# Patient Record
Sex: Female | Born: 1970 | Race: Black or African American | Hispanic: No | Marital: Single | Smoking: Current every day smoker
Health system: Southern US, Community
[De-identification: ages and names within clinical notes are randomized; demographics above are authoritative.]

## PROBLEM LIST (undated history)

## (undated) DIAGNOSIS — I350 Nonrheumatic aortic (valve) stenosis: Secondary | ICD-10-CM

## (undated) DIAGNOSIS — R609 Edema, unspecified: Secondary | ICD-10-CM

## (undated) DIAGNOSIS — I359 Nonrheumatic aortic valve disorder, unspecified: Secondary | ICD-10-CM

## (undated) DIAGNOSIS — B351 Tinea unguium: Secondary | ICD-10-CM

## (undated) DIAGNOSIS — I10 Essential (primary) hypertension: Secondary | ICD-10-CM

## (undated) DIAGNOSIS — E119 Type 2 diabetes mellitus without complications: Secondary | ICD-10-CM

## (undated) HISTORY — DX: Edema, unspecified: R60.9

## (undated) HISTORY — DX: Tinea unguium: B35.1

## (undated) HISTORY — DX: Nonrheumatic aortic valve disorder, unspecified: I35.9

## (undated) HISTORY — DX: Nonrheumatic aortic (valve) stenosis: I35.0

## (undated) HISTORY — DX: Morbid (severe) obesity due to excess calories: E66.01

## (undated) HISTORY — DX: Type 2 diabetes mellitus without complications: E11.9

## (undated) HISTORY — DX: Essential (primary) hypertension: I10

---

## 2000-08-25 ENCOUNTER — Emergency Department (HOSPITAL_COMMUNITY): Admission: EM | Admit: 2000-08-25 | Discharge: 2000-08-25 | Payer: Self-pay | Admitting: *Deleted

## 2000-08-26 ENCOUNTER — Emergency Department (HOSPITAL_COMMUNITY): Admission: EM | Admit: 2000-08-26 | Discharge: 2000-08-26 | Payer: Self-pay | Admitting: *Deleted

## 2000-10-12 ENCOUNTER — Encounter: Payer: Self-pay | Admitting: *Deleted

## 2000-10-12 ENCOUNTER — Emergency Department (HOSPITAL_COMMUNITY): Admission: EM | Admit: 2000-10-12 | Discharge: 2000-10-12 | Payer: Self-pay | Admitting: *Deleted

## 2001-07-04 ENCOUNTER — Encounter (HOSPITAL_COMMUNITY): Admission: RE | Admit: 2001-07-04 | Discharge: 2001-08-03 | Payer: Self-pay | Admitting: Internal Medicine

## 2001-07-18 ENCOUNTER — Ambulatory Visit (HOSPITAL_COMMUNITY): Admission: RE | Admit: 2001-07-18 | Discharge: 2001-07-18 | Payer: Self-pay | Admitting: Otolaryngology

## 2001-07-18 ENCOUNTER — Encounter: Payer: Self-pay | Admitting: Otolaryngology

## 2001-08-02 ENCOUNTER — Encounter: Payer: Self-pay | Admitting: Otolaryngology

## 2001-08-05 ENCOUNTER — Encounter (HOSPITAL_COMMUNITY): Admission: RE | Admit: 2001-08-05 | Discharge: 2001-09-04 | Payer: Self-pay | Admitting: Internal Medicine

## 2001-08-07 ENCOUNTER — Encounter (INDEPENDENT_AMBULATORY_CARE_PROVIDER_SITE_OTHER): Payer: Self-pay | Admitting: *Deleted

## 2001-08-07 ENCOUNTER — Ambulatory Visit (HOSPITAL_COMMUNITY): Admission: RE | Admit: 2001-08-07 | Discharge: 2001-08-08 | Payer: Self-pay | Admitting: Otolaryngology

## 2004-03-30 ENCOUNTER — Emergency Department (HOSPITAL_COMMUNITY): Admission: EM | Admit: 2004-03-30 | Discharge: 2004-03-30 | Payer: Self-pay | Admitting: *Deleted

## 2005-12-29 ENCOUNTER — Ambulatory Visit: Payer: Self-pay | Admitting: Family Medicine

## 2006-01-02 ENCOUNTER — Encounter: Payer: Self-pay | Admitting: Family Medicine

## 2006-01-12 ENCOUNTER — Ambulatory Visit: Payer: Self-pay | Admitting: Family Medicine

## 2006-01-26 ENCOUNTER — Ambulatory Visit: Payer: Self-pay | Admitting: Family Medicine

## 2006-01-26 LAB — CONVERTED CEMR LAB
BUN: 19 mg/dL (ref 6–23)
CO2: 19 meq/L (ref 19–32)
Calcium: 9.4 mg/dL (ref 8.4–10.5)
Chloride: 100 meq/L (ref 96–112)
Creatinine, Ser: 0.59 mg/dL (ref 0.40–1.20)
Glucose, Bld: 115 mg/dL — ABNORMAL HIGH (ref 70–99)
Potassium: 4.1 meq/L (ref 3.5–5.3)
Sodium: 135 meq/L (ref 135–145)

## 2006-02-23 ENCOUNTER — Ambulatory Visit: Payer: Self-pay | Admitting: Family Medicine

## 2006-02-24 ENCOUNTER — Encounter (INDEPENDENT_AMBULATORY_CARE_PROVIDER_SITE_OTHER): Payer: Self-pay | Admitting: Family Medicine

## 2006-02-24 LAB — CONVERTED CEMR LAB
Creatinine, Urine: 89.3 mg/dL
Microalb Creat Ratio: 8.7 mg/g (ref 0.0–30.0)
Microalb, Ur: 0.78 mg/dL (ref 0.00–1.89)

## 2006-03-05 ENCOUNTER — Ambulatory Visit: Payer: Self-pay | Admitting: Family Medicine

## 2006-03-05 ENCOUNTER — Telehealth (INDEPENDENT_AMBULATORY_CARE_PROVIDER_SITE_OTHER): Payer: Self-pay | Admitting: Family Medicine

## 2006-03-05 DIAGNOSIS — I152 Hypertension secondary to endocrine disorders: Secondary | ICD-10-CM

## 2006-03-05 DIAGNOSIS — B351 Tinea unguium: Secondary | ICD-10-CM | POA: Insufficient documentation

## 2006-03-05 DIAGNOSIS — E1159 Type 2 diabetes mellitus with other circulatory complications: Secondary | ICD-10-CM

## 2006-03-05 DIAGNOSIS — E1121 Type 2 diabetes mellitus with diabetic nephropathy: Secondary | ICD-10-CM

## 2006-03-05 HISTORY — DX: Type 2 diabetes mellitus with other circulatory complications: E11.59

## 2006-03-05 HISTORY — DX: Hypertension secondary to endocrine disorders: I15.2

## 2006-03-15 ENCOUNTER — Ambulatory Visit: Payer: Self-pay | Admitting: Family Medicine

## 2006-03-26 ENCOUNTER — Ambulatory Visit: Payer: Self-pay | Admitting: Internal Medicine

## 2006-04-03 ENCOUNTER — Encounter (INDEPENDENT_AMBULATORY_CARE_PROVIDER_SITE_OTHER): Payer: Self-pay | Admitting: Family Medicine

## 2006-04-03 ENCOUNTER — Ambulatory Visit: Payer: Self-pay | Admitting: Internal Medicine

## 2006-04-03 ENCOUNTER — Ambulatory Visit (HOSPITAL_COMMUNITY): Admission: RE | Admit: 2006-04-03 | Discharge: 2006-04-03 | Payer: Self-pay | Admitting: Internal Medicine

## 2006-04-11 ENCOUNTER — Encounter (INDEPENDENT_AMBULATORY_CARE_PROVIDER_SITE_OTHER): Payer: Self-pay | Admitting: Family Medicine

## 2006-04-17 ENCOUNTER — Encounter (INDEPENDENT_AMBULATORY_CARE_PROVIDER_SITE_OTHER): Payer: Self-pay | Admitting: Family Medicine

## 2006-04-24 ENCOUNTER — Ambulatory Visit: Payer: Self-pay | Admitting: Family Medicine

## 2006-04-24 DIAGNOSIS — I10 Essential (primary) hypertension: Secondary | ICD-10-CM | POA: Insufficient documentation

## 2006-04-24 LAB — CONVERTED CEMR LAB
Glucose, Bld: 131 mg/dL
Hgb A1c MFr Bld: 6.1 %

## 2006-05-02 ENCOUNTER — Ambulatory Visit: Payer: Self-pay | Admitting: Internal Medicine

## 2006-05-02 ENCOUNTER — Encounter (INDEPENDENT_AMBULATORY_CARE_PROVIDER_SITE_OTHER): Payer: Self-pay | Admitting: Family Medicine

## 2006-07-24 ENCOUNTER — Ambulatory Visit: Payer: Self-pay | Admitting: Family Medicine

## 2006-07-24 DIAGNOSIS — R609 Edema, unspecified: Secondary | ICD-10-CM

## 2006-07-24 LAB — CONVERTED CEMR LAB
Cholesterol, target level: 200 mg/dL
Glucose, Bld: 147 mg/dL
HDL goal, serum: 40 mg/dL
Hgb A1c MFr Bld: 6.6 %
LDL Goal: 100 mg/dL

## 2006-07-25 ENCOUNTER — Telehealth (INDEPENDENT_AMBULATORY_CARE_PROVIDER_SITE_OTHER): Payer: Self-pay | Admitting: *Deleted

## 2006-07-25 ENCOUNTER — Encounter (INDEPENDENT_AMBULATORY_CARE_PROVIDER_SITE_OTHER): Payer: Self-pay | Admitting: Family Medicine

## 2006-07-25 LAB — CONVERTED CEMR LAB
BUN: 17 mg/dL (ref 6–23)
Calcium: 9.6 mg/dL (ref 8.4–10.5)
Potassium: 4.1 meq/L (ref 3.5–5.3)
Sodium: 140 meq/L (ref 135–145)

## 2006-07-31 ENCOUNTER — Ambulatory Visit (HOSPITAL_COMMUNITY): Admission: RE | Admit: 2006-07-31 | Discharge: 2006-07-31 | Payer: Self-pay | Admitting: Family Medicine

## 2006-08-01 ENCOUNTER — Telehealth (INDEPENDENT_AMBULATORY_CARE_PROVIDER_SITE_OTHER): Payer: Self-pay | Admitting: *Deleted

## 2006-08-01 ENCOUNTER — Encounter (INDEPENDENT_AMBULATORY_CARE_PROVIDER_SITE_OTHER): Payer: Self-pay | Admitting: Family Medicine

## 2007-06-12 ENCOUNTER — Telehealth (INDEPENDENT_AMBULATORY_CARE_PROVIDER_SITE_OTHER): Payer: Self-pay | Admitting: *Deleted

## 2007-07-11 ENCOUNTER — Ambulatory Visit: Payer: Self-pay | Admitting: Family Medicine

## 2007-07-11 DIAGNOSIS — K056 Periodontal disease, unspecified: Secondary | ICD-10-CM | POA: Insufficient documentation

## 2007-07-11 DIAGNOSIS — K069 Disorder of gingiva and edentulous alveolar ridge, unspecified: Secondary | ICD-10-CM

## 2007-07-11 LAB — CONVERTED CEMR LAB
Bilirubin Urine: NEGATIVE
Blood Glucose, Fasting: 436 mg/dL
Blood in Urine, dipstick: NEGATIVE
Hgb A1c MFr Bld: 11.2 %
Protein, U semiquant: NEGATIVE
Specific Gravity, Urine: 1.01
Urobilinogen, UA: 0.2
WBC Urine, dipstick: NEGATIVE

## 2007-07-12 LAB — CONVERTED CEMR LAB
ALT: 25 units/L (ref 0–35)
AST: 24 units/L (ref 0–37)
Alkaline Phosphatase: 94 units/L (ref 39–117)
BUN: 10 mg/dL (ref 6–23)
Basophils Absolute: 0 10*3/uL (ref 0.0–0.1)
Basophils Relative: 1 % (ref 0–1)
Calcium: 9.9 mg/dL (ref 8.4–10.5)
Creatinine, Ser: 0.7 mg/dL (ref 0.40–1.20)
Creatinine, Urine: 54.3 mg/dL
Eosinophils Absolute: 0.1 10*3/uL (ref 0.0–0.7)
Eosinophils Relative: 1 % (ref 0–5)
HDL: 52 mg/dL (ref >39)
Hemoglobin: 13 g/dL (ref 12.0–15.0)
LDL Cholesterol: 96 mg/dL (ref 0–99)
MCHC: 33 g/dL (ref 30.0–36.0)
MCV: 86 fL (ref 78.0–100.0)
Monocytes Absolute: 0.4 10*3/uL (ref 0.1–1.0)
Monocytes Relative: 7 % (ref 3–12)
Neutro Abs: 4 10*3/uL (ref 1.7–7.7)
RBC: 4.58 M/uL (ref 3.87–5.11)
RDW: 13.6 % (ref 11.5–15.5)
TSH: 1.394 microintl units/mL (ref 0.350–5.50)
Total Bilirubin: 0.5 mg/dL (ref 0.3–1.2)
Total CHOL/HDL Ratio: 3.3
VLDL: 22 mg/dL (ref 0–40)

## 2007-07-18 ENCOUNTER — Ambulatory Visit: Payer: Self-pay | Admitting: Family Medicine

## 2007-07-18 DIAGNOSIS — N912 Amenorrhea, unspecified: Secondary | ICD-10-CM

## 2007-07-18 LAB — CONVERTED CEMR LAB
BUN: 27 mg/dL — ABNORMAL HIGH (ref 6–23)
Beta hcg, urine, semiquantitative: NEGATIVE
Creatinine, Ser: 1.12 mg/dL (ref 0.40–1.20)
Glucose, Bld: 495 mg/dL — ABNORMAL HIGH (ref 70–99)
Ketones, urine, test strip: NEGATIVE
Nitrite: NEGATIVE
Potassium: 4.1 meq/L (ref 3.5–5.3)
Specific Gravity, Urine: 1.01
Urobilinogen, UA: 0.2

## 2007-07-22 ENCOUNTER — Telehealth (INDEPENDENT_AMBULATORY_CARE_PROVIDER_SITE_OTHER): Payer: Self-pay | Admitting: Family Medicine

## 2007-07-23 ENCOUNTER — Encounter (INDEPENDENT_AMBULATORY_CARE_PROVIDER_SITE_OTHER): Payer: Self-pay | Admitting: Family Medicine

## 2007-07-25 ENCOUNTER — Ambulatory Visit: Payer: Self-pay | Admitting: Family Medicine

## 2007-07-25 LAB — CONVERTED CEMR LAB: Glucose, Bld: 410 mg/dL

## 2007-08-22 ENCOUNTER — Ambulatory Visit: Payer: Self-pay | Admitting: Family Medicine

## 2007-08-22 DIAGNOSIS — K649 Unspecified hemorrhoids: Secondary | ICD-10-CM | POA: Insufficient documentation

## 2007-08-22 DIAGNOSIS — R809 Proteinuria, unspecified: Secondary | ICD-10-CM | POA: Insufficient documentation

## 2007-08-29 LAB — CONVERTED CEMR LAB
BUN: 31 mg/dL — ABNORMAL HIGH (ref 6–23)
Chloride: 106 meq/L (ref 96–112)

## 2007-10-03 ENCOUNTER — Ambulatory Visit: Payer: Self-pay | Admitting: Family Medicine

## 2007-10-03 LAB — CONVERTED CEMR LAB: Hgb A1c MFr Bld: 7.7 %

## 2007-10-03 LAB — HM DIABETES EYE EXAM

## 2008-02-12 ENCOUNTER — Encounter (INDEPENDENT_AMBULATORY_CARE_PROVIDER_SITE_OTHER): Payer: Self-pay | Admitting: Family Medicine

## 2008-10-12 ENCOUNTER — Ambulatory Visit: Payer: Self-pay | Admitting: Family Medicine

## 2008-10-12 LAB — CONVERTED CEMR LAB
Blood Glucose, Fasting: 392 mg/dL
Hgb A1c MFr Bld: 12.4 %

## 2008-10-13 ENCOUNTER — Encounter (INDEPENDENT_AMBULATORY_CARE_PROVIDER_SITE_OTHER): Payer: Self-pay | Admitting: Family Medicine

## 2009-04-05 ENCOUNTER — Ambulatory Visit: Payer: Self-pay | Admitting: Family Medicine

## 2009-04-05 DIAGNOSIS — R5381 Other malaise: Secondary | ICD-10-CM | POA: Insufficient documentation

## 2009-04-05 DIAGNOSIS — B369 Superficial mycosis, unspecified: Secondary | ICD-10-CM | POA: Insufficient documentation

## 2009-04-05 DIAGNOSIS — G4733 Obstructive sleep apnea (adult) (pediatric): Secondary | ICD-10-CM | POA: Insufficient documentation

## 2009-04-05 DIAGNOSIS — R5383 Other fatigue: Secondary | ICD-10-CM

## 2009-04-05 DIAGNOSIS — G4730 Sleep apnea, unspecified: Secondary | ICD-10-CM

## 2009-04-05 LAB — CONVERTED CEMR LAB: Glucose, Bld: 210 mg/dL

## 2009-04-06 ENCOUNTER — Encounter: Payer: Self-pay | Admitting: Family Medicine

## 2009-04-06 LAB — CONVERTED CEMR LAB
Creatinine, Urine: 93.4 mg/dL
Microalb, Ur: 0.5 mg/dL (ref 0.00–1.89)

## 2009-04-07 ENCOUNTER — Encounter: Payer: Self-pay | Admitting: Family Medicine

## 2009-04-07 LAB — CONVERTED CEMR LAB
ALT: 12 units/L (ref 0–35)
Albumin: 4 g/dL (ref 3.5–5.2)
Basophils Absolute: 0 10*3/uL (ref 0.0–0.1)
Chloride: 102 meq/L (ref 96–112)
Cholesterol: 173 mg/dL (ref 0–200)
HCT: 35.1 % — ABNORMAL LOW (ref 36.0–46.0)
HDL: 44 mg/dL (ref >39)
LDL Cholesterol: 113 mg/dL — ABNORMAL HIGH (ref 0–99)
Lymphocytes Relative: 28 % (ref 12–46)
Lymphs Abs: 2.3 10*3/uL (ref 0.7–4.0)
Neutro Abs: 4.9 10*3/uL (ref 1.7–7.7)
Neutrophils Relative %: 61 % (ref 43–77)
Platelets: 311 10*3/uL (ref 150–400)
Potassium: 4.3 meq/L (ref 3.5–5.3)
RDW: 14.1 % (ref 11.5–15.5)
Sodium: 138 meq/L (ref 135–145)
Total Protein: 7.8 g/dL (ref 6.0–8.3)
Triglycerides: 78 mg/dL (ref <150)
VLDL: 16 mg/dL (ref 0–40)
WBC: 8 10*3/uL (ref 4.0–10.5)

## 2009-04-12 ENCOUNTER — Ambulatory Visit (HOSPITAL_COMMUNITY): Admission: RE | Admit: 2009-04-12 | Discharge: 2009-04-12 | Payer: Self-pay | Admitting: Family Medicine

## 2009-04-14 ENCOUNTER — Encounter: Payer: Self-pay | Admitting: Family Medicine

## 2009-04-21 ENCOUNTER — Encounter: Payer: Self-pay | Admitting: Family Medicine

## 2009-05-03 ENCOUNTER — Encounter (INDEPENDENT_AMBULATORY_CARE_PROVIDER_SITE_OTHER): Payer: Self-pay | Admitting: Cardiology

## 2009-05-03 ENCOUNTER — Encounter (HOSPITAL_COMMUNITY): Admission: RE | Admit: 2009-05-03 | Discharge: 2009-06-02 | Payer: Self-pay | Admitting: Cardiology

## 2009-05-06 ENCOUNTER — Ambulatory Visit: Payer: Self-pay | Admitting: Family Medicine

## 2009-05-06 DIAGNOSIS — E119 Type 2 diabetes mellitus without complications: Secondary | ICD-10-CM

## 2009-05-06 DIAGNOSIS — E785 Hyperlipidemia, unspecified: Secondary | ICD-10-CM | POA: Insufficient documentation

## 2009-05-06 LAB — CONVERTED CEMR LAB: Blood Glucose, Fasting: 144 mg/dL

## 2009-05-11 ENCOUNTER — Encounter: Payer: Self-pay | Admitting: Family Medicine

## 2009-07-09 ENCOUNTER — Encounter: Payer: Self-pay | Admitting: Family Medicine

## 2009-07-12 LAB — CONVERTED CEMR LAB: Hgb A1c MFr Bld: 6.7 % — ABNORMAL HIGH (ref <5.7)

## 2009-07-15 ENCOUNTER — Other Ambulatory Visit: Admission: RE | Admit: 2009-07-15 | Discharge: 2009-07-15 | Payer: Self-pay | Admitting: Family Medicine

## 2009-07-15 ENCOUNTER — Ambulatory Visit: Payer: Self-pay | Admitting: Family Medicine

## 2009-07-15 LAB — HM DIABETES FOOT EXAM

## 2009-07-21 ENCOUNTER — Encounter: Payer: Self-pay | Admitting: Family Medicine

## 2009-07-21 LAB — CONVERTED CEMR LAB: Pap Smear: NEGATIVE

## 2009-09-29 ENCOUNTER — Ambulatory Visit: Payer: Self-pay | Admitting: Family Medicine

## 2009-09-29 LAB — CONVERTED CEMR LAB
ALT: 12 units/L (ref 0–35)
BUN: 28 mg/dL — ABNORMAL HIGH (ref 6–23)
Bilirubin, Direct: 0.1 mg/dL (ref 0.0–0.3)
CO2: 23 meq/L (ref 19–32)
Eosinophils Absolute: 0.2 10*3/uL (ref 0.0–0.7)
Glucose, Bld: 81 mg/dL (ref 70–99)
HCT: 33.9 % — ABNORMAL LOW (ref 36.0–46.0)
Hgb A1c MFr Bld: 6.7 % — ABNORMAL HIGH (ref <5.7)
Indirect Bilirubin: 0.4 mg/dL (ref 0.0–0.9)
LDL Cholesterol: 123 mg/dL — ABNORMAL HIGH (ref 0–99)
Lymphocytes Relative: 37 % (ref 12–46)
Lymphs Abs: 2.8 10*3/uL (ref 0.7–4.0)
MCV: 89.4 fL (ref 78.0–100.0)
Monocytes Relative: 7 % (ref 3–12)
Neutrophils Relative %: 53 % (ref 43–77)
Platelets: 358 10*3/uL (ref 150–400)
Potassium: 4.7 meq/L (ref 3.5–5.3)
RBC: 3.79 M/uL — ABNORMAL LOW (ref 3.87–5.11)
Sodium: 139 meq/L (ref 135–145)
Total Bilirubin: 0.5 mg/dL (ref 0.3–1.2)
Total CHOL/HDL Ratio: 3.7
VLDL: 15 mg/dL (ref 0–40)
WBC: 7.5 10*3/uL (ref 4.0–10.5)

## 2009-12-26 LAB — CONVERTED CEMR LAB
AST: 11 units/L (ref 0–37)
Albumin: 3.9 g/dL (ref 3.5–5.2)
CO2: 22 meq/L (ref 19–32)
Calcium: 9.3 mg/dL (ref 8.4–10.5)
Chloride: 105 meq/L (ref 96–112)
Glucose, Bld: 188 mg/dL — ABNORMAL HIGH (ref 70–99)
HDL: 48 mg/dL (ref >39)
Hgb A1c MFr Bld: 9.1 % — ABNORMAL HIGH (ref <5.7)
Potassium: 4.5 meq/L (ref 3.5–5.3)
Sodium: 139 meq/L (ref 135–145)
Total Bilirubin: 0.5 mg/dL (ref 0.3–1.2)
Total CHOL/HDL Ratio: 3.4
VLDL: 15 mg/dL (ref 0–40)

## 2009-12-29 ENCOUNTER — Ambulatory Visit: Payer: Self-pay | Admitting: Family Medicine

## 2010-02-22 NOTE — Assessment & Plan Note (Signed)
Summary: physical   Vital Signs:  Patient profile:   40 year old female Menstrual status:  irregular Height:      64 inches Weight:      362 pounds BMI:     62.36 O2 Sat:      95 % Pulse rate:   88 / minute Pulse rhythm:   regular Resp:     16 per minute BP sitting:   108 / 70  (left arm) Cuff size:   xl  Vitals Entered By: Everitt Amber LPN (July 15, 2009 8:53 AM)  Nutrition Counseling: Patient's BMI is greater than 25 and therefore counseled on weight management options. CC: CPE    Primary Care Provider:  Syliva Overman MD  CC:  CPE .  History of Present Illness: Reports  that she has been  doing well. Denies recent fever or chills. Denies sinus pressure, nasal congestion , ear pain or sore throat. Denies chest congestion, or cough productive of sputum. Denies chest pain, palpitations, PND, orthopnea or leg swelling. Denies abdominal pain, nausea, vomitting, diarrhea or constipation. Denies change in bowel movements or bloody stool. Denies dysuria , frequency, incontinence or hesitancy. Denies  joint pain, swelling, or reduced mobility. Denies headaches, vertigo, seizures. Denies depression, anxiety or insomnia. Denies  rash, lesions, or itch. She has been testing her sugars daily and reports marked improvement, wit tolerance to her meds and no hypoglycemic episodes. She ahs also modified her diet with a n impressive weight loss. Regular exercise needs to be adopted     Current Medications (verified): 1)  Lisinopril 40 Mg  Tabs (Lisinopril) .... One Daily 2)  Hydrochlorothiazide 25 Mg  Tabs (Hydrochlorothiazide) .... One Daily 3)  Ascensia Autodisk Glusose Strips With Supplies .... Test Fsbs Once Daily 4)  Janumet 50-1000 Mg Tabs (Sitagliptin-Metformin Hcl) .... Take 1 Tablet By Mouth Two Times A Day 5)  Clotrimazole-Betamethasone 1-0.05 % Crea (Clotrimazole-Betamethasone) .... Apply Twice Daily To Affected Areas 6)  Lancets and Strips .... Two Times A Day  Testing 7)  Lovastatin 40 Mg Tabs (Lovastatin) .... Take 1 Tab By Mouth At Bedtime 8)  Glimepiride 4 Mg Tabs (Glimepiride) .... Take 1 Tablet By Mouth Two Times A Day 9)  Slow Release Iron 160 (50 Fe) Mg Cr-Tabs (Ferrous Sulfate Dried) .... Take 1 Tablet By Mouth Once A Day  Allergies (verified): No Known Drug Allergies  Review of Systems      See HPI Eyes:  Denies blurring and discharge. Endo:  Denies cold intolerance, excessive hunger, excessive thirst, excessive urination, heat intolerance, polyuria, and weight change. Heme:  Denies abnormal bruising and bleeding. Allergy:  Denies hives or rash and itching eyes.  Physical Exam  General:  Well-developed,morbidly obese,in no acute distress; alert,appropriate and cooperative throughout examination Head:  Normocephalic and atraumatic without obvious abnormalities. No apparent alopecia or balding. Eyes:  No corneal or conjunctival inflammation noted. EOMI. Perrla. Funduscopic exam benign, without hemorrhages, exudates or papilledema. Vision grossly normal. Ears:  External ear exam shows no significant lesions or deformities.  Otoscopic examination reveals clear canals, tympanic membranes are intact bilaterally without bulging, retraction, inflammation or discharge. Hearing is grossly normal bilaterally. Nose:  External nasal examination shows no deformity or inflammation. Nasal mucosa are pink and moist without lesions or exudates. Mouth:  Oral mucosa and oropharynx without lesions or exudates.  Teeth in good repair. Neck:  No deformities, masses, or tenderness noted. Chest Wall:  No deformities, masses, or tenderness noted. Breasts:  No mass, nodules, thickening, tenderness,  bulging, retraction, inflamation, nipple discharge or skin changes noted.   Lungs:  Normal respiratory effort, chest expands symmetrically. Lungs are clear to auscultation, no crackles or wheezes. Heart:  Normal rate and regular rhythm. S1 and S2 normal without gallop,  murmur, click, rub or other extra sounds. Abdomen:  Bowel sounds positive,abdomen soft and non-tender without masses, organomegaly or hernias noted. Rectal:  No external abnormalities noted. Normal sphincter tone. No rectal masses or tenderness.Guaic neg stool Genitalia:  Normal introitus for age, no external lesions, no vaginal discharge, mucosa pink and moist, no vaginal or cervical lesions, no vaginal atrophy, no friaility or hemorrhage, normal uterus size and position, no adnexal masses or tenderness Msk:  No deformity or scoliosis noted of thoracic or lumbar spine.   Pulses:  R and L carotid,radial,femoral,dorsalis pedis and posterior tibial pulses are full and equal bilaterally Extremities:  No clubbing, cyanosis, edema, or deformity noted with normal full range of motion of all joints.   Neurologic:  No cranial nerve deficits noted. Station and gait are normal. Plantar reflexes are down-going bilaterally. DTRs are symmetrical throughout. Sensory, motor and coordinative functions appear intact.  Diabetes Management Exam:    Foot Exam (with socks and/or shoes not present):       Sensory-Monofilament:          Left foot: normal          Right foot: normal       Inspection:          Left foot: normal          Right foot: normal       Nails:          Left foot: thickened          Right foot: thickened   Impression & Recommendations:  Problem # 1:  DIABETES MELLITUS, TYPE II (ICD-250.00) Assessment Improved  Her updated medication list for this problem includes:    Lisinopril 40 Mg Tabs (Lisinopril) ..... One daily    Janumet 50-1000 Mg Tabs (Sitagliptin-metformin hcl) .Marland Kitchen... Take 1 tablet by mouth two times a day    Glimepiride 4 Mg Tabs (Glimepiride) .Marland Kitchen... Take 1 tablet by mouth two times a day  Orders: T- Hemoglobin A1C ([id number])  Labs Reviewed: Creat: 0.73 (04/06/2009)     Last Eye Exam: Advised (10/03/2007) Reviewed HgBA1c results: 6.7 (07/09/2009)  8.6  (04/07/2009)  Problem # 2:  HYPERLIPIDEMIA (ICD-272.4) Assessment: Comment Only  Her updated medication list for this problem includes:    Lovastatin 40 Mg Tabs (Lovastatin) .Marland Kitchen... Take 1 tab by mouth at bedtime  Orders: T-Hepatic Function [id number]) T-Lipid Profile [id number])  Labs Reviewed: SGOT: 12 (04/06/2009)   SGPT: 12 (04/06/2009)  Lipid Goals: Chol Goal: 200 (07/24/2006)   HDL Goal: 40 (07/24/2006)   LDL Goal: 100 (07/24/2006)   TG Goal: 150 (07/24/2006)  Prior 10 Yr Risk Heart Disease: 2 % (07/18/2007)   HDL:44 (04/06/2009), 52 (07/11/2007)  LDL:113 (04/06/2009), 96 (57/84/6962)  Chol:173 (04/06/2009), 170 (07/11/2007)  Trig:78 (04/06/2009), 109 (07/11/2007)  Problem # 3:  ESSENTIAL HYPERTENSION (ICD-401.9) Assessment: Improved  Her updated medication list for this problem includes:    Lisinopril 40 Mg Tabs (Lisinopril) ..... One daily    Hydrochlorothiazide 25 Mg Tabs (Hydrochlorothiazide) ..... One daily  Orders: T-Basic Metabolic Panel [id number])  BP today: 108/70 Prior BP: 118/72 (05/06/2009)  Prior 10 Yr Risk Heart Disease: 2 % (07/18/2007)  Labs Reviewed: K+: 4.3 (04/06/2009) Creat: : 0.73 (04/06/2009)   Chol: 173 (04/06/2009)   HDL:  44 (04/06/2009)   LDL: 113 (04/06/2009)   TG: 78 (04/06/2009)  Problem # 4:  MORBID OBESITY (ICD-278.01) Assessment: Improved  Ht: 64 (07/15/2009)   Wt: 362 (07/15/2009)   BMI: 62.36 (07/15/2009)  Complete Medication List: 1)  Lisinopril 40 Mg Tabs (Lisinopril) .... One daily 2)  Hydrochlorothiazide 25 Mg Tabs (Hydrochlorothiazide) .... One daily 3)  Ascensia Autodisk Glusose Strips With [employer]  .... Test fsbs once daily 4)  Janumet 50-1000 Mg Tabs (Sitagliptin-metformin hcl) .... Take 1 tablet by mouth two times a day 5)  Clotrimazole-betamethasone 1-0.05 % Crea (Clotrimazole-betamethasone) .... Apply twice daily to affected areas 6)  Lancets and Strips  .... Two times a day testing 7)  Lovastatin 40 Mg Tabs  (Lovastatin) .... Take 1 tab by mouth at bedtime 8)  Glimepiride 4 Mg Tabs (Glimepiride) .... Take 1 tablet by mouth two times a day 9)  Slow Release Iron 160 (50 Fe) Mg Cr-tabs (Ferrous sulfate dried) .... Take 1 tablet by mouth once a day  Other Orders: T-CBC w/Diff ([id number]) Pelvic & Breast Exam ( Medicare)  (G0101) Hemoccult Guaiac-1 spec.(in office) (09811)  Patient Instructions: 1)  Please schedule a follow-up appointment in 3 months. 2)  ' 3)  CONGRATS 4)  BMP prior to visit, ICD-9: 5)  Hepatic Panel prior to visit, ICD-9: 6)  Lipid Panel prior to visit, ICD-9:  fastinfg in 3 months 7)  CBC w/ Diff prior to visit, ICD-9: 8)  HbgA1C prior to visit, ICD-9: 9)  No med changes 10)  It is important that you exercise regularly at least 30 minutes 5 times a week. If you develop chest pain, have severe difficulty breathing, or feel very tired , stop exercising immediately and seek medical attention. 11)  You need to lose weight. Consider a lower calorie diet and regular exercise.  12)  Check your blood sugars regularly. If your readings are usually above : or below 70 you should contact our office. Prescriptions: JANUMET 50-1000 MG TABS (SITAGLIPTIN-METFORMIN HCL) Take 1 tablet by mouth two times a day  #60 x 4   Entered by:   Everitt Amber LPN   Authorized by:   Syliva Overman MD   Signed by:   Everitt Amber LPN on 91/47/8295   Method used:   Electronically to        [facility]  [address] (retail)       [address]       [place], [place]  [address]       Ph: [phone]       Fax: [phone]   RxID:   [id number] LISINOPRIL 40 MG  TABS (LISINOPRIL) One daily  #30 x 4   Entered by:   Everitt Amber LPN   Authorized by:   Syliva Overman MD   Signed by:   Everitt Amber LPN on 42/59/5638   Method used:   Electronically to        [facility]  [address] (retail)       [address]       [place], [place]  [address]       Ph: [phone]        Fax: [phone]   RxID:   [id number]   Laboratory Results    Stool - Occult Blood Hemmoccult #1: negative Date: 07/15/2009 Comments: [id number] 9r 8/11 18 1012

## 2010-02-22 NOTE — Letter (Signed)
Summary: Letter  Letter   Imported By: Lind Guest 04/21/2009 14:29:50  _____________________________________________________________________  External Attachment:    Type:   Image     Comment:   External Document

## 2010-02-22 NOTE — Assessment & Plan Note (Signed)
Summary: office visit   Vital Signs:  Patient profile:   40 year old female Menstrual status:  irregular Height:      64 inches Weight:      366 pounds BMI:     63.05 O2 Sat:      98 % Pulse rate:   80 / minute Pulse rhythm:   regular Resp:     16 per minute BP sitting:   118 / 74  (left arm) Cuff size:   xl  Vitals Entered By: Everitt Amber LPN (September 29, 2009 8:55 AM)  Nutrition Counseling: Patient's BMI is greater than 25 and therefore counseled on weight management options. CC: Folllow up chronic problems   Primary Care Provider:  Syliva Overman MD  CC:  Folllow up chronic problems.  History of Present Illness: Reports  that she has been  doing well.She has been working extra shifts and is tired, her mother has been ill, but is doing better. Denies recent fever or chills. Denies sinus pressure, nasal congestion , ear pain or sore throat. Denies chest congestion, or cough productive of sputum. Denies chest pain, palpitations, PND, orthopnea or leg swelling. Denies abdominal pain, nausea, vomitting, diarrhea or constipation. Denies change in bowel movements or bloody stool. Denies dysuria , frequency, incontinence or hesitancy. Denies  joint pain, swelling, or reduced mobility. Denies headaches, vertigo, seizures. Denies depression, anxiety or insomnia. Denies  rash, lesions, or itch.     Current Medications (verified): 1)  Lisinopril 40 Mg  Tabs (Lisinopril) .... One Daily 2)  Hydrochlorothiazide 25 Mg  Tabs (Hydrochlorothiazide) .... One Daily 3)  Ascensia Autodisk Glusose Strips With Supplies .... Test Fsbs Once Daily 4)  Janumet 50-1000 Mg Tabs (Sitagliptin-Metformin Hcl) .... Take 1 Tablet By Mouth Two Times A Day 5)  Clotrimazole-Betamethasone 1-0.05 % Crea (Clotrimazole-Betamethasone) .... Apply Twice Daily To Affected Areas 6)  Lancets and Strips .... Two Times A Day Testing 7)  Lovastatin 40 Mg Tabs (Lovastatin) .... Take 1 Tab By Mouth At Bedtime 8)   Glimepiride 4 Mg Tabs (Glimepiride) .... Take 1 Tablet By Mouth Two Times A Day 9)  Slow Release Iron 160 (50 Fe) Mg Cr-Tabs (Ferrous Sulfate Dried) .... Take 1 Tablet By Mouth Once A Day  Allergies (verified): No Known Drug Allergies  Review of Systems      See HPI General:  Complains of fatigue. Eyes:  Denies blurring, discharge, eye pain, and red eye. MS:  Complains of joint pain; bilateral kn ee pain after a long period ofstanding. Endo:  Denies excessive thirst and excessive urination; fasting sugars are seldom over 130. Heme:  Denies abnormal bruising and bleeding. Allergy:  Denies hives or rash and itching eyes.  Physical Exam  General:  Well-developed,morbidly obese,in no acute distress; alert,appropriate and cooperative throughout examination HEENT: No facial asymmetry,  EOMI, No sinus tenderness, TM's Clear, oropharynx  pink and moist.   Chest: Clear to auscultation bilaterally.  CVS: S1, S2, No murmurs, No S3.   Abd: Soft, Nontender.  MS: Adequate ROM spine, hips, shoulders and knees.  Ext: No edema.   CNS: CN 2-12 intact, power tone and sensation normal throughout.   Skin: Intact, no visible lesions or rashes.  Psych: Good eye contact, normal affect.  Memory intact, not anxious or depressed appearing.    Impression & Recommendations:  Problem # 1:  DIABETES MELLITUS, TYPE II (ICD-250.00) Assessment Unchanged  Her updated medication list for this problem includes:    Lisinopril 40 Mg Tabs (Lisinopril) .Marland KitchenMarland KitchenMarland KitchenMarland Kitchen  One daily    Janumet 50-1000 Mg Tabs (Sitagliptin-metformin hcl) .Marland Kitchen... Take 1 tablet by mouth two times a day    Glimepiride 4 Mg Tabs (Glimepiride) .Marland Kitchen... Take 1 tablet by mouth two times a day  Labs Reviewed: Creat: 1.16 (09/24/2009)     Last Eye Exam: Advised (10/03/2007) Reviewed HgBA1c results: 6.7 (09/24/2009)  6.7 (07/09/2009)  Problem # 2:  HYPERLIPIDEMIA (ICD-272.4) Assessment: Comment Only  Her updated medication list for this problem  includes:    Lovastatin 40 Mg Tabs (Lovastatin) .Marland Kitchen... Take 1 tab by mouth at bedtime Low fat diet discussed and encouraged, and literature also given  Orders: T-Hepatic Function [id number]) T-Lipid Profile [id number])  Labs Reviewed: SGOT: 13 (09/24/2009)   SGPT: 12 (09/24/2009)  Lipid Goals: Chol Goal: 200 (07/24/2006)   HDL Goal: 40 (07/24/2006)   LDL Goal: 100 (07/24/2006)   TG Goal: 150 (07/24/2006)  Prior 10 Yr Risk Heart Disease: 2 % (07/18/2007)   HDL:52 (09/24/2009), 44 (04/06/2009)  LDL:123 (09/24/2009), 113 (04/06/2009)  Chol:190 (09/24/2009), 173 (04/06/2009)  Trig:75 (09/24/2009), 78 (04/06/2009)  Problem # 3:  ESSENTIAL HYPERTENSION (ICD-401.9) Assessment: Unchanged  Her updated medication list for this problem includes:    Lisinopril 40 Mg Tabs (Lisinopril) ..... One daily    Hydrochlorothiazide 25 Mg Tabs (Hydrochlorothiazide) ..... One daily  BP today: 118/74 Prior BP: 108/70 (07/15/2009)  Prior 10 Yr Risk Heart Disease: 2 % (07/18/2007)  Labs Reviewed: K+: 4.7 (09/24/2009) Creat: : 1.16 (09/24/2009)   Chol: 190 (09/24/2009)   HDL: 52 (09/24/2009)   LDL: 123 (09/24/2009)   TG: 75 (09/24/2009)  Problem # 4:  DERMATOMYCOSIS (ICD-111.9) Assessment: Improved  Her updated medication list for this problem includes:    Clotrimazole-betamethasone 1-0.05 % Crea (Clotrimazole-betamethasone) .Marland Kitchen... Apply twice daily to affected areas  Complete Medication List: 1)  Lisinopril 40 Mg Tabs (Lisinopril) .... One daily 2)  Hydrochlorothiazide 25 Mg Tabs (Hydrochlorothiazide) .... One daily 3)  Ascensia Autodisk Glusose Strips With [employer]  .... Test fsbs once daily 4)  Janumet 50-1000 Mg Tabs (Sitagliptin-metformin hcl) .... Take 1 tablet by mouth two times a day 5)  Clotrimazole-betamethasone 1-0.05 % Crea (Clotrimazole-betamethasone) .... Apply twice daily to affected areas 6)  Lancets and Strips  .... Two times a day testing 7)  Lovastatin 40 Mg Tabs (Lovastatin)  .... Take 1 tab by mouth at bedtime 8)  Glimepiride 4 Mg Tabs (Glimepiride) .... Take 1 tablet by mouth two times a day 9)  Slow Release Iron 160 (50 Fe) Mg Cr-tabs (Ferrous sulfate dried) .... Take 1 tablet by mouth once a day  Other Orders: Influenza Vaccine NON MCR ([id number]) T-Basic Metabolic Panel ([id number]) T- Hemoglobin A1C ([id number])  Patient Instructions: 1)  Please schedule a follow-up appointment in 3 months. 2)  It is important that you exercise regularly at least 20 minutes 5 times a week. If you develop chest pain, have severe difficulty breathing, or feel very tired , stop exercising immediately and seek medical attention. 3)  You need to lose weight. Consider a lower calorie diet and regular exercise.  4)  BMP prior to visit, ICD-9: 5)  Hepatic Panel prior to visit, ICD-9: 6)  Lipid Panel prior to visit, ICD-9: fasting in 3 months 7)  HbgA1C prior to visit, ICD-9: 8)  See your eye doctor yearly to check for diabetic eye damage.   Influenza Vaccine    Vaccine Type: Fluvax Non-MCR    Site: right deltoid    Mfr: novartis    Dose: 0.5 ml  Route: IM    Given by: Everitt Amber LPN    Exp. Date: 05/2010    Lot #: [id number]

## 2010-02-22 NOTE — Progress Notes (Signed)
[facility]   Imported By: Lind Guest 04/20/2009 13:23:12  _____________________________________________________________________  External Attachment:    Type:   Image     Comment:   External Document

## 2010-02-22 NOTE — Assessment & Plan Note (Signed)
Summary: office visit   Vital Signs:  Patient profile:   40 year old female Menstrual status:  irregular Height:      64 inches Weight:      373.50 pounds BMI:     64.34 O2 Sat:      96 % Pulse rate:   74 / minute Pulse rhythm:   regular Resp:     16 per minute BP sitting:   118 / 72  (left arm) Cuff size:   xl  Vitals Entered By: Everitt Amber LPN (May 06, 2009 8:27 AM)  Nutrition Counseling: Patient's BMI is greater than 25 and therefore counseled on weight management options. CC: Follow up chronic problems   Primary Care Provider:  Syliva Overman MD  CC:  Follow up chronic problems.  History of Present Illness: Reports  that she has been doing fairly well. She is attempting to be more diligent with diet, blood sugar tsting , and has yet to stat exercise. She is also here to review recent lab data, which show hyperlipidemia, and mild improvemnt in her blood sugars since Sept 2010. She is also anemic a,d reports menorragia Denies recent fever or chills. Denies sinus pressure, nasal congestion , ear pain or sore throat. Denies chest congestion, or cough productive of sputum. Denies chest pain, palpitations, PND, orthopnea or leg swelling. Denies abdominal pain, nausea, vomitting, diarrhea or constipation. Denies change in bowel movements or bloody stool. Denies dysuria , frequency, incontinence or hesitancy. Denies  joint pain, swelling, or reduced mobility. Denies headaches, vertigo, seizures. Denies depression, anxiety or insomnia. Denies  rash, lesions, or itch.     Current Medications (verified): 1)  Lisinopril 40 Mg  Tabs (Lisinopril) .... One Daily 2)  Hydrochlorothiazide 25 Mg  Tabs (Hydrochlorothiazide) .... One Daily 3)  Amaryl 2 Mg  Tabs (Glimepiride) .... One Two Times A Day 4)  Ascensia Autodisk Glusose Strips With [employer] .... Test Fsbs Once Daily 5)  Janumet 50-1000 Mg Tabs (Sitagliptin-Metformin Hcl) .... Take 1 Tablet By Mouth Two Times A Day 6)   Clotrimazole-Betamethasone 1-0.05 % Crea (Clotrimazole-Betamethasone) .... Apply Twice Daily To Affected Areas 7)  Lancets and Strips .... Two Times A Day Testing  Allergies (verified): No Known Drug Allergies  Review of Systems      See HPI General:  Denies sweats. Eyes:  Denies blurring, discharge, eye pain, and red eye. GU:  Complains of abnormal vaginal bleeding; reports heavy vag bleeding and is anemic and tired. Endo:  Denies cold intolerance, excessive hunger, excessive thirst, excessive urination, heat intolerance, polyuria, and weight change; pt has attempted once daily  fasting 150 to 190. Heme:  Denies abnormal bruising and bleeding. Allergy:  Denies hives or rash and itching eyes.  Physical Exam  General:  Morbidly obese female alert and oriented x 3 in no c/p distress.Mucosa pal HEENT: No facial asymmetry,  EOMI, No sinus tenderness, TM's Clear, oropharynx  pink and moist. neck short, no adenopathy, no JVD  Chest: Clear to auscultation bilaterally.  CVS: S1, S2,systolic  murmur, No S3.   Abd: Soft, Nontender. obese MS: Adequate ROM spine, hips, shoulders and knees.  Ext: No edema.   CNS: CN 2-12 intact, power tone and sensation normal throughout.   Skin: Intact, callouses on both heels Psych: Good eye contact, normal affect.  Memory intact, not anxious or depressed appearing.Pt appeared somewhat embarassed with poor eye contact at times while discussing her health and non compliance.    Impression & Recommendations:  Problem # 1:  DIABETES MELLITUS, TYPE II (ICD-250.00) Assessment Improved  The following medications were removed from the medication list:    Amaryl 2 Mg Tabs (Glimepiride) ..... One two times a day Her updated medication list for this problem includes:    Lisinopril 40 Mg Tabs (Lisinopril) ..... One daily    Janumet 50-1000 Mg Tabs (Sitagliptin-metformin hcl) .Marland Kitchen... Take 1 tablet by mouth two times a day    Glimepiride 4 Mg Tabs (Glimepiride) .Marland Kitchen...  Take 1 tablet by mouth two times a day  Orders: Glucose, (CBG) (82962) T- Hemoglobin A1C ([address]56)  Labs Reviewed: Creat: 0.73 (04/06/2009)     Last Eye Exam: Advised (10/03/2007) Reviewed HgBA1c results: 8.6 (04/07/2009)  12.4 (10/12/2008)  Problem # 2:  HYPERLIPIDEMIA (ICD-272.4) Assessment: Comment Only  Her updated medication list for this problem includes:    Lovastatin 40 Mg Tabs (Lovastatin) .Marland Kitchen... Take 1 tab by mouth at bedtime  Labs Reviewed: SGOT: 12 (04/06/2009)   SGPT: 12 (04/06/2009)  Lipid Goals: Chol Goal: 200 (07/24/2006)   HDL Goal: 40 (07/24/2006)   LDL Goal: 100 (07/24/2006)   TG Goal: 150 (07/24/2006)  Prior 10 Yr Risk Heart Disease: 2 % (07/18/2007)   HDL:44 (04/06/2009), 52 (07/11/2007)  LDL:113 (04/06/2009), 96 (21/30/8657)  Chol:173 (04/06/2009), 170 (07/11/2007)  Trig:78 (04/06/2009), 109 (07/11/2007)  Problem # 3:  ESSENTIAL HYPERTENSION (ICD-401.9) Assessment: Unchanged  Her updated medication list for this problem includes:    Lisinopril 40 Mg Tabs (Lisinopril) ..... One daily    Hydrochlorothiazide 25 Mg Tabs (Hydrochlorothiazide) ..... One daily  BP today: 118/72 Prior BP: 122/78 (04/05/2009)  Prior 10 Yr Risk Heart Disease: 2 % (07/18/2007)  Labs Reviewed: K+: 4.3 (04/06/2009) Creat: : 0.73 (04/06/2009)   Chol: 173 (04/06/2009)   HDL: 44 (04/06/2009)   LDL: 113 (04/06/2009)   TG: 78 (04/06/2009)  Problem # 4:  MORBID OBESITY (ICD-278.01) Assessment: Unchanged  Ht: 64 (05/06/2009)   Wt: 373.50 (05/06/2009)   BMI: 64.34 (05/06/2009)  Complete Medication List: 1)  Lisinopril 40 Mg Tabs (Lisinopril) .... One daily 2)  Hydrochlorothiazide 25 Mg Tabs (Hydrochlorothiazide) .... One daily 3)  Ascensia Autodisk Glusose Strips With [employer]  .... Test fsbs once daily 4)  Janumet 50-1000 Mg Tabs (Sitagliptin-metformin hcl) .... Take 1 tablet by mouth two times a day 5)  Clotrimazole-betamethasone 1-0.05 % Crea (Clotrimazole-betamethasone)  .... Apply twice daily to affected areas 6)  Lancets and Strips  .... Two times a day testing 7)  Lovastatin 40 Mg Tabs (Lovastatin) .... Take 1 tab by mouth at bedtime 8)  Glimepiride 4 Mg Tabs (Glimepiride) .... Take 1 tablet by mouth two times a day  Patient Instructions: 1)  F/U June 20  2)  HBAIC June 17 or 18, 2011 3)  Dose inc in glimepiride to 4mg  twice daily, take two 2mg  tabs twice daily till done 4)  pls test at least once daily and record  5)  pls committo walking for total 5 days per week at least 6)  pls changee snacks and follow a low fat diet. 7)  New med for cholesterol. 8)  tart one slow iron slow fe tablet daily (otc)  Prescriptions: JANUMET 50-1000 MG TABS (SITAGLIPTIN-METFORMIN HCL) Take 1 tablet by mouth two times a day  #60 x 4   Entered by:   Everitt Amber LPN   Authorized by:   Syliva Overman MD   Signed by:   Everitt Amber LPN on 84/69/6295   Method used:   Electronically to  [facility]  [address] [facility] (retail)       [address]       [place], [place]  [address]       Ph: [phone]       Fax: [phone]   RxID:   [id number] HYDROCHLOROTHIAZIDE 25 MG  TABS (HYDROCHLOROTHIAZIDE) One daily  #30 x 4   Entered by:   Everitt Amber LPN   Authorized by:   Syliva Overman MD   Signed by:   Everitt Amber LPN on 84/13/2440   Method used:   Electronically to        [facility]  [address] (retail)       [address]       [place], [place]  [address]       Ph: [phone]       Fax: [phone]   RxID:   [id number] GLIMEPIRIDE 4 MG TABS (GLIMEPIRIDE) Take 1 tablet by mouth two times a day  #60 x 3   Entered and Authorized by:   Syliva Overman MD   Signed by:   Syliva Overman MD on 05/06/2009   Method used:   Printed then faxed to ...       [facility]  [address] [address] [facility] (retail)       [address]       [place], [place]  [address]       Ph: [phone]       Fax: [phone]   RxID:    [id number] LOVASTATIN 40 MG TABS (LOVASTATIN) Take 1 tab by mouth at bedtime  #30 x 3   Entered and Authorized by:   Syliva Overman MD   Signed by:   Syliva Overman MD on 05/06/2009   Method used:   Electronically to        [facility]  [address] (retail)       [address]       [place], [place]  [address]       Ph: [phone]       Fax: [phone]   RxID:   [id number]   Laboratory Results   Blood Tests     Glucose (fasting): 144 mg/dL   (Normal Range: 17-510)

## 2010-02-22 NOTE — Progress Notes (Signed)
[facility]   Imported By: Lind Guest 05/18/2009 10:14:53  _____________________________________________________________________  External Attachment:    Type:   Image     Comment:   External Document

## 2010-02-22 NOTE — Assessment & Plan Note (Signed)
Summary: new patient   Vital Signs:  Patient profile:   40 year old female Menstrual status:  irregular LMP:     03/08/2009 Height:      64 inches Weight:      369.50 pounds BMI:     63.65 O2 Sat:      95 % Pulse rate:   85 / minute Pulse rhythm:   regular Resp:     16 per minute BP sitting:   122 / 78  (left arm) Cuff size:   xl  Vitals Entered By: Everitt Amber LPN (05/02/09 8:20 AM)  Nutrition Counseling: Patient's BMI is greater than 25 and therefore counseled on weight management options. CC: New Patient Is Patient Diabetic? Yes LMP (date): 03/08/2009     Menstrual Status irregular Enter LMP: 03/08/2009   Primary Care Provider:  Syliva Overman MD  CC:  New Patient.  History of Present Illness: new pt eval for this 40 y/o female who has uncontrolled doiabetes, and hypertension which is controlled. She reports not taking the metformin because of GI distress, has not been testing her sugars, reports dry mouth , polyuria and polydypsia. She alsohas lost weight with no effort since her visit over6 months ago She reports excessive daytime somnolence and snoring and based on her short neck, lg abdominal girth and morbid obesity , sleep apnea is extremely likely. She states she will keep referral and f/u appts which she was not doing in the past.. . Denies recent fever or chills. Denies sinus pressure, nasal congestion , ear pain or sore throat. Denies chest congestion, or cough productive of sputum. Denies chest pain, palpitations, PND, orthopnea or leg swelling. Denies abdominal pain, nausea, vomitting, diarrhea or constipation. Denies change in bowel movements or bloody stool. Denies dysuria , frequency, incontinence or hesitancy. Denies  joint pain, swelling, or reduced mobility. Denies headaches, vertigo, seizures. Denies depression, anxiety or insomnia. Denies  rash, lesions, or itch.       Current Medications (verified): 1)  Lisinopril 40 Mg  Tabs  (Lisinopril) .... One Daily 2)  Hydrochlorothiazide 25 Mg  Tabs (Hydrochlorothiazide) .... One Daily 3)  Amaryl 2 Mg  Tabs (Glimepiride) .... One Two Times A Day 4)  Ascensia Autodisk Glusose Strips With [employer] .... Test Fsbs Once Daily 5)  Metformin Hcl 500 Mg Tabs (Metformin Hcl) .... 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 Father: Dead 87 HTN  Mother: 53 HTN/DM Siblings: 2 x brothers - 54s with hx of abnormal rhythms KIds - 2 girls  healthy  Social History: Reviewed history from 10/12/2008 and no changes required. 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 CV:  Complains of difficulty breathing while lying down, shortness of breath with exertion, and swelling of feet; denies chest pain or discomfort, lightheadness, and palpitations; use 4 pillows for over 1 yr, denies PND. Derm:  Complains of lesion(s) and rash; denies itching; c/o thickening of the soles of  both feet.. Neuro:  Complains of headaches; denies seizures and sensation of room spinning; slight headaches, currently has one on avg twice weekly, above left eye. Psych:  Denies anxiety, depression, suicidal thoughts/plans, thoughts of violence, and unusual visions or sounds. Endo:  Denies excessive hunger, excessive thirst, and heat intolerance; not testing denies symptoms, last hBA1c was over 12. Heme:  Denies abnormal bruising and bleeding. Allergy:  Denies hives or rash and itching eyes.  Physical Exam  General:  Morbidly obese female alert and oriented x 3 in no c/p distress. HEENT: No facial asymmetry,  EOMI, No sinus tenderness, TM's Clear, oropharynx  pink and moist. neck short, no adenopathy, no JVD  Chest: Clear to auscultation bilaterally.  CVS: S1, S2,systolic  murmur, No S3.   Abd: Soft, Nontender. obese MS: Adequate ROM spine, hips, shoulders and knees.  Ext: No edema.   CNS: CN 2-12 intact, power tone and sensation normal throughout.   Skin: Intact, callouses on both heels Psych: Good eye contact, normal affect.  Memory intact, not anxious or depressed appearing.Pt appeared somewhat embarassed with poor eye contact at times while discussing her health and non compliance.    Impression & Recommendations:  Problem # 1:  SLEEP APNEA (ICD-780.57) Assessment Comment Only  Orders: Sleep Disorder Referral (Sleep Disorder), pt historyt and body habitus place her at very high risk  Problem # 2:  ESSENTIAL HYPERTENSION (ICD-401.9) Assessment: Improved  Her updated medication list for this problem includes:    Lisinopril 40 Mg Tabs (Lisinopril) ..... One daily    Hydrochlorothiazide 25 Mg Tabs (Hydrochlorothiazide) ..... One daily  Orders: T-Basic Metabolic Panel [phone]) Echo Referral (Echo)  BP today: 122/78 Prior BP: 152/84 (10/12/2008)  Prior 10 Yr Risk Heart Disease: 2 % (07/18/2007)  Labs Reviewed: K+: 4.8 (08/22/2007) Creat: : 1.01 (08/22/2007)    Chol: 170 (07/11/2007)   HDL: 52 (07/11/2007)   LDL: 96 (07/11/2007)   TG: 109 (07/11/2007)  Problem # 3:  DIABETES MELLITUS, TYPE II, UNCONTROLLED (ICD-250.02) Assessment: Unchanged  The following medications were removed from the medication list:    Metformin Hcl 500 Mg Tabs (Metformin hcl) ..... One daily Her updated medication list for this problem includes:    Lisinopril 40 Mg Tabs (Lisinopril) ..... One daily    Amaryl 2 Mg Tabs (Glimepiride) ..... One two times a day    Janumet 50-1000 Mg Tabs (Sitagliptin-metformin hcl) .Marland Kitchen... Take 1 tablet by mouth two times a day  Orders: Glucose, (CBG) ([address]782) T-Urine Microalbumin w/creat. ratio [phone])  Labs Reviewed: Creat: 1.01 (08/22/2007)     Last Eye Exam: Advised (10/03/2007), since bG uncontrolled , will hold on eye exam at this time Reviewed HgBA1c results: 12.4 (10/12/2008)  7.7 (10/03/2007)  Problem # 4:  AORTIC STENOSIS - MILD (ICD-424.1) Assessment: Comment Only  Orders: Echo Referral (Echo)  Problem # 5:  MORBID OBESITY (ICD-278.01) Assessment: Unchanged  Ht: 64 (04/05/2009)   Wt: 369.50 (04/05/2009)   BMI: 63.65 (04/05/2009)  Problem # 6:  DERMATOMYCOSIS (ICD-111.9) Assessment: Comment Only  Her updated medication list for this problem includes:    Clotrimazole-betamethasone  1-0.05 % Crea (Clotrimazole-betamethasone) .Marland Kitchen... Apply twice daily to affected areas  Complete Medication List: 1)  Lisinopril 40 Mg Tabs (Lisinopril) .... One daily 2)  Hydrochlorothiazide 25 Mg Tabs (Hydrochlorothiazide) .... One daily 3)  Amaryl 2 Mg Tabs (Glimepiride) .... One two times a day 4)  Ascensia Autodisk Glusose Strips With [employer]  .... Test fsbs once daily 5)  Janumet 50-1000 Mg Tabs (Sitagliptin-metformin hcl) .... Take 1 tablet by mouth two times a day 6)  Clotrimazole-betamethasone 1-0.05 % Crea (Clotrimazole-betamethasone) .... Apply twice daily to affected areas 7)  Lancets and Strips  .... Two times a day  testing  Other Orders: T-Hepatic Function [id number]) T-Lipid Profile [id number]) T-CBC w/Diff [phone]) T-TSH [phone]) Radiology Referral (Radiology)  Patient Instructions: 1)  Please schedule a follow-up appointment in 1 month. 2)  It is important that you exercise regularly at least 20 minutes 5 times a week. If you develop chest pain, have severe difficulty breathing, or feel very tired , stop exercising immediately and seek medical attention. 3)  You need to lose weight. Consider a lower calorie diet and regular exercise.  4)  Check your blood sugars regularly. If your readings are usually above : or below 70 you should contact our office. 5)  you will be referred for echocardiogram, sleep study and mamogram 6)  BMP prior to visit, ICD-9: 7)  Hepatic Panel prior to visit, ICD-9: 8)  Lipid Panel prior to visit, ICD-9: 9)  TSH prior to visit, ICD-9: fasting asap 10)  CBC w/ Diff prior to visit, ICD-9: 11)  Urine Microalbumin prior to visit, ICD-9: Prescriptions: LANCETS AND STRIPS two times a day testing  #60 x 11   Entered by:   Everitt Amber LPN   Authorized by:   Syliva Overman MD   Signed by:   Syliva Overman MD on 04/05/2009   Method used:   Handwritten   RxID:   [id number] CLOTRIMAZOLE-BETAMETHASONE 1-0.05 % CREA (CLOTRIMAZOLE-BETAMETHASONE) Apply twice daily to affected areas  #30g x 1   Entered by:   Everitt Amber LPN   Authorized by:   Syliva Overman MD   Signed by:   Syliva Overman MD on 04/05/2009   Method used:   Handwritten   RxID:   [id number] JANUMET 50-1000 MG TABS (SITAGLIPTIN-METFORMIN HCL) Take 1 tablet by mouth two times a day  #60 x 1   Entered and Authorized by:   Syliva Overman MD   Signed by:   Syliva Overman MD on 04/05/2009   Method used:   Electronically to        [facility]  [address] [address] [facility] (retail)       [address]       [place], [place]  [address]       Ph: [phone]       Fax: [phone]    RxID:   [id number]    Laboratory Results   Blood Tests     Glucose (random): 210 mg/dL   (Normal Range: 78-242)

## 2010-02-22 NOTE — Letter (Signed)
Summary: Letter  Letter   Imported By: Lind Guest 07/21/2009 14:31:02  _____________________________________________________________________  External Attachment:    Type:   Image     Comment:   External Document

## 2010-02-24 NOTE — Assessment & Plan Note (Signed)
Summary: office visit   Vital Signs:  Patient profile:   40 year old female Menstrual status:  irregular Height:      64 inches Weight:      366.75 pounds BMI:     63.18 O2 Sat:      96 % on Room air Pulse rate:   80 / minute Pulse rhythm:   regular Resp:     16 per minute BP sitting:   120 / 80  (left arm)  Vitals Entered By: Adella Hare LPN (December 29, 2009 9:16 AM)  Nutrition Counseling: Patient's BMI is greater than 25 and therefore counseled on weight management options.  O2 Flow:  Room air CC: follow-up visit Is Patient Diabetic? Yes Pain Assessment Patient in pain? no        Primary Care Provider:  Syliva Overman MD  CC:  follow-up visit.  History of Present Illness: Reports  that she has not been doing well with her diet, and as a result she is aware that her blood sugars are again uncontrolled , unfortunately. She reports lot of stress with her mother being ill, and long work hours. Denies recent fever or chills. Denies sinus pressure, nasal congestion , ear pain or sore throat. Denies chest congestion, or cough productive of sputum. Denies chest pain, palpitations, PND, orthopnea or leg swelling. Denies abdominal pain, nausea, vomitting, diarrhea or constipation. Denies change in bowel movements or bloody stool. Denies dysuria , frequency, incontinence or hesitancy. Denies  joint pain, swelling, or reduced mobility. Denies headaches, vertigo, seizures. Denies depression, anxiety or insomnia. Denies  rash, lesions, or itch.     Current Medications (verified): 1)  Lisinopril 40 Mg  Tabs (Lisinopril) .... One Daily 2)  Hydrochlorothiazide 25 Mg  Tabs (Hydrochlorothiazide) .... One Daily 3)  Ascensia Autodisk Glusose Strips With Supplies .... Test Fsbs Once Daily 4)  Janumet 50-1000 Mg Tabs (Sitagliptin-Metformin Hcl) .... Take 1 Tablet By Mouth Two Times A Day 5)  Clotrimazole-Betamethasone 1-0.05 % Crea (Clotrimazole-Betamethasone) .... Apply Twice  Daily To Affected Areas 6)  Lancets and Strips .... Two Times A Day Testing 7)  Lovastatin 40 Mg Tabs (Lovastatin) .... Take 1 Tab By Mouth At Bedtime 8)  Glimepiride 4 Mg Tabs (Glimepiride) .... Take 1 Tablet By Mouth Two Times A Day 9)  Slow Release Iron 160 (50 Fe) Mg Cr-Tabs (Ferrous Sulfate Dried) .... Take 1 Tablet By Mouth Once A Day  Allergies (verified): No Known Drug Allergies  Review of Systems      See HPI General:  Complains of fatigue. Eyes:  Denies discharge, eye pain, and red eye. GI:  Complains of abdominal pain, diarrhea, nausea, and vomiting; Last Thursday for4 days acute GI symptoms. MS:  Complains of joint pain and stiffness. Endo:  Complains of excessive thirst and excessive urination; blood sugars when tested are often over 200. Heme:  Denies abnormal bruising and bleeding. Allergy:  Denies hives or rash, itching eyes, seasonal allergies, and sneezing.  Physical Exam  General:  Well-developed,obese,in no acute distress; alert,appropriate and cooperative throughout examination HEENT: No facial asymmetry,  EOMI, No sinus tenderness, TM's Clear, oropharynx  pink and moist.   Chest: Clear to auscultation bilaterally.  CVS: S1, S2, No murmurs, No S3.   Abd: Soft, Nontender.  MS: decreased  ROM spine, hips, shoulders and knees.  Ext: No edema.   CNS: CN 2-12 intact, power tone and sensation normal throughout.   Skin: Intact, no visible lesions or rashes.  Psych: Good eye  contact, normal affect.  Memory intact, not anxious or depressed appearing.    Impression & Recommendations:  Problem # 1:  DIABETES MELLITUS, TYPE II (ICD-250.00) Assessment Deteriorated  Her updated medication list for this problem includes:    Lisinopril 40 Mg Tabs (Lisinopril) ..... One daily    Janumet 50-1000 Mg Tabs (Sitagliptin-metformin hcl) .Marland Kitchen... Take 1 tablet by mouth two times a day    Glimepiride 4 Mg Tabs (Glimepiride) .Marland Kitchen... Take 1 tablet by mouth two times a day Patient  advised to reduce carbs and sweets, commit to regular physical activity, take meds as prescribed, test blood sugars as directed, and attempt to lose weight , to improve blood sugar control.  Orders: T- Hemoglobin A1C ([id number])  Labs Reviewed: Creat: 0.73 (12/22/2009)     Last Eye Exam: Advised (10/03/2007) Reviewed HgBA1c results: 9.1 (12/22/2009)  6.7 (09/24/2009)  Problem # 2:  HYPERLIPIDEMIA (ICD-272.4) Assessment: Comment Only  Her updated medication list for this problem includes:    Lovastatin 40 Mg Tabs (Lovastatin) .Marland Kitchen... Take 1 tab by mouth at bedtime Low fat dietdiscussed and encouraged  Labs Reviewed: SGOT: 11 (12/22/2009)   SGPT: 12 (12/22/2009)  Lipid Goals: Chol Goal: 200 (07/24/2006)   HDL Goal: 40 (07/24/2006)   LDL Goal: 100 (07/24/2006)   TG Goal: 150 (07/24/2006)  Prior 10 Yr Risk Heart Disease: 2 % (07/18/2007)   HDL:48 (12/22/2009), 52 (09/24/2009)  LDL:100 (12/22/2009), 123 (09/24/2009)  Chol:163 (12/22/2009), 190 (09/24/2009)  Trig:73 (12/22/2009), 75 (09/24/2009)  Problem # 3:  ESSENTIAL HYPERTENSION (ICD-401.9) Assessment: Unchanged  Her updated medication list for this problem includes:    Lisinopril 40 Mg Tabs (Lisinopril) ..... One daily    Hydrochlorothiazide 25 Mg Tabs (Hydrochlorothiazide) ..... One daily  Discussed nail care and medication treatment options.   BP today: 120/80 Prior BP: 118/74 (09/29/2009)  Prior 10 Yr Risk Heart Disease: 2 % (07/18/2007)  Labs Reviewed: K+: 4.5 (12/22/2009) Creat: : 0.73 (12/22/2009)   Chol: 163 (12/22/2009)   HDL: 48 (12/22/2009)   LDL: 100 (12/22/2009)   TG: 73 (12/22/2009)  Problem # 4:  MORBID OBESITY (ICD-278.01) Assessment: Deteriorated  Ht: 64 (12/29/2009)   Wt: 366.75 (12/29/2009)   BMI: 63.18 (12/29/2009) therapeutic lifestyle change discussed and encouraged  Complete Medication List: 1)  Lisinopril 40 Mg Tabs (Lisinopril) .... One daily 2)  Hydrochlorothiazide 25 Mg Tabs  (Hydrochlorothiazide) .... One daily 3)  Ascensia Autodisk Glusose Strips With [employer]  .... Test fsbs once daily 4)  Janumet 50-1000 Mg Tabs (Sitagliptin-metformin hcl) .... Take 1 tablet by mouth two times a day 5)  Clotrimazole-betamethasone 1-0.05 % Crea (Clotrimazole-betamethasone) .... Apply twice daily to affected areas 6)  Lancets and Strips  .... Two times a day testing 7)  Lovastatin 40 Mg Tabs (Lovastatin) .... Take 1 tab by mouth at bedtime 8)  Glimepiride 4 Mg Tabs (Glimepiride) .... Take 1 tablet by mouth two times a day 9)  Slow Release Iron 160 (50 Fe) Mg Cr-tabs (Ferrous sulfate dried) .... Take 1 tablet by mouth once a day  Other Orders: T-CBC w/Diff ([id number]) T-Anemia Panel 3  (2904)  Patient Instructions: 1)  Please schedule a follow-up appointment in 3 months. 2)  It is important that you exercise regularly at least 20 minutes 5 times a week. If you develop chest pain, have severe difficulty breathing, or feel very tired , stop exercising immediately and seek medical attention. 3)  You need to lose weight. Consider a lower calorie diet and regular exercise.  4)  HbgA1C prior to visit, ICD-9: 5)  CBC w/ Diff prior to visit, ICD-9: and anemia panel Prescriptions: GLIMEPIRIDE 4 MG TABS (GLIMEPIRIDE) Take 1 tablet by mouth two times a day  #60 Each x 3   Entered by:   Adella Hare LPN   Authorized by:   Syliva Overman MD   Signed by:   Adella Hare LPN on 47/82/9562   Method used:   Electronically to        [facility]  [address] (retail)       [address]       [place], [place]  [address]       Ph: [phone]       Fax: [phone]   RxID:   [id number]    Orders Added: 1)  Est. Patient Level IV [59563] 2)  T-CBC w/Diff [[id number]] 3)  T- Hemoglobin A1C [[id number]] 4)  T-Anemia Panel 3  [2904]

## 2010-03-25 LAB — CONVERTED CEMR LAB: Retic Ct Pct: 0.7 % (ref 0.4–3.1)

## 2010-03-30 ENCOUNTER — Ambulatory Visit: Payer: Self-pay | Admitting: Family Medicine

## 2010-04-01 ENCOUNTER — Encounter: Payer: Self-pay | Admitting: Family Medicine

## 2010-04-05 NOTE — Letter (Signed)
Summary: medical release  medical release   Imported By: Lind Guest 04/01/2010 13:41:01  _____________________________________________________________________  External Attachment:    Type:   Image     Comment:   External Document

## 2010-05-10 ENCOUNTER — Encounter: Payer: Self-pay | Admitting: Family Medicine

## 2010-05-11 ENCOUNTER — Other Ambulatory Visit: Payer: Self-pay | Admitting: Family Medicine

## 2010-05-11 ENCOUNTER — Ambulatory Visit (INDEPENDENT_AMBULATORY_CARE_PROVIDER_SITE_OTHER): Payer: Managed Care, Other (non HMO) | Admitting: Family Medicine

## 2010-05-11 ENCOUNTER — Encounter: Payer: Self-pay | Admitting: Family Medicine

## 2010-05-11 ENCOUNTER — Telehealth: Payer: Self-pay | Admitting: Family Medicine

## 2010-05-11 DIAGNOSIS — E785 Hyperlipidemia, unspecified: Secondary | ICD-10-CM

## 2010-05-11 DIAGNOSIS — I10 Essential (primary) hypertension: Secondary | ICD-10-CM

## 2010-05-11 DIAGNOSIS — Z139 Encounter for screening, unspecified: Secondary | ICD-10-CM

## 2010-05-11 DIAGNOSIS — E119 Type 2 diabetes mellitus without complications: Secondary | ICD-10-CM

## 2010-05-11 LAB — POCT URINALYSIS DIPSTICK
Leukocytes, UA: NEGATIVE
Protein, UA: NEGATIVE
Spec Grav, UA: 1.015

## 2010-05-11 NOTE — Progress Notes (Signed)
  Subjective:    Patient ID: Isabella Matthews, female    DOB: 02-19-1970, 40 y.o.   MRN: [id number]  HPI Tests fasting sugars , ranges from 200 plus this past week, before this 115 to 120, has been noncompliant with janumet takes one daily, and has been non compliant with diet this past week,she denies polyuria, polydypsia, blurred vision or hypoglycemic episodes. She remains stressed , works excessively, still has not had her sleep study.   Review of Systems Denies recent fever or chills. Denies sinus pressure, nasal congestion, ear pain or sore throat. Denies chest congestion, productive cough or wheezing. Denies chest pains, palpitations, paroxysmal nocturnal dyspnea, orthopnea and leg swelling Denies abdominal pain, nausea, vomiting,diarrhea or constipation.  Denies rectal bleeding or change in bowel movement. Reports urinary  Frequency, and mild dysuria Denies joint pain, swelling and limitation and mobility. Denies headaches, seizure, numbness, or tingling. Denies depression, anxiety or insomnia. Denies skin break down or rash.        Objective:   Physical Exam    Patient alert and oriented and in no Cardiopulmonary distress.Morbidly obese  HEENT: No facial asymmetry, EOMI, no sinus tenderness, TM's clear, Oropharynx pink and moist.  Neck supple no adenopathy.  Chest: Clear to auscultation bilaterally.  CVS: S1, S2 no murmurs, no S3.  ABD: Soft non tender. Bowel sounds normal.  Ext: No edema  MS: Adequate ROM spine, shoulders, hips and knees.  Skin: Intact, no ulcerations or rash noted.  Psych: Good eye contact, normal affect. Memory intact not anxious or depressed appearing.  CNS: CN 2-12 intact, power, tone and sensation normal throughout.     Assessment & Plan:  1.Hypertension:Controlled, no changes in medication.  2. Diabetes: deteriorated, pt needs to be diligent with med and diet, also needs to increase exercise 3. Obesity: deteriorated 4.Mammogram  past due 5, Hyperlipidemia: Hyperlipidemia:Low fat diet discussed and encouraged.  Continue medication

## 2010-05-11 NOTE — Patient Instructions (Addendum)
CPE in 3  Months.  HBA1c and chem 7 today.also microalb  And CCUA today from the office  IT is VITAL that you take the janumet TWO tablets daily for your blood sugars.  Pls go  To the diabetic class of your choice  Fasting lipid , hepatic and chem 7, hBa1C  Mammogram  will be scheduled

## 2010-05-12 LAB — BASIC METABOLIC PANEL
Chloride: 102 mEq/L (ref 96–112)
Potassium: 4.8 mEq/L (ref 3.5–5.3)

## 2010-05-12 LAB — HEMOGLOBIN A1C: Mean Plasma Glucose: 286 mg/dL — ABNORMAL HIGH (ref <117)

## 2010-05-13 LAB — MICROALBUMIN / CREATININE URINE RATIO: Microalb Creat Ratio: 8.9 mg/g (ref 0.0–30.0)

## 2010-05-13 NOTE — Telephone Encounter (Signed)
Patient has an appointment she is aware.

## 2010-05-15 ENCOUNTER — Encounter: Payer: Self-pay | Admitting: Family Medicine

## 2010-05-16 ENCOUNTER — Telehealth: Payer: Self-pay | Admitting: Family Medicine

## 2010-05-16 DIAGNOSIS — Z794 Long term (current) use of insulin: Secondary | ICD-10-CM

## 2010-05-16 MED ORDER — INSULIN DETEMIR 100 UNIT/ML ~~LOC~~ SOLN: 15.0000 [IU] | Freq: Every day | SUBCUTANEOUS | Status: DC

## 2010-05-16 NOTE — Telephone Encounter (Signed)
janumet 

## 2010-05-16 NOTE — Telephone Encounter (Signed)
Spoke with pt , she states she has been having vomitting and diarreah, advised her to stop janumet, continue there amaryl twice daily, and start levemir at 7 units once daily, script is being sent 15 units once daily, titrate every 3 days as needed. She is to come in am for nurses to educate her on titrating and get a coupon for levemir. Pls expect pt in am

## 2010-05-16 NOTE — Telephone Encounter (Signed)
States janumet is giving her diarreah and vomitting

## 2010-05-17 ENCOUNTER — Ambulatory Visit (HOSPITAL_COMMUNITY)
Admission: RE | Admit: 2010-05-17 | Discharge: 2010-05-17 | Disposition: A | Discharge status: Home / Self Care | Payer: Managed Care, Other (non HMO) | Source: Ambulatory Visit | Attending: Family Medicine | Admitting: Family Medicine

## 2010-05-17 ENCOUNTER — Other Ambulatory Visit: Payer: Self-pay

## 2010-05-17 DIAGNOSIS — E119 Type 2 diabetes mellitus without complications: Secondary | ICD-10-CM

## 2010-05-17 DIAGNOSIS — Z1231 Encounter for screening mammogram for malignant neoplasm of breast: Secondary | ICD-10-CM | POA: Insufficient documentation

## 2010-05-17 DIAGNOSIS — Z139 Encounter for screening, unspecified: Secondary | ICD-10-CM

## 2010-05-17 MED ORDER — INSULIN PEN NEEDLE 31G X 8 MM MISC: Status: DC

## 2010-05-17 MED ORDER — INSULIN DETEMIR 100 UNIT/ML ~~LOC~~ SOLN: 15.0000 [IU] | Freq: Every day | SUBCUTANEOUS | Status: DC

## 2010-05-17 NOTE — Telephone Encounter (Signed)
brandi took care of patient this morning

## 2010-05-20 ENCOUNTER — Other Ambulatory Visit: Payer: Self-pay | Admitting: Family Medicine

## 2010-05-25 ENCOUNTER — Telehealth: Payer: Self-pay | Admitting: Family Medicine

## 2010-05-25 NOTE — Telephone Encounter (Signed)
Returned call, left message. 

## 2010-05-26 ENCOUNTER — Telehealth: Payer: Self-pay | Admitting: Family Medicine

## 2010-05-26 MED ORDER — INSULIN DETEMIR 100 UNIT/ML ~~LOC~~ SOLN: SUBCUTANEOUS | Status: DC

## 2010-05-26 NOTE — Telephone Encounter (Signed)
Discussed with pt titratingf every 3 days, her current dose is 18 uniots , she is to start 21 units tonight, and a new script is sent in for levimir 35 units nightly

## 2010-05-26 NOTE — Telephone Encounter (Signed)
She is taking 15 units at bedtime

## 2010-05-30 NOTE — Telephone Encounter (Signed)
Duplicate message. 

## 2010-06-07 NOTE — Procedures (Signed)
NAMEALBERTINA, LEISE NO.:  [id number]   MEDICAL RECORD NO.:  [id number]          PATIENT TYPE:  OUT   LOCATION:  RAD                           FACILITY:  [facility]   PHYSICIAN:  Dani Gobble, MD       DATE OF BIRTH:  March 12, 1970   DATE OF PROCEDURE:  07/31/2006  DATE OF DISCHARGE:                                ECHOCARDIOGRAM   REFERRING:  Franchot Heidelberg, MD, and Dani Gobble, MD.   INDICATIONS:  A 40 year old female with a cardiac murmur, referred for  evaluation.   The technical quality of the study is reasonable.   The aorta measures normally at 2.3 cm.   The left atrium measures normally at 3.9 cm.  The patient appeared to be  in sinus rhythm during this procedure.   The interventricular septum is mildly thickened at 1.2 cm while the  posterior wall is upper normal at 1.1 cm.   The aortic valve is not well-visualized with regard to the leaflets.  Overall aortic valve opening appears reasonable.  Doppler interrogation  of the aortic valve reveals a peak velocity of 2.2 m/sec., corresponding  to a peak gradient 20 mmHg and a mean gradient of 15 mmHg and an area by  the continuity equation of 1.9 sq. cm.   The mitral valve appears structurally normal.  No mitral valve prolapse  is noted.  Trivial mitral regurgitation is noted.  Doppler interrogation  of the mitral valve is within normal limits.   The pulmonic valve is incompletely visualized but appeared to be grossly  structurally normal.   The tricuspid valve also appears to be grossly structurally normal with  mild tricuspid regurgitation noted.   The left ventricle is normal in size with the LVIDD measuring 4.7 cm and  LVIC measured 3.5 cm.  Overall ejection fraction appears normal.  In the  apical views only, the inferior wall appeared somewhat hypokinetic but  this was not appreciated in other views.  Again, overall ejection  fraction is normal.   The right atrium and right ventricle appeared  normal in size with normal  right ventricular systolic function.   No pericardial effusion is noted.   IMPRESSION:  1. Mild asymmetric septal hypertrophy.  2. Although the aortic valve leaflets are not well-seen and the      overall opening appears reasonable, Doppler interrogation suggests      mild aortic stenosis, as does the continuity equation calculated      area.  3. Mild tricuspid regurgitation.  4. Normal left ventricular size and ejection fraction.  In one view      only (apical), the inferior wall appeared somewhat hypokinetic but      this was not appreciated in other views and overall ejection      fraction is normal.  5. Consider transesophageal echocardiogram for enhanced delineation of      the aortic valve if clinically indicated.           ______________________________  Dani Gobble, MD     AB/MEDQ  D:  07/31/2006  T:  08/01/2006  Job:  [id number]

## 2010-06-10 NOTE — Op Note (Signed)
Isabella Matthews, Isabella Matthews             ACCOUNT NO.:  [id number]   MEDICAL RECORD NO.:  [id number]          PATIENT TYPE:  AMB   LOCATION:  DAY                           FACILITY:  [facility]   PHYSICIAN:  R. Roetta Sessions, M.D. DATE OF BIRTH:  10-23-1970   DATE OF PROCEDURE:  04/03/2006  DATE OF DISCHARGE:                               OPERATIVE REPORT   PROCEDURE:  Diagnostic colonoscopy.   INDICATIONS FOR PROCEDURE:  40 year old African-American female with  rectal bleeding, proctalgia, no family history of colorectal neoplasia.  Never had her lower GI tract imaged.  Colonoscopy is now being done.  This approach has discussed with the patient at length.  Potential  risks, benefits and alternatives have been reviewed, questions answered.  She is agreeable.  Please see documentation in medical record.   PROCEDURE NOTE:  O2 saturation, blood pressure, pulse and respirations  were monitored throughout the entire procedure.  Conscious sedation with  Versed 5 mg IV, Demerol 75 mg IV in divided doses.   INSTRUMENT:  Pentax video chip system.   FINDINGS:  Digital rectal examination revealed only a tender anal canal.   ENDOSCOPIC FINDINGS:  The prep was adequate.  Examination the colonic  mucosa from the rectosigmoid junction through the left, transverse,  right colon to the area of appendiceal orifice, ileocecal valve and  cecum was undertaken.  These structures well seen and photographed for  the record.  From this level scope slowly withdrawn.  All previous  mucosal surfaces were again seen.  The colonic mucosa appeared normal.  The scope was pulled down the rectum where thorough examination of the  rectal mucosa including retroflex of the anal verge was undertaken.  The  patient had some anal papilla and internal hemorrhoids, otherwise rectal  mucosa appeared normal.  The patient tolerated the procedure well was  reacted in endoscopy.   IMPRESSION:  Anal papilla and internal hemorrhoids.   An occult fissure  is not excluded.  Otherwise normal rectum and colon.   RECOMMENDATIONS:  Will start on some AnaMantle Forte cream applied to  anorectum q.i.d., daily fiber supplement with Fibersure or Benefiber.  Follow-up with [place] in 1 month.  If rectal pain is not improved, will  consider a trial of nitroglycerin appointment for a possible occult anal  fissure.      Jonathon Bellows, M.D.  Electronically Signed     RMR/MEDQ  D:  04/03/2006  T:  04/03/2006  Job:  [id number]   cc:   Dr. Murray Hodgkins

## 2010-06-10 NOTE — Op Note (Signed)
[facility]  Patient:    Isabella Matthews, Isabella Matthews Visit Number: [id number] MRN: [id number]          Service Type: [facility] Location: [facility] Attending Physician:  Ron Parker Dictated by:   Gloris Manchester. Lazarus Salines, M.D. Proc. Date: 08/07/01 Admit Date:  08/05/2001   CC:         Della Goo, M.D.  Christen Butter, M.D.   Operative Report  PREOPERATIVE DIAGNOSES: 1. Obstructive adenotonsillar hypertrophy with sleep apnea. 2. Recurrent tonsillitis. 3. Morbid obesity.  POSTOPERATIVE DIAGNOSES: 1. Obstructive adenotonsillar hypertrophy with sleep apnea. 2. Recurrent tonsillitis. 3. Morbid obesity.  PROCEDURE PERFORMED:  Tonsillectomy, adenoidectomy.  SURGEON:  Gloris Manchester. Lazarus Salines, M.D.  ANESTHESIA:  General orotracheal.  ESTIMATED BLOOD LOSS:  50 cc.  COMPLICATIONS:  None.  FINDINGS:  Protruding tonsils 4+ with cryptic debris.  Normal soft palate. Flushy pharynx with a very bulky tongue.  Narrow nasopharynx.  Adenoids that are 75 to 80% obstructive.  Very bulky posterior pole of the middle and inferior turbinate on both sides with obstruction at the ______ level.  DESCRIPTION OF PROCEDURE:  With the patient in a comfortable supine position, general orotracheal anesthesia was induced without difficulty.  At an appropriate level, the table was turned 90 degrees, and the patient placed in Trendelenburg.  A clean preparation and draping was accomplished.  Taking care to protect lips, teeth, and endotracheal tube, the Crowe-Davis mouthgag was introduced.  A #3 blade was too small.  A #4 flat blade was chosen.  The gag was placed once again, and this adequately supported the tongue and the orotracheal tube.  The findings were as described above.  The mouthgag was suspended from the Mayo stand in the standard fashion.  Palate retractor and mirror were used to visualize the nasopharynx with the findings as described above.  Adenoidectomy was elected.  The  anterior nose was inspected with a nasal speculum with moderate congestion noted.  No active drainage.  Xylocaine 0.50% with 1:200,000 epinephrine, 10 cc total was infiltrated into the peritonsillar plane on each side for intraoperative hemostasis.  Several minutes were allowed for this to take effect.  The adenoid pad was sucked free from the nasopharynx using sharp adenoid curettes and several passes medially and laterally.  Tissue was carefully removed from the field and passed off as specimen.  The nasopharynx was suctioned, cleaned, and packed with saline moistened tonsil sponges for hemostasis.  Beginning on the right side because it was more readily accessible, the tonsil was grasped and retracted medially.  The mucosa overlying the interior and superior poles was coagulated, and then cut down to the capsule over the tonsil.  Using the cautery and ______ blunt dissector, coagulating crossing vessels were identified, and lysing fibrous bands, the tonsil was dissected free with the fossa from inferiorly upward.  The tonsil was removed in its entirety as determined by examination of both tonsil and fossa.  A small additional quantity of cautery rendered the fossa hemostatic.  After completing the right tonsillectomy, the mouthgag was repositioned for access to the left tonsil, and it was removed in the same fashion.  After achieving hemostasis in the oropharynx, the nasopharynx was unpacked.  A red rubber catheter was passed through the nose and out the mouth with the palate retractor.  Using suction cautery and indirect visualization, small adenoid tags of the lateral bands and ______ were ablated, and the adenoid duct proper was coagulated for hemostasis.  The posterior pole of the inferior  middle turbinates of both sides which were obstructing the ______ were coagulated gently, reducing the overall bulk.  Upon achieving hemostasis in the nasopharynx, the oropharynx was again  observed to be hemostatic.  At this point, the palate retractor and mouthgag were relaxed for several minutes. Upon re-expansion, hemostasis was persisted.  At this point, the procedure was completed.  The palate retractor and mouthgag was relaxed and removed.  The dental status was intact.  The patient was returned from anesthesia, awakened, extubated, and transferred to recovery in stable condition.  COMMENTS:  A 40 year old black female with a history of two to three times yearly severe tonsillitis, but also a history of snoring and probable obstructive sleep apnea, as well as morbid obesity with several indications for todays procedure.  Anticipate a routine postoperative recovery with observation in the step-down unit, given her obesity and sleep apnea status, with attention to antibiosis, hydration, analgesia, and observation for bleeding, emesis, airway compromise, or substantial oxygen desaturation. Dictated by:   Gloris Manchester. Lazarus Salines, M.D. Attending Physician:  Ron Parker DD:  08/07/01 TD:  08/11/01 Job: [id number]

## 2010-06-10 NOTE — Consult Note (Signed)
NAMESEYNABOU, Matthews             ACCOUNT NO.:  [id number]   MEDICAL RECORD NO.:  [id number]          PATIENT TYPE:  AMB   LOCATION:                                FACILITY:  [facility]   PHYSICIAN:  R. Roetta Sessions, M.D. DATE OF BIRTH:  Feb 02, 1970   DATE OF CONSULTATION:  03/26/2006  DATE OF DISCHARGE:                                 CONSULTATION   CHIEF COMPLAINT:  Hematochezia.   HISTORY OF PRESENT ILLNESS:  Isabella Matthews is a 40 year old female who  states on March 05, 2006 she developed a large amount of bright red  bleeding while stooling.  She had some significant straining with hard  stool.  She began to have daily severe proctalgia with stooling as well  as daily bleeding in moderate to large amounts.  This went on for about  2 weeks.  She was seen by her primary care Isabella Matthews.  She was given a  prescription for Anusol ointment, suppositories, Tucks pads - none of  which seem to relieve the pain or the bleeding.  She says the bleeding  is somewhat better over the last couple days.  She generally has two to  three bowel movements per day.  Denies any abdominal pain, nausea, or  vomiting.   PAST MEDICAL HISTORY:  Diabetes mellitus, hypertension, C-section x2.   CURRENT MEDICATIONS:  1. Benicar 40/25 mg daily.  2. Lamictal 250 mg daily.  3. Hydrochlorothiazide 30 mg daily.  4. Metformin 500 mg daily.  5. Ibuprofen 200-400 mg b.i.d. p.r.n.   ALLERGIES:  NO KNOWN DRUG ALLERGIES.   FAMILY HISTORY:  No known family history of colorectal carcinoma or  inflammatory bowel disease.   SOCIAL HISTORY:  Isabella Matthews is single.  She has two children ages 86  and 60 who are healthy.  She lives with her mom.  She is employed as a  Writer.  She has a remote history of tobacco use.  Denies any alcohol or drug use.   REVIEW OF SYSTEMS:  CONSTITUTIONAL:  Weight is steadily decreasing; she  has lost 20 pounds in the last 2 months, some of this has been  intentional.  She denies any fatigue, fever, or chills.  CARDIOVASCULAR:  Denies chest pain or palpations.  PULMONARY:  Denies shortness of  breath,dyspnea, cough, hemoptysis.  GI:  See HPI.  She denies any  heartburn, indigestion, dysphagia, odynophagia, anorexia, early satiety.   PHYSICAL EXAMINATION:  VITAL SIGNS:  Weight 390 pounds stated, height 63  inches, temp 98.1, blood pressure 102/70 and pulse 80.  GENERAL:  Isabella Matthews is an obese female who is alert, oriented,  pleasant and cooperative in no acute distress.  HEENT:  Sclerae are clear.  Conjunctivae pink.  Oropharynx pink and  moist without any lesions.  NECK:  Supple with no masses or thyromegaly.  CHEST:  Heart regular rate and rhythm.  Normal S1 and S2 without  murmurs, clicks, rubs, or gallops.  LUNGS:  Clear to auscultation bilaterally.  ABDOMEN:  Positive bowel sounds x4.  No bruits auscultated.  Soft,  nontender, nondistended without palpable hepatosplenomegaly.  No rebound  tenderness or guarding.  EXTREMITIES:  Without clubbing but she does have 2+ pretibial edema  bilaterally.  RECTAL:  Deferred.   IMPRESSION:  Isabella Matthews is a 40 year old female with a 6-week history  of severe proctalgia and moderate to large volume hematochezia.  I  suspect she may have benign anorectal source, possibly a fissure,  however, she has not responded to conservative treatment including  Anusol suppositories, creams, and Tucks pads.  She is going to need  diagnostic colonoscopy for further evaluation to rule out colorectal  carcinoma.   PLAN:  1. Diagnostic colonoscopy with Dr. Jena Gauss in the near future.  I have      discussed the procedure including the risks and benefits which      include but are not limited to bleeding, infection, perforation,      drug reaction.  She agrees with plan and consent will be obtained.  2. Further recommendations pending procedure.   I would like to thank Dr. Erby Pian for allowing [place] to  participate in the  care of Isabella Matthews.      Nicholas Lose, N.P.      Jonathon Bellows, M.D.  Electronically Signed    KC/MEDQ  D:  03/26/2006  T:  03/26/2006  Job:  [id number]   cc:   Franchot Heidelberg, M.D.

## 2010-07-05 ENCOUNTER — Other Ambulatory Visit: Payer: Self-pay | Admitting: *Deleted

## 2010-07-05 MED ORDER — HYDROCHLOROTHIAZIDE 25 MG PO TABS: 25.0000 mg | ORAL_TABLET | Freq: Every day | ORAL | Status: DC

## 2010-07-05 MED ORDER — LOVASTATIN 40 MG PO TABS: 40.0000 mg | ORAL_TABLET | Freq: Every day | ORAL | Status: DC

## 2010-07-05 MED ORDER — LISINOPRIL 20 MG PO TABS: 20.0000 mg | ORAL_TABLET | Freq: Every day | ORAL | Status: DC

## 2010-08-04 ENCOUNTER — Encounter: Payer: Self-pay | Admitting: Family Medicine

## 2010-08-09 LAB — LIPID PANEL
Cholesterol: 149 mg/dL (ref 0–200)
HDL: 49 mg/dL (ref >39)
Total CHOL/HDL Ratio: 3 Ratio
Triglycerides: 94 mg/dL (ref <150)
VLDL: 19 mg/dL (ref 0–40)

## 2010-08-09 LAB — BASIC METABOLIC PANEL
CO2: 21 mEq/L (ref 19–32)
Chloride: 104 mEq/L (ref 96–112)
Sodium: 133 mEq/L — ABNORMAL LOW (ref 135–145)

## 2010-08-09 LAB — HEPATIC FUNCTION PANEL
ALT: 12 U/L (ref 0–53)
Total Protein: 7.2 g/dL (ref 6.0–8.3)

## 2010-08-12 ENCOUNTER — Encounter: Payer: Self-pay | Admitting: Family Medicine

## 2010-08-12 ENCOUNTER — Ambulatory Visit (INDEPENDENT_AMBULATORY_CARE_PROVIDER_SITE_OTHER): Payer: Managed Care, Other (non HMO) | Admitting: Family Medicine

## 2010-08-12 VITALS — BP 120/80 | HR 83 | Resp 16 | Ht 64.5 in | Wt 360.0 lb

## 2010-08-12 DIAGNOSIS — E785 Hyperlipidemia, unspecified: Secondary | ICD-10-CM

## 2010-08-12 DIAGNOSIS — I10 Essential (primary) hypertension: Secondary | ICD-10-CM

## 2010-08-12 DIAGNOSIS — Z23 Encounter for immunization: Secondary | ICD-10-CM

## 2010-08-12 MED ORDER — INSULIN DETEMIR 100 UNIT/ML ~~LOC~~ SOLN: 50.0000 [IU] | Freq: Every day | SUBCUTANEOUS | Status: DC

## 2010-08-12 MED ORDER — GLIMEPIRIDE 4 MG PO TABS: 4.0000 mg | ORAL_TABLET | Freq: Every day | ORAL | Status: DC

## 2010-08-12 MED ORDER — HYDROCHLOROTHIAZIDE 25 MG PO TABS: 25.0000 mg | ORAL_TABLET | Freq: Every day | ORAL | Status: DC

## 2010-08-12 NOTE — Patient Instructions (Addendum)
cPE in 3.5 months.  It is important that you exercise regularly at least 30 minutes 5 times a week. If you develop chest pain, have severe difficulty breathing, or feel very tired, stop exercising immediately and seek medical attention    A healthy diet is rich in fruit, vegetables and whole grains. Poultry fish, nuts and beans are a healthy choice for protein rather then red meat. A low sodium diet and drinking 64 ounces of water daily is generally recommended. Oils and sweet should be limited. Carbohydrates especially for those who are diabetic or overweight, should be limited to 34-45 gram per meal. It is important to eat on a regular schedule, at least 3 times daily. Snacks should be primarily fruits, vegetables or nuts.   pls plan to lose 10 pounds in the next 3.5 months   Increase the levimir to 45 then 48 then then 50 units if needed.   Target fastings should be avg 100 to 125  pls reduce fried and fatty foods.your bad cholesterol needs to be lower , around 70  HBA1C and chem 7 just before next visit.goal of less than 7 for hBA1C yours is now 7.5  congrats on improved blood sugars, blood pressure is excellent also  You need to sched an eye exam  tdap today

## 2010-08-13 NOTE — Progress Notes (Signed)
  Subjective:    Patient ID: Isabella Matthews, female    DOB: [birth year], 40 y.o.   MRN: [id number]  HPI HYPERTENSION Disease Monitoring Blood pressure range-unknown Chest pain- no      Dyspnea- no Medications Compliance- good Lightheadedness- no   Edema- no   DIABETES Disease Monitoring Blood Sugar ranges-130 to 150 fasting Polyuria- no New Visual problems- no, need eye exam Medications Compliance- good  Hypoglycemic symptoms- no   HYPERLIPIDEMIA Disease Monitoring See symptoms for Hypertension Medications Compliance- good RUQ pain- no  Muscle aches- no    Review of Systems Denies recent fever or chills. Denies sinus pressure, nasal congestion, ear pain or sore throat. Denies chest congestion, productive cough or wheezing. Denies chest pains, palpitations, paroxysmal nocturnal dyspnea, orthopnea and leg swelling Denies abdominal pain, nausea, vomiting,diarrhea or constipation.  Denies rectal bleeding or change in bowel movement. Denies dysuria, frequency, hesitancy or incontinence. Denies joint pain, swelling and limitation in mobility. Denies headaches, seizure, numbness, or tingling. Denies depression, anxiety or insomnia. Denies skin break down or rash. Plans to commit to regular exercise and greater attempt at weight loss through dietary modification       Objective:   Physical Exam Patient alert and oriented and in no Cardiopulmonary distress.  HEENT: No facial asymmetry, EOMI, no sinus tenderness, TM's clear, Oropharynx pink and moist.  Neck supple no adenopathy.  Chest: Clear to auscultation bilaterally.  CVS: S1, S2 no murmurs, no S3.  ABD: Soft non tender. Bowel sounds normal.  Ext: No edema  MS: Adequate ROM spine, shoulders, hips and knees.  Skin: Intact, no ulcerations or rash noted.  Psych: Good eye contact, normal affect. Memory intact not anxious or depressed appearing.  CNS: CN 2-12 intact, power, tone and sensation normal  throughout. Diabetic Foot Check:  Appearance - no lesions or ulcers, calluses present on both feet Skin - no unusual pallor or redness Sensation - grossly intact to light touch Monofilament testing -  Right - Great toe, medial, central, lateral ball and posterior foot intact Left - Great toe, medial, central, lateral ball and posterior foot intact Pulses Left - Dorsalis Pedis and Posterior Tibia normal Right - Dorsalis Pedis and Posterior Tibia normal        Assessment & Plan:

## 2010-08-13 NOTE — Assessment & Plan Note (Signed)
Controlled, no change in medication  

## 2010-08-13 NOTE — Assessment & Plan Note (Signed)
Improved, though not at goal with LDL, lifestyle modification only, no med change

## 2010-08-13 NOTE — Assessment & Plan Note (Signed)
Unchanged, needs to work on this 

## 2010-08-13 NOTE — Assessment & Plan Note (Signed)
Improved, pt to titrate up medication, she understands, she will also reduce carb intake and start regular exercise

## 2010-09-08 ENCOUNTER — Other Ambulatory Visit: Payer: Self-pay | Admitting: Family Medicine

## 2010-11-02 ENCOUNTER — Other Ambulatory Visit: Payer: Self-pay | Admitting: Family Medicine

## 2010-12-12 ENCOUNTER — Encounter: Payer: Self-pay | Admitting: Family Medicine

## 2010-12-20 ENCOUNTER — Encounter: Payer: Self-pay | Admitting: Family Medicine

## 2010-12-20 ENCOUNTER — Ambulatory Visit (INDEPENDENT_AMBULATORY_CARE_PROVIDER_SITE_OTHER): Payer: Managed Care, Other (non HMO) | Admitting: Family Medicine

## 2010-12-20 ENCOUNTER — Other Ambulatory Visit (HOSPITAL_COMMUNITY)
Admission: RE | Admit: 2010-12-20 | Discharge: 2010-12-20 | Disposition: A | Discharge status: Home / Self Care | Payer: Managed Care, Other (non HMO) | Source: Ambulatory Visit | Attending: Family Medicine | Admitting: Family Medicine

## 2010-12-20 VITALS — BP 130/84 | HR 81 | Resp 16 | Ht 64.5 in | Wt 376.1 lb

## 2010-12-20 DIAGNOSIS — Z1211 Encounter for screening for malignant neoplasm of colon: Secondary | ICD-10-CM

## 2010-12-20 DIAGNOSIS — Z0000 Encounter for general adult medical examination without abnormal findings: Secondary | ICD-10-CM

## 2010-12-20 DIAGNOSIS — B369 Superficial mycosis, unspecified: Secondary | ICD-10-CM

## 2010-12-20 DIAGNOSIS — E119 Type 2 diabetes mellitus without complications: Secondary | ICD-10-CM

## 2010-12-20 DIAGNOSIS — I10 Essential (primary) hypertension: Secondary | ICD-10-CM

## 2010-12-20 DIAGNOSIS — Z01419 Encounter for gynecological examination (general) (routine) without abnormal findings: Secondary | ICD-10-CM | POA: Insufficient documentation

## 2010-12-20 DIAGNOSIS — Z124 Encounter for screening for malignant neoplasm of cervix: Secondary | ICD-10-CM

## 2010-12-20 LAB — HEMOCCULT GUIAC POC 1CARD (OFFICE)

## 2010-12-20 NOTE — Patient Instructions (Addendum)
F/U in 3.5 month  HBA1C and chem 7 in 3.5 months BEFORE visit  We will call re lab results if needed.  It is important that you exercise regularly at least 30 minutes 5 times a week. If you develop chest pain, have severe difficulty breathing, or feel very tired, stop exercising immediately and seek medical attention   A healthy diet is rich in fruit, vegetables and whole grains. Poultry fish, nuts and beans are a healthy choice for protein rather then red meat. A low sodium diet and drinking 64 ounces of water daily is generally recommended. Oils and sweet should be limited. Carbohydrates especially for those who are diabetic or overweight, should be limited to 30-45 gram per meal. It is important to eat on a regular schedule, at least 3 times daily. Snacks should be primarily fruits, vegetables or nuts.   Please work on reducing caloric intake so that you lose weight  I will attempt to get levemir for you to assist you short term, will call when samples obtained  I will f/u on colonscopy

## 2010-12-20 NOTE — Progress Notes (Signed)
  Subjective:    Patient ID: Isabella Matthews, female    DOB: [birth year], 40 y.o.   MRN: [id number]  HPI The PT is here for annual exam  and re-evaluation of chronic medical conditions, medication management and review of any available recent lab and radiology data.  Preventive health is updated, specifically  Cancer screening and Immunization.   Questions or concerns regarding consultations or procedures which the PT has had in the interim are  addressed. The PT denies any adverse reactions to current medications since the last visit.  She has had increased financial stress in the past month and has been off of some of her medication as a result. Blood sugars have become elevated and uncontrolled     Review of Systems See HPI Denies recent fever or chills. Denies sinus pressure, nasal congestion, ear pain or sore throat. Denies chest congestion, productive cough or wheezing. Denies chest pains, palpitations and leg swelling Denies abdominal pain, nausea, vomiting,diarrhea or constipation.   Denies dysuria, frequency, hesitancy or incontinence. Denies joint pain, has some  limitation in mobility a lot of which is due to morbid obesity Denies headaches, seizures, numbness, or tingling. Denies depression,  or insomnia.Mild anxiety, though controlled, due to poor financial situation Denies skin break down or rash.        Objective:   Physical Exam  Pleasant morbidly obese female, alert and oriented x 3, in no cardio-pulmonary distress. Afebrile. HEENT No facial trauma or asymetry. Sinuses non tender.  EOMI, PERTL, fundoscopic exam  no hemorhage or exudate.  External ears normal, tympanic membranes clear. Oropharynx moist, no exudate, good dentition. Neck: supple, no adenopathy,JVD or thyromegaly.No bruits.  Chest: Clear to ascultation bilaterally.No crackles or wheezes. Non tender to palpation  Breast: No asymetry,no masses. No nipple discharge or inversion. No axillary  or supraclavicular adenopathy  Cardiovascular system; Heart sounds normal,  S1 and  S2 ,no S3.  No murmur, or thrill. Apical beat not displaced Peripheral pulses normal.  Abdomen: Soft, non tender, no organomegaly or masses. No bruits. Bowel sounds normal. No guarding, tenderness or rebound.  Rectal:  No mass. Guaiac negative stool.  GU: External genitalia normal. No lesions. Vaginal canal normal.No discharge. Uterus normal size, no adnexal masses, no cervical motion or adnexal tenderness. Exam limited due to habitus  Musculoskeletal exam: Full though reduced  ROM of spine, hips , shoulders and knees. No deformity ,swelling or crepitus noted. No muscle wasting or atrophy.   Neurologic: Cranial nerves 2 to 12 intact. Power, tone ,sensation and reflexes normal throughout. No disturbance in gait. No tremor.  Skin: Intact, no ulceration, erythema , scaling or rash noted. Pigmentation normal throughout  Psych; Normal mood and affect. Judgement and concentration normal  Diabetic Foot Check:  Appearance - tinea pedis, onychomycosis and  calluses Skin - no unusual pallor or redness Sensation - grossly intact to light touch Monofilament testing -  Right - Great toe, medial, central, lateral ball and posterior foot diminished Left - Great toe, medial, central, lateral ball and posterior foot diminished Pulses Left - Dorsalis Pedis and Posterior Tibia normal Right - Dorsalis Pedis and Posterior Tibia normal      Assessment & Plan:

## 2010-12-21 LAB — BASIC METABOLIC PANEL
CO2: 24 mEq/L (ref 19–32)
Calcium: 9.7 mg/dL (ref 8.4–10.5)
Chloride: 100 mEq/L (ref 96–112)
Glucose, Bld: 315 mg/dL — ABNORMAL HIGH (ref 70–99)
Sodium: 138 mEq/L (ref 135–145)

## 2010-12-25 NOTE — Assessment & Plan Note (Signed)
Deteriorated and uncontrolled, financial difficulty causing problems with obtaining medication

## 2010-12-25 NOTE — Assessment & Plan Note (Signed)
improved

## 2010-12-25 NOTE — Assessment & Plan Note (Signed)
Controlled, no change in medication Lifestyle change to get diastolic below 80

## 2010-12-25 NOTE — Assessment & Plan Note (Signed)
Deteriorated. Patient re-educated about  the importance of commitment to a  minimum of 150 minutes of exercise per week. The importance of healthy food choices with portion control discussed. Encouraged to start a food diary, count calories and to consider  joining a support group. Sample diet sheets offered. Goals set by the patient for the next several months.    

## 2011-01-06 ENCOUNTER — Other Ambulatory Visit: Payer: Self-pay

## 2011-01-09 ENCOUNTER — Other Ambulatory Visit: Payer: Self-pay

## 2011-01-09 MED ORDER — INSULIN DETEMIR 100 UNIT/ML ~~LOC~~ SOLN: 50.0000 [IU] | Freq: Every day | SUBCUTANEOUS | Status: DC

## 2011-04-06 ENCOUNTER — Other Ambulatory Visit: Payer: Self-pay | Admitting: Family Medicine

## 2011-04-20 ENCOUNTER — Ambulatory Visit (INDEPENDENT_AMBULATORY_CARE_PROVIDER_SITE_OTHER): Payer: Managed Care, Other (non HMO) | Admitting: Family Medicine

## 2011-04-20 ENCOUNTER — Other Ambulatory Visit: Payer: Self-pay | Admitting: Family Medicine

## 2011-04-20 ENCOUNTER — Encounter: Payer: Self-pay | Admitting: Family Medicine

## 2011-04-20 VITALS — BP 110/60 | HR 109 | Resp 15 | Ht 64.5 in | Wt 358.1 lb

## 2011-04-20 DIAGNOSIS — Z139 Encounter for screening, unspecified: Secondary | ICD-10-CM

## 2011-04-20 DIAGNOSIS — R5383 Other fatigue: Secondary | ICD-10-CM

## 2011-04-20 DIAGNOSIS — E785 Hyperlipidemia, unspecified: Secondary | ICD-10-CM

## 2011-04-20 DIAGNOSIS — IMO0001 Reserved for inherently not codable concepts without codable children: Secondary | ICD-10-CM

## 2011-04-20 DIAGNOSIS — I10 Essential (primary) hypertension: Secondary | ICD-10-CM

## 2011-04-20 DIAGNOSIS — E119 Type 2 diabetes mellitus without complications: Secondary | ICD-10-CM

## 2011-04-20 LAB — BASIC METABOLIC PANEL
BUN: 25 mg/dL — ABNORMAL HIGH (ref 6–23)
CO2: 25 mEq/L (ref 19–32)
Calcium: 9.7 mg/dL (ref 8.4–10.5)
Chloride: 101 mEq/L (ref 96–112)
Creat: 1.1 mg/dL (ref 0.50–1.10)

## 2011-04-20 LAB — CBC WITH DIFFERENTIAL/PLATELET
Basophils Relative: 0 % (ref 0–1)
Eosinophils Absolute: 0.2 10*3/uL (ref 0.0–0.7)
Eosinophils Relative: 2 % (ref 0–5)
Lymphs Abs: 2.6 10*3/uL (ref 0.7–4.0)
MCH: 28.3 pg (ref 26.0–34.0)
MCHC: 30.9 g/dL (ref 30.0–36.0)
MCV: 91.7 fL (ref 78.0–100.0)
Neutrophils Relative %: 61 % (ref 43–77)
Platelets: 349 10*3/uL (ref 150–400)
RBC: 3.74 MIL/uL — ABNORMAL LOW (ref 3.87–5.11)

## 2011-04-20 LAB — HEMOGLOBIN A1C: Hgb A1c MFr Bld: 8.2 % — ABNORMAL HIGH (ref <5.7)

## 2011-04-20 LAB — TSH: TSH: 0.437 u[IU]/mL (ref 0.350–4.500)

## 2011-04-20 MED ORDER — LISINOPRIL 20 MG PO TABS: 20.0000 mg | ORAL_TABLET | Freq: Two times a day (BID) | ORAL | Status: DC

## 2011-04-20 MED ORDER — GLIMEPIRIDE 4 MG PO TABS: ORAL_TABLET | ORAL | Status: DC

## 2011-04-20 MED ORDER — LOVASTATIN 20 MG PO TABS: ORAL_TABLET | ORAL | Status: DC

## 2011-04-20 NOTE — Assessment & Plan Note (Signed)
Controlled, no change in medication  

## 2011-04-20 NOTE — Assessment & Plan Note (Signed)
Inadequate control by history, will f/u closely on HBA1C. Importance of daily testing and lifestyle change stressed

## 2011-04-20 NOTE — Patient Instructions (Addendum)
F/u in 4 months  HBA1C today also cbc and tsh and chem 7   Fasting lipid, cmp and EGFR, HBa1C , microalb in 4 months BEFORE VISIT  It is important that you exercise regularly at least 30 minutes 5 times a week. If you develop chest pain, have severe difficulty breathing, or feel very tired, stop exercising immediately and seek medical attention    A healthy diet is rich in fruit, vegetables and whole grains. Poultry fish, nuts and beans are a healthy choice for protein rather then red meat. A low sodium diet and drinking 64 ounces of water daily is generally recommended. Oils and sweet should be limited. Carbohydrates especially for those who are diabetic or overweight, should be limited to 34-45 gram per meal. It is important to eat on a regular schedule, at least 3 times daily. Snacks should be primarily fruits, vegetables or nuts.   You will have mammogram scheduled , and will be referred for eye exam   Goal for fasting blood sugar ranges from 80 to 120 and 2 hours after any meal or at bedtime should be between 130 to 170.Increase levimir dose to 40 units daily

## 2011-04-20 NOTE — Assessment & Plan Note (Signed)
Controlled, no change in medication Hyperlipidemia:Low fat diet discussed and encouraged.  Will recheck lab in 3 month

## 2011-04-20 NOTE — Assessment & Plan Note (Signed)
Unchanged. Patient re-educated about  the importance of commitment to a  minimum of 150 minutes of exercise per week. The importance of healthy food choices with portion control discussed. Encouraged to start a food diary, count calories and to consider  joining a support group. Sample diet sheets offered. Goals set by the patient for the next several months.    

## 2011-04-20 NOTE — Progress Notes (Signed)
  Subjective:    Patient ID: Isabella Matthews, female    DOB: Jun 21, 1970, 41 y.o.   MRN: [id number]  HPI The PT is here for follow up and re-evaluation of chronic medical conditions, medication management and review of any available recent lab and radiology data.  Preventive health is updated, specifically  Cancer screening and Immunization.   Questions or concerns regarding consultations or procedures which the PT has had in the interim are  addressed. The PT denies any adverse reactions to current medications since the last visit.  There are no new concerns. Needs referral for ey exam There are no specific complaints  Not testing blood sugars as regularly as she should, reports fasting sugars of about 150 No consistent attention to weight loss through lifestyle change     Review of Systems See HPI Denies recent fever or chills. Denies sinus pressure, nasal congestion, ear pain or sore throat. Denies chest congestion, productive cough or wheezing. Denies chest pains, palpitations and leg swelling Denies abdominal pain, nausea, vomiting,diarrhea or constipation.   Denies dysuria, frequency, hesitancy or incontinence. Denies joint pain, swelling and limitation in mobility. Denies headaches, seizures, numbness, or tingling. Denies depression, anxiety or insomnia. Denies skin break down or rash.        Objective:   Physical Exam  Patient alert and oriented and in no cardiopulmonary distress.Morbidly obese  HEENT: No facial asymmetry, EOMI, no sinus tenderness,  oropharynx pink and moist.  Neck supple no adenopathy.  Chest: Clear to auscultation bilaterally.  CVS: S1, S2 no murmurs, no S3.  ABD: Soft non tender. Bowel sounds normal.  Ext: No edema  MS: Adequate though reduced  ROM spine, shoulders, hips and knees.  Skin: Intact, no ulcerations or rash noted.  Psych: Good eye contact, normal affect. Memory intact not anxious or depressed appearing.  CNS: CN 2-12  intact, power, tone and sensation normal throughout.       Assessment & Plan:

## 2011-05-22 ENCOUNTER — Ambulatory Visit (HOSPITAL_COMMUNITY)
Admission: RE | Admit: 2011-05-22 | Discharge: 2011-05-22 | Disposition: A | Discharge status: Home / Self Care | Payer: Managed Care, Other (non HMO) | Source: Ambulatory Visit | Attending: Family Medicine | Admitting: Family Medicine

## 2011-05-22 DIAGNOSIS — Z1231 Encounter for screening mammogram for malignant neoplasm of breast: Secondary | ICD-10-CM | POA: Insufficient documentation

## 2011-05-22 DIAGNOSIS — Z139 Encounter for screening, unspecified: Secondary | ICD-10-CM

## 2011-08-16 LAB — COMPLETE METABOLIC PANEL WITH GFR
ALT: 15 U/L (ref 0–35)
CO2: 21 mEq/L (ref 19–32)
Calcium: 10 mg/dL (ref 8.4–10.5)
Chloride: 102 mEq/L (ref 96–112)
GFR, Est African American: >89 mL/min
GFR, Est Non African American: 82 mL/min
Glucose, Bld: 169 mg/dL — ABNORMAL HIGH (ref 70–99)
Sodium: 137 mEq/L (ref 135–145)
Total Protein: 8.3 g/dL (ref 6.0–8.3)

## 2011-08-16 LAB — LIPID PANEL
LDL Cholesterol: 115 mg/dL — ABNORMAL HIGH (ref 0–99)
Triglycerides: 98 mg/dL (ref <150)

## 2011-08-21 ENCOUNTER — Other Ambulatory Visit: Payer: Self-pay | Admitting: Family Medicine

## 2011-08-21 ENCOUNTER — Ambulatory Visit (INDEPENDENT_AMBULATORY_CARE_PROVIDER_SITE_OTHER): Payer: Managed Care, Other (non HMO) | Admitting: Family Medicine

## 2011-08-21 ENCOUNTER — Encounter: Payer: Self-pay | Admitting: Family Medicine

## 2011-08-21 VITALS — BP 124/84 | HR 83 | Resp 16 | Ht 64.5 in | Wt 367.1 lb

## 2011-08-21 DIAGNOSIS — E119 Type 2 diabetes mellitus without complications: Secondary | ICD-10-CM

## 2011-08-21 DIAGNOSIS — R5381 Other malaise: Secondary | ICD-10-CM

## 2011-08-21 DIAGNOSIS — I10 Essential (primary) hypertension: Secondary | ICD-10-CM

## 2011-08-21 DIAGNOSIS — E785 Hyperlipidemia, unspecified: Secondary | ICD-10-CM

## 2011-08-21 DIAGNOSIS — IMO0001 Reserved for inherently not codable concepts without codable children: Secondary | ICD-10-CM

## 2011-08-21 MED ORDER — LOVASTATIN 40 MG PO TABS: 40.0000 mg | ORAL_TABLET | Freq: Every day | ORAL | Status: DC

## 2011-08-21 MED ORDER — SITAGLIPTIN PHOSPHATE 100 MG PO TABS: 100.0000 mg | ORAL_TABLET | Freq: Every day | ORAL | Status: DC

## 2011-08-21 NOTE — Patient Instructions (Addendum)
CPE in first week in December.  You need to test blood sugars every day, may test up to twice daily, your blood sugar has improved, but is still uncontrolled.  Goal for fasting blood sugar ranges from 80 to 120 and 2 hours after any meal or at bedtime should be between 130 to 170.  New additional medication for blood sugar januvia, you will also get a coupon.  It is important that you exercise regularly at least 30 minutes 5 times a week. If you develop chest pain, have severe difficulty breathing, or feel very tired, stop exercising immediately and seek medical attention   A healthy diet is rich in fruit, vegetables and whole grains. Poultry fish, nuts and beans are a healthy choice for protein rather then red meat. A low sodium diet and drinking 64 ounces of water daily is generally recommended. Oils and sweet should be limited. Carbohydrates especially for those who are diabetic or overweight, should be limited to 30-45 gram per meal. It is important to eat on a regular schedule, at least 3 times daily. Snacks should be primarily fruits, vegetables or nuts.  You will get info on 15g carbohydrate portions to help  Weight loss goal of 2 to 3 pounds per month   You need to reduce fat intake , bad cholesterol is too high  Fasting lipid, chem 7 and hBA1c 3 to 5 days before next visit

## 2011-08-22 ENCOUNTER — Telehealth: Payer: Self-pay | Admitting: Family Medicine

## 2011-08-22 ENCOUNTER — Other Ambulatory Visit: Payer: Self-pay

## 2011-08-22 MED ORDER — LISINOPRIL 20 MG PO TABS: 20.0000 mg | ORAL_TABLET | Freq: Two times a day (BID) | ORAL | Status: DC

## 2011-08-22 NOTE — Progress Notes (Signed)
  Subjective:    Patient ID: Isabella Matthews, female    DOB: November 08, 1970, 41 y.o.   MRN: [id number]  HPI The PT is here for follow up and re-evaluation of chronic medical conditions, medication management and review of any available recent lab and radiology data.  Preventive health is updated, specifically  Cancer screening and Immunization.   Questions or concerns regarding consultations or procedures which the PT has had in the interim are  addressed. The PT denies any adverse reactions to current medications since the last visit.  She has not been testing her sugars regularly, and when she does they are high. No regular exercise and not carb counting. She has had her eye exam.        Review of Systems See HPI Denies recent fever or chills. Denies sinus pressure, nasal congestion, ear pain or sore throat. Denies chest congestion, productive cough or wheezing. Denies chest pains, palpitations and leg swelling Denies abdominal pain, nausea, vomiting,diarrhea or constipation.   Denies dysuria, frequency, hesitancy or incontinence. Denies joint pain, swelling and limitation in mobility. Denies headaches, seizures, numbness, or tingling. Denies depression, anxiety or insomnia. Denies skin break down or rash.       Objective:   Physical Exam  Patient alert and oriented and in no cardiopulmonary distress.  HEENT: No facial asymmetry, EOMI, no sinus tenderness,  oropharynx pink and moist.  Neck supple no adenopathy.  Chest: Clear to auscultation bilaterally.  CVS: S1, S2 no murmurs, no S3.  ABD: Soft non tender. Bowel sounds normal.  Ext: No edema  MS: Adequate ROM spine, shoulders, hips and knees.  Skin: Intact, no ulcerations or rash noted.  Psych: Good eye contact, normal affect. Memory intact not anxious or depressed appearing.  CNS: CN 2-12 intact, power, tone and sensation normal throughout. Diabetic Foot Check:  Appearance - no lesions, ulcers or  calluses Skin - no unusual pallor or redness Sensation - grossly intact to light touch Monofilament testing -  Right - Great toe, medial, central, lateral ball and posterior foot intact Left - Great toe, medial, central, lateral ball and posterior foot intact Pulses Left - Dorsalis Pedis and Posterior Tibia normal Right - Dorsalis Pedis and Posterior Tibia normal       Assessment & Plan:

## 2011-08-22 NOTE — Assessment & Plan Note (Signed)
Controlled, no change in medication DASH diet and commitment to daily physical activity for a minimum of 30 minutes discussed and encouraged, as a part of hypertension management. The importance of attaining a healthy weight is also discussed.  

## 2011-08-22 NOTE — Assessment & Plan Note (Signed)
Uncontrolled, bad cholesterol elevated, dietary change only, no change in medication at this time

## 2011-08-22 NOTE — Assessment & Plan Note (Signed)
Deteriorated. Patient re-educated about  the importance of commitment to a  minimum of 150 minutes of exercise per week. The importance of healthy food choices with portion control discussed. Encouraged to start a food diary, count calories and to consider  joining a support group. Sample diet sheets offered. Goals set by the patient for the next several months.    

## 2011-08-22 NOTE — Assessment & Plan Note (Signed)
Improved but still uncontrolled, needs to re address dietary habits and also will add [place].  Needs to start regular physical activity

## 2011-08-24 ENCOUNTER — Other Ambulatory Visit: Payer: Self-pay

## 2011-08-24 ENCOUNTER — Other Ambulatory Visit: Payer: Self-pay | Admitting: Family Medicine

## 2011-08-24 DIAGNOSIS — E785 Hyperlipidemia, unspecified: Secondary | ICD-10-CM

## 2011-08-24 MED ORDER — LOVASTATIN 40 MG PO TABS: 20.0000 mg | ORAL_TABLET | Freq: Every day | ORAL | Status: DC

## 2011-08-24 MED ORDER — LISINOPRIL 20 MG PO TABS: 20.0000 mg | ORAL_TABLET | Freq: Two times a day (BID) | ORAL | Status: DC

## 2011-08-24 NOTE — Telephone Encounter (Signed)
Pt would like to know if you will change Lisinopril back to 20mg  and let her take two tablets daily with a 90 day supply because the cost would be cheaper for her.

## 2011-08-24 NOTE — Telephone Encounter (Signed)
Pt aware.

## 2011-08-24 NOTE — Telephone Encounter (Signed)
I re ordered the 20mg  tablet, pl,ease let her know 3 month supply sent in

## 2011-08-24 NOTE — Telephone Encounter (Signed)
Yes no probl;em I will print script fax after you speak with her please

## 2011-12-27 ENCOUNTER — Encounter: Payer: Managed Care, Other (non HMO) | Admitting: Family Medicine

## 2012-03-19 ENCOUNTER — Other Ambulatory Visit: Payer: Self-pay | Admitting: Family Medicine

## 2012-04-17 ENCOUNTER — Ambulatory Visit (INDEPENDENT_AMBULATORY_CARE_PROVIDER_SITE_OTHER): Payer: Managed Care, Other (non HMO) | Admitting: Family Medicine

## 2012-04-17 ENCOUNTER — Encounter: Payer: Self-pay | Admitting: Family Medicine

## 2012-04-17 VITALS — BP 118/80 | HR 104 | Resp 16 | Ht 64.5 in | Wt 344.0 lb

## 2012-04-17 DIAGNOSIS — G8929 Other chronic pain: Secondary | ICD-10-CM

## 2012-04-17 DIAGNOSIS — R5383 Other fatigue: Secondary | ICD-10-CM

## 2012-04-17 DIAGNOSIS — K3189 Other diseases of stomach and duodenum: Secondary | ICD-10-CM

## 2012-04-17 DIAGNOSIS — I10 Essential (primary) hypertension: Secondary | ICD-10-CM

## 2012-04-17 DIAGNOSIS — R1013 Epigastric pain: Secondary | ICD-10-CM

## 2012-04-17 DIAGNOSIS — R51 Headache: Secondary | ICD-10-CM

## 2012-04-17 DIAGNOSIS — E119 Type 2 diabetes mellitus without complications: Secondary | ICD-10-CM

## 2012-04-17 DIAGNOSIS — E785 Hyperlipidemia, unspecified: Secondary | ICD-10-CM

## 2012-04-17 DIAGNOSIS — R5381 Other malaise: Secondary | ICD-10-CM

## 2012-04-17 DIAGNOSIS — F17200 Nicotine dependence, unspecified, uncomplicated: Secondary | ICD-10-CM

## 2012-04-17 DIAGNOSIS — R1011 Right upper quadrant pain: Secondary | ICD-10-CM

## 2012-04-17 DIAGNOSIS — IMO0001 Reserved for inherently not codable concepts without codable children: Secondary | ICD-10-CM

## 2012-04-17 MED ORDER — SITAGLIPTIN PHOSPHATE 100 MG PO TABS: 100.0000 mg | ORAL_TABLET | Freq: Every day | ORAL | Status: DC

## 2012-04-17 MED ORDER — HYDROCHLOROTHIAZIDE 25 MG PO TABS: ORAL_TABLET | ORAL | Status: DC

## 2012-04-17 MED ORDER — LOVASTATIN 40 MG PO TABS: 20.0000 mg | ORAL_TABLET | Freq: Every day | ORAL | Status: DC

## 2012-04-17 MED ORDER — LISINOPRIL 20 MG PO TABS: 20.0000 mg | ORAL_TABLET | Freq: Two times a day (BID) | ORAL | Status: DC

## 2012-04-17 MED ORDER — GLIMEPIRIDE 4 MG PO TABS: ORAL_TABLET | ORAL | Status: DC

## 2012-04-17 NOTE — Patient Instructions (Addendum)
CPE in 3 months  HBA1C cmp and EGFR  And H pylori non fasting today. Come by office tomorrow by 4;!5 pm to discuss results with nurse please.  All meds will be sent 3 month supply mail order per your request   Fasting lipid and cmp and HBA1C in 3 months, BEFORE next visit  You are referred for gallbladder ultrasound due to h/o abdominal pain  Pls stop handling stress the way you are , as the habits are harmful, smoking and alcohol Call if you want therapy or decide on medication please  Tylenol 2 tablets in office today for headache.  Use OTC ibuprofen 2 tablets up to twice daily for the next 3 days for the headache. I believe itis due to stress and poor sleep

## 2012-04-18 LAB — COMPLETE METABOLIC PANEL WITH GFR
ALT: 21 U/L (ref 0–35)
Alkaline Phosphatase: 69 U/L (ref 39–117)
CO2: 24 mEq/L (ref 19–32)
Creat: 1.18 mg/dL — ABNORMAL HIGH (ref 0.50–1.10)
GFR, Est African American: 65 mL/min
Total Bilirubin: 0.4 mg/dL (ref 0.3–1.2)

## 2012-04-18 LAB — HEMOGLOBIN A1C
Hgb A1c MFr Bld: 9.7 % — ABNORMAL HIGH (ref <5.7)
Mean Plasma Glucose: 232 mg/dL — ABNORMAL HIGH (ref <117)

## 2012-04-18 MED ORDER — INSULIN DETEMIR 100 UNIT/ML ~~LOC~~ SOLN: 75.0000 [IU] | Freq: Every day | SUBCUTANEOUS | Status: DC

## 2012-04-19 NOTE — Progress Notes (Signed)
Patient aware- had not been taking the levemir daily. Will start back at 50 units though the script states 75. Is aware to only do 50. Gave log book to record readings

## 2012-04-23 ENCOUNTER — Ambulatory Visit (HOSPITAL_COMMUNITY)
Admission: RE | Admit: 2012-04-23 | Discharge: 2012-04-23 | Disposition: A | Discharge status: Home / Self Care | Payer: Managed Care, Other (non HMO) | Source: Ambulatory Visit | Attending: Family Medicine | Admitting: Family Medicine

## 2012-04-23 DIAGNOSIS — E119 Type 2 diabetes mellitus without complications: Secondary | ICD-10-CM | POA: Insufficient documentation

## 2012-04-23 DIAGNOSIS — R1011 Right upper quadrant pain: Secondary | ICD-10-CM | POA: Insufficient documentation

## 2012-04-23 DIAGNOSIS — I10 Essential (primary) hypertension: Secondary | ICD-10-CM | POA: Insufficient documentation

## 2012-04-25 ENCOUNTER — Other Ambulatory Visit: Payer: Self-pay

## 2012-04-25 DIAGNOSIS — E785 Hyperlipidemia, unspecified: Secondary | ICD-10-CM

## 2012-04-25 MED ORDER — LISINOPRIL 20 MG PO TABS: 20.0000 mg | ORAL_TABLET | Freq: Two times a day (BID) | ORAL | Status: DC

## 2012-04-25 MED ORDER — GLIMEPIRIDE 4 MG PO TABS: ORAL_TABLET | ORAL | Status: DC

## 2012-04-25 MED ORDER — SITAGLIPTIN PHOSPHATE 100 MG PO TABS: 100.0000 mg | ORAL_TABLET | Freq: Every day | ORAL | Status: DC

## 2012-04-25 MED ORDER — HYDROCHLOROTHIAZIDE 25 MG PO TABS: ORAL_TABLET | ORAL | Status: DC

## 2012-04-25 MED ORDER — LOVASTATIN 40 MG PO TABS: 20.0000 mg | ORAL_TABLET | Freq: Every day | ORAL | Status: DC

## 2012-05-01 ENCOUNTER — Other Ambulatory Visit: Payer: Self-pay

## 2012-05-01 DIAGNOSIS — E785 Hyperlipidemia, unspecified: Secondary | ICD-10-CM

## 2012-05-01 MED ORDER — LOVASTATIN 40 MG PO TABS: ORAL_TABLET | ORAL | Status: DC

## 2012-05-02 ENCOUNTER — Telehealth: Payer: Self-pay | Admitting: Family Medicine

## 2012-05-02 DIAGNOSIS — E785 Hyperlipidemia, unspecified: Secondary | ICD-10-CM

## 2012-05-03 MED ORDER — GLUCOSE BLOOD VI STRP: ORAL_STRIP | Status: DC

## 2012-05-03 MED ORDER — LOVASTATIN 40 MG PO TABS: ORAL_TABLET | ORAL | Status: DC

## 2012-05-03 NOTE — Telephone Encounter (Signed)
meds refilled 

## 2012-05-05 ENCOUNTER — Encounter: Payer: Self-pay | Admitting: Family Medicine

## 2012-05-05 DIAGNOSIS — R519 Headache, unspecified: Secondary | ICD-10-CM | POA: Insufficient documentation

## 2012-05-05 DIAGNOSIS — F17200 Nicotine dependence, unspecified, uncomplicated: Secondary | ICD-10-CM | POA: Insufficient documentation

## 2012-05-05 DIAGNOSIS — R51 Headache: Secondary | ICD-10-CM | POA: Insufficient documentation

## 2012-05-05 NOTE — Assessment & Plan Note (Signed)
Hyperlipidemia:Low fat diet discussed and encouraged. \elevated LDL, no med change at this time, non compliance is a problem

## 2012-05-05 NOTE — Assessment & Plan Note (Signed)
Deteriorated. Patient re-educated about  the importance of commitment to a  minimum of 150 minutes of exercise per week. The importance of healthy food choices with portion control discussed. Encouraged to start a food diary, count calories and to consider  joining a support group. Sample diet sheets offered. Goals set by the patient for the next several months.    

## 2012-05-05 NOTE — Assessment & Plan Note (Signed)
Deteiorated. Patient counseled for approximately 5 minutes regarding the health risks of ongoing nicotine use, specifically all types of cancer, heart disease, stroke and respiratory failure. The options available for help with cessation ,the behavioral changes to assist the process, and the option to either gradully reduce usage  Or abruptly stop.is also discussed. Pt is also encouraged to set specific goals in number of cigarettes used daily, as well as to set a quit date.

## 2012-05-05 NOTE — Progress Notes (Signed)
  Subjective:    Patient ID: Isabella Matthews, female    DOB: [birth year], 42 y.o.   MRN: [id number]  HPI The PT is here for follow up and re-evaluation of chronic medical conditions, medication management and review of any available recent lab and radiology data.  Preventive health is updated, specifically  Cancer screening and Immunization.   Questions or concerns regarding consultations or procedures which the PT has had in the interim are  addressed. The PT denies any adverse reactions to current medications since the last visit.  3 week h/o RUQ pain associated with bloating and nausea C/o headache x 1 week, poor sleep and increased stress over the past several month. Has been smoking cigarettes and using alcohol for stress relief , knows tat both are not good habits Not taking meds as prescribed, not testing sugars as she should , and notes that they are often elevated and fluctuate a lot, states her life is "out of control"       Review of Systems See HPI Denies recent fever or chills. Denies sinus pressure, nasal congestion, ear pain or sore throat. Denies chest congestion, productive cough or wheezing. Denies chest pains, palpitations and leg swelling Denies diarrhea or constipation.   Denies dysuria, frequency, hesitancy or incontinence. Denies joint pain, swelling and limitation in mobility. Denies seizures, numbness, or tingling. . Denies skin break down or rash.        Objective:   Physical Exam  Patient alert and oriented and in no cardiopulmonary distress.  HEENT: No facial asymmetry, EOMI, no sinus tenderness,  oropharynx pink and moist.  Neck supple no adenopathy.  Chest: Clear to auscultation bilaterally.  CVS: S1, S2 no murmurs, no S3.  ABD: Soft non tender. Bowel sounds normal.no organomegaly or masses on palpation  Ext: No edema  MS: Adequate ROM spine, shoulders, hips and knees.  Skin: Intact, no ulcerations or rash noted.  Psych: Good eye  contact, flat affect. Memory intact not anxious but tearful at times and  depressed appearing.  CNS: CN 2-12 intact, power, tone and sensation normal throughout.       Assessment & Plan:

## 2012-05-05 NOTE — Assessment & Plan Note (Signed)
No focal neurologic deficit on exam or in history. Associated with ncrease stress and sleep deprivation. Tylenol given at [facility]

## 2012-05-05 NOTE — Assessment & Plan Note (Signed)
Deteriorated, non comp;liant with meds and diet, stressed Patient advised to reduce carb and sweets, commit to regular physical activity, take meds as prescribed, test blood as directed, and attempt to lose weight, to improve blood sugar control.

## 2012-05-05 NOTE — Assessment & Plan Note (Signed)
3 week h/o recurrent bloating and RUQ pain needs gallbladder eval

## 2012-05-05 NOTE — Assessment & Plan Note (Signed)
Controlled, no change in medication DASH diet and commitment to daily physical activity for a minimum of 30 minutes discussed and encouraged, as a part of hypertension management. The importance of attaining a healthy weight is also discussed.  

## 2012-07-05 LAB — COMPREHENSIVE METABOLIC PANEL
AST: 16 U/L (ref 0–37)
BUN: 16 mg/dL (ref 6–23)
CO2: 23 mEq/L (ref 19–32)
Calcium: 9.9 mg/dL (ref 8.4–10.5)
Chloride: 105 mEq/L (ref 96–112)
Creat: 0.95 mg/dL (ref 0.50–1.10)
Total Bilirubin: 0.7 mg/dL (ref 0.3–1.2)

## 2012-07-05 LAB — LIPID PANEL
Cholesterol: 164 mg/dL (ref 0–200)
HDL: 62 mg/dL (ref >39)
Triglycerides: 87 mg/dL (ref <150)
VLDL: 17 mg/dL (ref 0–40)

## 2012-07-05 LAB — HEMOGLOBIN A1C: Hgb A1c MFr Bld: 8.4 % — ABNORMAL HIGH (ref <5.7)

## 2012-07-12 ENCOUNTER — Ambulatory Visit (INDEPENDENT_AMBULATORY_CARE_PROVIDER_SITE_OTHER): Payer: Managed Care, Other (non HMO) | Admitting: Family Medicine

## 2012-07-12 ENCOUNTER — Encounter: Payer: Self-pay | Admitting: Family Medicine

## 2012-07-12 ENCOUNTER — Other Ambulatory Visit (HOSPITAL_COMMUNITY)
Admission: RE | Admit: 2012-07-12 | Discharge: 2012-07-12 | Disposition: A | Discharge status: Home / Self Care | Payer: Managed Care, Other (non HMO) | Source: Ambulatory Visit | Attending: Family Medicine | Admitting: Family Medicine

## 2012-07-12 VITALS — BP 120/84 | HR 82 | Resp 14 | Wt 341.0 lb

## 2012-07-12 DIAGNOSIS — R059 Cough, unspecified: Secondary | ICD-10-CM | POA: Insufficient documentation

## 2012-07-12 DIAGNOSIS — I10 Essential (primary) hypertension: Secondary | ICD-10-CM

## 2012-07-12 DIAGNOSIS — J309 Allergic rhinitis, unspecified: Secondary | ICD-10-CM | POA: Insufficient documentation

## 2012-07-12 DIAGNOSIS — Z1151 Encounter for screening for human papillomavirus (HPV): Secondary | ICD-10-CM | POA: Insufficient documentation

## 2012-07-12 DIAGNOSIS — Z124 Encounter for screening for malignant neoplasm of cervix: Secondary | ICD-10-CM

## 2012-07-12 DIAGNOSIS — R5383 Other fatigue: Secondary | ICD-10-CM

## 2012-07-12 DIAGNOSIS — E1065 Type 1 diabetes mellitus with hyperglycemia: Secondary | ICD-10-CM

## 2012-07-12 DIAGNOSIS — Z01419 Encounter for gynecological examination (general) (routine) without abnormal findings: Secondary | ICD-10-CM | POA: Insufficient documentation

## 2012-07-12 DIAGNOSIS — Z1211 Encounter for screening for malignant neoplasm of colon: Secondary | ICD-10-CM

## 2012-07-12 DIAGNOSIS — F17200 Nicotine dependence, unspecified, uncomplicated: Secondary | ICD-10-CM

## 2012-07-12 DIAGNOSIS — R5381 Other malaise: Secondary | ICD-10-CM

## 2012-07-12 DIAGNOSIS — R05 Cough: Secondary | ICD-10-CM

## 2012-07-12 DIAGNOSIS — Z0000 Encounter for general adult medical examination without abnormal findings: Secondary | ICD-10-CM

## 2012-07-12 DIAGNOSIS — E785 Hyperlipidemia, unspecified: Secondary | ICD-10-CM

## 2012-07-12 LAB — POC HEMOCCULT BLD/STL (OFFICE/1-CARD/DIAGNOSTIC): Card #1 Date: 6

## 2012-07-12 MED ORDER — LOSARTAN POTASSIUM 50 MG PO TABS: 50.0000 mg | ORAL_TABLET | Freq: Every day | ORAL | Status: DC

## 2012-07-12 MED ORDER — LORATADINE 10 MG PO TABS: 10.0000 mg | ORAL_TABLET | Freq: Every day | ORAL | Status: DC

## 2012-07-12 MED ORDER — DAPAGLIFLOZIN PROPANEDIOL 10 MG PO TABS: 1.0000 | ORAL_TABLET | Freq: Every day | ORAL | Status: AC

## 2012-07-12 MED ORDER — INSULIN DETEMIR 100 UNIT/ML FLEXPEN: PEN_INJECTOR | SUBCUTANEOUS | Status: DC

## 2012-07-12 MED ORDER — BENZONATATE 100 MG PO CAPS: 100.0000 mg | ORAL_CAPSULE | Freq: Three times a day (TID) | ORAL | Status: DC | PRN

## 2012-07-12 MED ORDER — GLIMEPIRIDE 4 MG PO TABS: ORAL_TABLET | ORAL | Status: DC

## 2012-07-12 NOTE — Patient Instructions (Addendum)
F/u in 3.5 month, call if you need me  befopre  Congrats on improved bllood sugar and weight loss, continue to cut out sugar!  New for blood sugar is farxiga one daily, continue other medications as before  Use coupon , should cost nothing for entir year.  New for blood pressure and kidney protection is cozaar since you have been coughing with benazepril  For the cough you have had for past 2 weks, you will get to fill locally loratidine and tessalon perles, call back if cough persists.  Microalb, TSH, CBC, chem 7 and EGFR, HBA1C in 3.5 month, non fasting  You need to stop smoking, this woill improve your health and reduce your risk of further illneses

## 2012-07-12 NOTE — Assessment & Plan Note (Signed)
Ongoing use approx 3 per day, counselled to quit, no commitment, despit chronic cough , at thsi time Patient counseled for approximately 5 minutes regarding the health risks of ongoing nicotine use, specifically all types of cancer, heart disease, stroke and respiratory failure. The options available for help with cessation ,the behavioral changes to assist the process, and the option to either gradully reduce usage  Or abruptly stop.is also discussed. Pt is also encouraged to set specific goals in number of cigarettes used daily, as well as to set a quit date.

## 2012-07-12 NOTE — Progress Notes (Signed)
  Subjective:    Patient ID: Isabella Matthews, female    DOB: Jul 23, 1970, 42 y.o.   MRN: [id number]  HPI The PT is here for annual exam  and re-evaluation of chronic medical conditions, medication management and review of any available recent lab and radiology data.  Preventive health is updated, specifically  Cancer screening and Immunization.   Eye exam is still outstanding and pt non comital in this regard Continues to smoke and c/o 2 week h/ cough and chest congestion, little mucus, denies sinus pressure, cough is worse at night, also experiences tickle in her throat, like cotton for months no fever or chills, other family members are affected Continues to test irregularly    Review of Systems See HPI Denies recent fever or chills. Denies sinus pressure, nasal congestion, ear pain or sore throat.  Denies chest pains, palpitations and leg swelling Denies abdominal pain, nausea, vomiting,diarrhea or constipation.   Denies dysuria, frequency, hesitancy or incontinence. Denies joint pain, swelling and limitation in mobility. Denies headaches, seizures, numbness, or tingling. Denies depression, anxiety or insomnia. Denies skin break down or rash.        Objective:   Physical Exam  Pleasant morbidly obese female, alert and oriented x 3, in no cardio-pulmonary distress. Afebrile. HEENT No facial trauma or asymetry. Sinuses non tender.  EOMI, PERTL, fundoscopic exam is normal, no hemorhage or exudate.  External ears normal, tympanic membranes clear. Oropharynx moist, no exudate, good dentition. Neck: supple, no adenopathy,JVD or thyromegaly.No bruits.  Chest: Clear to ascultation bilaterally.No crackles or wheezes. Non tender to palpation  Breast: No asymetry,no masses. No nipple discharge or inversion. No axillary or supraclavicular adenopathy  Cardiovascular system; Heart sounds normal,  S1 and  S2 ,no S3.  No murmur, or thrill. Apical beat not displaced Peripheral  pulses normal.  Abdomen: Soft, non tender, no organomegaly or masses. No bruits. Bowel sounds normal. No guarding, tenderness or rebound.  Rectal:  No mass. Guaiac negative stool.  GU: External genitalia normal. No lesions. Vaginal canal normal.Physiologic  discharge. Uterus normal size, no adnexal masses, no cervical motion or adnexal tenderness.  Musculoskeletal exam: Decreased  ROM of spine, hips , shoulders and knees. No deformity ,swelling or crepitus noted. No muscle wasting or atrophy.   Neurologic: Cranial nerves 2 to 12 intact. Power, tone ,sensation and reflexes normal throughout. No disturbance in gait. No tremor.  Skin: Intact, no ulceration, erythema , scaling or rash noted. Pigmentation normal throughout  Psych; Normal mood and affect. Judgement and concentration normal .       Assessment & Plan:

## 2012-07-12 NOTE — Assessment & Plan Note (Signed)
Improved, add farxiga Patient advised to reduce carb and sweets, commit to regular physical activity, take meds as prescribed, test blood as directed, and attempt to lose weight, to improve blood sugar control.

## 2012-07-12 NOTE — Assessment & Plan Note (Signed)
Pt appears to have ACE allergy , with chronic tickle in throat. Change to ARB

## 2012-07-12 NOTE — Assessment & Plan Note (Signed)
primarily allergy based, including the possibility of an ACE allergy, symptomatic treatment only at this time, claritin and tessalon perles, pt to call back if symptoms worsen

## 2012-07-12 NOTE — Assessment & Plan Note (Signed)
Improved. Pt applauded on succesful weight loss through lifestyle change, and encouraged to continue same. Weight loss goal set for the next several months.  

## 2012-07-12 NOTE — Assessment & Plan Note (Signed)
Pelvic and breast and rectal as documented  Pt encouraged to continue to reduce sugar and carbohydrate intake, and increase physical activity  Still needs eye exam

## 2012-08-28 ENCOUNTER — Other Ambulatory Visit: Payer: Self-pay

## 2012-10-14 ENCOUNTER — Other Ambulatory Visit: Payer: Self-pay | Admitting: Family Medicine

## 2012-10-14 LAB — CBC WITH DIFFERENTIAL/PLATELET
Eosinophils Absolute: 0.1 10*3/uL (ref 0.0–0.7)
Eosinophils Relative: 2 % (ref 0–5)
HCT: 36.4 % (ref 36.0–46.0)
Hemoglobin: 12.5 g/dL (ref 12.0–15.0)
Lymphocytes Relative: 26 % (ref 12–46)
Lymphs Abs: 2.3 10*3/uL (ref 0.7–4.0)
MCH: 30.6 pg (ref 26.0–34.0)
MCV: 89 fL (ref 78.0–100.0)
Monocytes Absolute: 0.9 10*3/uL (ref 0.1–1.0)
Monocytes Relative: 10 % (ref 3–12)
Platelets: 276 10*3/uL (ref 150–400)
RBC: 4.09 MIL/uL (ref 3.87–5.11)
WBC: 8.7 10*3/uL (ref 4.0–10.5)

## 2012-10-14 LAB — COMPLETE METABOLIC PANEL WITH GFR
AST: 20 U/L (ref 0–37)
Albumin: 4.3 g/dL (ref 3.5–5.2)
Alkaline Phosphatase: 57 U/L (ref 39–117)
BUN: 14 mg/dL (ref 6–23)
Calcium: 9.8 mg/dL (ref 8.4–10.5)
Chloride: 108 mEq/L (ref 96–112)
Potassium: 4.3 mEq/L (ref 3.5–5.3)
Sodium: 141 mEq/L (ref 135–145)
Total Protein: 7.8 g/dL (ref 6.0–8.3)

## 2012-10-15 LAB — CBC WITH DIFFERENTIAL/PLATELET
Basophils Absolute: 0 10*3/uL (ref 0.0–0.1)
Eosinophils Relative: 2 % (ref 0–5)
Lymphocytes Relative: 27 % (ref 12–46)
Lymphs Abs: 2.3 10*3/uL (ref 0.7–4.0)
Neutrophils Relative %: 62 % (ref 43–77)
Platelets: 269 10*3/uL (ref 150–400)
RBC: 4.03 MIL/uL (ref 3.87–5.11)
RDW: 14.5 % (ref 11.5–15.5)
WBC: 8.8 10*3/uL (ref 4.0–10.5)

## 2012-10-15 LAB — COMPLETE METABOLIC PANEL WITH GFR

## 2012-10-15 LAB — HEMOGLOBIN A1C
Hgb A1c MFr Bld: 6.5 % — ABNORMAL HIGH (ref <5.7)
Mean Plasma Glucose: 140 mg/dL — ABNORMAL HIGH (ref <117)

## 2012-10-15 LAB — MICROALBUMIN / CREATININE URINE RATIO
Creatinine, Urine: 182.7 mg/dL
Microalb, Ur: 3.49 mg/dL — ABNORMAL HIGH (ref 0.00–1.89)

## 2012-10-17 ENCOUNTER — Ambulatory Visit (INDEPENDENT_AMBULATORY_CARE_PROVIDER_SITE_OTHER): Payer: Managed Care, Other (non HMO) | Admitting: Family Medicine

## 2012-10-17 ENCOUNTER — Encounter: Payer: Self-pay | Admitting: Family Medicine

## 2012-10-17 VITALS — BP 140/90 | HR 98 | Resp 16 | Ht 64.5 in | Wt 354.8 lb

## 2012-10-17 DIAGNOSIS — IMO0001 Reserved for inherently not codable concepts without codable children: Secondary | ICD-10-CM

## 2012-10-17 DIAGNOSIS — Z23 Encounter for immunization: Secondary | ICD-10-CM

## 2012-10-17 DIAGNOSIS — E785 Hyperlipidemia, unspecified: Secondary | ICD-10-CM

## 2012-10-17 DIAGNOSIS — F17200 Nicotine dependence, unspecified, uncomplicated: Secondary | ICD-10-CM

## 2012-10-17 DIAGNOSIS — E1129 Type 2 diabetes mellitus with other diabetic kidney complication: Secondary | ICD-10-CM

## 2012-10-17 DIAGNOSIS — E559 Vitamin D deficiency, unspecified: Secondary | ICD-10-CM

## 2012-10-17 DIAGNOSIS — E1065 Type 1 diabetes mellitus with hyperglycemia: Secondary | ICD-10-CM

## 2012-10-17 DIAGNOSIS — I10 Essential (primary) hypertension: Secondary | ICD-10-CM

## 2012-10-17 NOTE — Patient Instructions (Addendum)
F/u in 4 month, call if you need me before  CONGRATS on excellent blood sugar  Please change and structure your eating so that you lose 2 to 3 pounds per week, and commit to daily walking for 30 minutes total   Fasting lipid, cmp and EHGR, HBa1C, and vit D in 4 month  PLEASE SCHED mammogram and eye exam and keep both appts  asap  If you need to lower levemir dose as you eat better and exercise and lose weight this is OK, call if you need help  Blood pressure high due to 15 pound weight gain  PLs stop throwing away money and health, burning it up , we laugh together so we stop!  Flu vaccine today

## 2012-10-17 NOTE — Progress Notes (Signed)
  Subjective:    Patient ID: Isabella Matthews, female    DOB: 1970-10-17, 42 y.o.   MRN: [id number]  HPI The PT is here for follow up and re-evaluation of chronic medical conditions, medication management and review of any available recent lab and radiology data.  Preventive health is updated, specifically  Cancer screening and Immunization.   Questions or concerns regarding consultations or procedures which the PT has had in the interim are  addressed. The PT denies any adverse reactions to current medications since the last visit.  There are no new concerns.  There are no specific complaints       Review of Systems    See HPI Denies recent fever or chills. Denies sinus pressure, nasal congestion, ear pain or sore throat. Denies chest congestion, productive cough or wheezing. Denies chest pains, palpitations and leg swelling Denies abdominal pain, nausea, vomiting,diarrhea or constipation.   Denies dysuria, frequency, hesitancy or incontinence. Denies joint pain, swelling and limitation in mobility. Denies headaches, seizures, numbness, or tingling. Denies depression, anxiety or insomnia. Denies skin break down or rash.     Objective:   Physical Exam Patient alert and oriented and in no cardiopulmonary distress.  HEENT: No facial asymmetry, EOMI, no sinus tenderness,  oropharynx pink and moist.  Neck supple no adenopathy.  Chest: Clear to auscultation bilaterally.  CVS: S1, S2 no murmurs, no S3.  ABD: Soft non tender. Bowel sounds normal.  Ext: No edema  MS: Adequate ROM spine, shoulders, hips and knees.  Skin: Intact, no ulcerations or rash noted.  Psych: Good eye contact, normal affect. Memory intact not anxious or depressed appearing.  CNS: CN 2-12 intact, power, tone and sensation normal throughout.        Assessment & Plan:

## 2012-10-18 ENCOUNTER — Other Ambulatory Visit: Payer: Self-pay

## 2012-10-18 MED ORDER — SITAGLIPTIN PHOSPHATE 100 MG PO TABS: 100.0000 mg | ORAL_TABLET | Freq: Every day | ORAL | Status: DC

## 2012-10-19 NOTE — Assessment & Plan Note (Signed)
Marked improvement with the addition of farxiga Patient advised to reduce carb and sweets, commit to regular physical activity, take meds as prescribed, test blood as directed, and attempt to lose weight, to improve blood sugar control.

## 2012-10-19 NOTE — Assessment & Plan Note (Signed)
Uncontrolled, recent 15 pound weight gain, needs to work on this DASH diet and commitment to daily physical activity for a minimum of 30 minutes discussed and encouraged, as a part of hypertension management. The importance of attaining a healthy weight is also discussed. No med change

## 2012-10-19 NOTE — Assessment & Plan Note (Signed)
Deteriorated. Patient re-educated about  the importance of commitment to a  minimum of 150 minutes of exercise per week. The importance of healthy food choices with portion control discussed. Encouraged to start a food diary, count calories and to consider  joining a support group. Sample diet sheets offered. Goals set by the patient for the next several months.    

## 2012-10-19 NOTE — Assessment & Plan Note (Signed)
Controlled, no change in medication Hyperlipidemia:Low fat diet discussed and encouraged.  \ 

## 2012-10-19 NOTE — Assessment & Plan Note (Signed)
Unchanged and unwilling to set quit date, smokes very little actually abou 3 ciggs/day Patient counseled for approximately 5 minutes regarding the health risks of ongoing nicotine use, specifically all types of cancer, heart disease, stroke and respiratory failure. The options available for help with cessation ,the behavioral changes to assist the process, and the option to either gradully reduce usage  Or abruptly stop.is also discussed. Pt is also encouraged to set specific goals in number of cigarettes used daily, as well as to set a quit date.

## 2012-11-25 ENCOUNTER — Ambulatory Visit (INDEPENDENT_AMBULATORY_CARE_PROVIDER_SITE_OTHER): Payer: Managed Care, Other (non HMO) | Admitting: Family Medicine

## 2012-11-25 ENCOUNTER — Ambulatory Visit (HOSPITAL_COMMUNITY)
Admission: RE | Admit: 2012-11-25 | Discharge: 2012-11-25 | Disposition: A | Discharge status: Home / Self Care | Payer: Managed Care, Other (non HMO) | Source: Ambulatory Visit | Attending: Family Medicine | Admitting: Family Medicine

## 2012-11-25 ENCOUNTER — Encounter: Payer: Self-pay | Admitting: Family Medicine

## 2012-11-25 ENCOUNTER — Telehealth: Payer: Self-pay

## 2012-11-25 VITALS — BP 138/96 | HR 98 | Temp 98.7°F | Resp 18 | Ht 64.5 in | Wt 358.0 lb

## 2012-11-25 DIAGNOSIS — B9689 Other specified bacterial agents as the cause of diseases classified elsewhere: Secondary | ICD-10-CM

## 2012-11-25 DIAGNOSIS — I10 Essential (primary) hypertension: Secondary | ICD-10-CM | POA: Insufficient documentation

## 2012-11-25 DIAGNOSIS — Z87891 Personal history of nicotine dependence: Secondary | ICD-10-CM | POA: Insufficient documentation

## 2012-11-25 DIAGNOSIS — E1129 Type 2 diabetes mellitus with other diabetic kidney complication: Secondary | ICD-10-CM

## 2012-11-25 DIAGNOSIS — N058 Unspecified nephritic syndrome with other morphologic changes: Secondary | ICD-10-CM

## 2012-11-25 DIAGNOSIS — J209 Acute bronchitis, unspecified: Secondary | ICD-10-CM

## 2012-11-25 DIAGNOSIS — A499 Bacterial infection, unspecified: Secondary | ICD-10-CM

## 2012-11-25 DIAGNOSIS — E1121 Type 2 diabetes mellitus with diabetic nephropathy: Secondary | ICD-10-CM

## 2012-11-25 DIAGNOSIS — R0989 Other specified symptoms and signs involving the circulatory and respiratory systems: Secondary | ICD-10-CM | POA: Insufficient documentation

## 2012-11-25 DIAGNOSIS — E119 Type 2 diabetes mellitus without complications: Secondary | ICD-10-CM | POA: Insufficient documentation

## 2012-11-25 DIAGNOSIS — J019 Acute sinusitis, unspecified: Secondary | ICD-10-CM | POA: Insufficient documentation

## 2012-11-25 LAB — CBC WITH DIFFERENTIAL/PLATELET
HCT: 39.7 % (ref 36.0–46.0)
Hemoglobin: 12.3 g/dL (ref 12.0–15.0)
Lymphocytes Relative: 36 % (ref 12–46)
Monocytes Absolute: 0.5 10*3/uL (ref 0.1–1.0)
Monocytes Relative: 11 % (ref 3–12)
Neutro Abs: 1.9 10*3/uL (ref 1.7–7.7)
RBC: 4.46 MIL/uL (ref 3.87–5.11)
WBC: 4.2 10*3/uL (ref 4.0–10.5)

## 2012-11-25 MED ORDER — HYDROCHLOROTHIAZIDE 25 MG PO TABS: 25.0000 mg | ORAL_TABLET | Freq: Every day | ORAL | Status: DC

## 2012-11-25 MED ORDER — BENZONATATE 100 MG PO CAPS: 100.0000 mg | ORAL_CAPSULE | Freq: Four times a day (QID) | ORAL | Status: DC | PRN

## 2012-11-25 MED ORDER — CEFTRIAXONE SODIUM 500 MG IJ SOLR
500.0000 mg | Freq: Once | INTRAMUSCULAR | Status: AC
  Administered 2012-11-25: 500 mg via INTRAMUSCULAR

## 2012-11-25 MED ORDER — MOXIFLOXACIN HCL 400 MG PO TABS: 400.0000 mg | ORAL_TABLET | Freq: Every day | ORAL | Status: AC

## 2012-11-25 NOTE — Patient Instructions (Addendum)
F/u as before  You are treated for acute sinusitis and bronchitis.  CXR today and cbc and diff stat, you will be contactted with result  Rocephin 500mg  IM in the office, antibiotic and decongestants are prescribed.  Blood pressure is high, additional medication HCTZ 25mg  one daily to be sent to pharmacy of your choice.  Work excuse from today to return on Wednesday

## 2012-11-25 NOTE — Assessment & Plan Note (Signed)
Rocephin 500mg  im in the office , followed by avelox and tessalon perles Work excuse from today to Wednesda

## 2012-11-25 NOTE — Progress Notes (Signed)
  Subjective:    Patient ID: Isabella Matthews, female    DOB: 1970-07-10, 42 y.o.   MRN: [id number]  HPI 6  day h/o progressive head dn chest congestion with yellow green nasal drainage and cough productive of green sputum, geberalized body  Aches, fever and chills   Review of Systems See HPI Denies recent fever or chills. c/o sinus pressure, nasal congestion, denies  ear pain does c/o  sore throat. Chest pain with deep breathing and excessive cough Denies  palpitations and leg swelling Denies abdominal pain, nausea, vomiting,diarrhea or constipation.   Denies dysuria, frequency, hesitancy or incontinence. Denies joint pain, swelling and limitation in mobility. Denies headaches, seizures, numbness, or tingling. Denies depression, anxiety or insomnia. Denies skin break down or rash.        Objective:   Physical Exam  Patient alert and oriented and in no cardiopulmonary distress.Ill appearing  HEENT: No facial asymmetry, EOMI, maxillary  sinus tenderness,  oropharynx pink and moist No exudate.  Neck supple bilateral anterior cervical adenopathy.TM clear  Chest: decreased though adequate air entry, no wheezes, bilateral crackles  CVS: S1, S2 no murmurs, no S3.  ABD: Soft non tender. Bowel sounds normal.  Ext: No edema  MS: Adequate ROM spine, shoulders, hips and knees.  Skin: Intact, no ulcerations or rash noted.  Psych: Good eye contact, normal affect. Memory intact not anxious or depressed appearing.  CNS: CN 2-12 intact, power, tone and sensation normal throughout.       Assessment & Plan:

## 2012-11-25 NOTE — Telephone Encounter (Signed)
Evaluated in office today

## 2012-11-25 NOTE — Telephone Encounter (Signed)
Patient worked in today.  Cancelled appt.

## 2012-11-27 ENCOUNTER — Ambulatory Visit: Payer: Managed Care, Other (non HMO) | Admitting: Family Medicine

## 2012-12-01 NOTE — Assessment & Plan Note (Signed)
Antibiotics and decongestants and 2 day work excuse

## 2012-12-01 NOTE — Assessment & Plan Note (Signed)
Improved. Pt applauded on succesful weight loss through lifestyle change, and encouraged to continue same. Weight loss goal set for the next several months.  

## 2012-12-01 NOTE — Assessment & Plan Note (Signed)
Uncontrolled,ad hCTZ DASH diet and commitment to daily physical activity for a minimum of 30 minutes discussed and encouraged, as a part of hypertension management. The importance of attaining a healthy weight is also discussed.

## 2012-12-01 NOTE — Assessment & Plan Note (Signed)
Marked improvement Patient advised to reduce carb and sweets, commit to regular physical activity, take meds as prescribed, test blood as directed, and attempt to lose weight, to improve blood sugar control.

## 2012-12-10 ENCOUNTER — Other Ambulatory Visit: Payer: Self-pay | Admitting: Family Medicine

## 2012-12-10 DIAGNOSIS — Z139 Encounter for screening, unspecified: Secondary | ICD-10-CM

## 2012-12-12 ENCOUNTER — Ambulatory Visit (HOSPITAL_COMMUNITY)
Admission: RE | Admit: 2012-12-12 | Discharge: 2012-12-12 | Disposition: A | Discharge status: Home / Self Care | Payer: Managed Care, Other (non HMO) | Source: Ambulatory Visit | Attending: Family Medicine | Admitting: Family Medicine

## 2012-12-12 DIAGNOSIS — Z139 Encounter for screening, unspecified: Secondary | ICD-10-CM

## 2012-12-12 DIAGNOSIS — Z1231 Encounter for screening mammogram for malignant neoplasm of breast: Secondary | ICD-10-CM | POA: Insufficient documentation

## 2013-01-16 ENCOUNTER — Emergency Department (HOSPITAL_COMMUNITY)
Admission: EM | Admit: 2013-01-16 | Discharge: 2013-01-16 | Disposition: A | Discharge status: Home / Self Care | Payer: Managed Care, Other (non HMO) | Attending: Emergency Medicine | Admitting: Emergency Medicine

## 2013-01-16 ENCOUNTER — Encounter (HOSPITAL_COMMUNITY): Payer: Self-pay | Admitting: Emergency Medicine

## 2013-01-16 ENCOUNTER — Emergency Department (HOSPITAL_COMMUNITY): Payer: Managed Care, Other (non HMO)

## 2013-01-16 DIAGNOSIS — I10 Essential (primary) hypertension: Secondary | ICD-10-CM | POA: Insufficient documentation

## 2013-01-16 DIAGNOSIS — Z791 Long term (current) use of non-steroidal anti-inflammatories (NSAID): Secondary | ICD-10-CM | POA: Insufficient documentation

## 2013-01-16 DIAGNOSIS — E119 Type 2 diabetes mellitus without complications: Secondary | ICD-10-CM | POA: Insufficient documentation

## 2013-01-16 DIAGNOSIS — S8000XA Contusion of unspecified knee, initial encounter: Secondary | ICD-10-CM | POA: Insufficient documentation

## 2013-01-16 DIAGNOSIS — Z79899 Other long term (current) drug therapy: Secondary | ICD-10-CM | POA: Insufficient documentation

## 2013-01-16 DIAGNOSIS — Z794 Long term (current) use of insulin: Secondary | ICD-10-CM | POA: Insufficient documentation

## 2013-01-16 DIAGNOSIS — Y939 Activity, unspecified: Secondary | ICD-10-CM | POA: Insufficient documentation

## 2013-01-16 DIAGNOSIS — R296 Repeated falls: Secondary | ICD-10-CM | POA: Insufficient documentation

## 2013-01-16 DIAGNOSIS — R269 Unspecified abnormalities of gait and mobility: Secondary | ICD-10-CM | POA: Insufficient documentation

## 2013-01-16 DIAGNOSIS — F17200 Nicotine dependence, unspecified, uncomplicated: Secondary | ICD-10-CM | POA: Insufficient documentation

## 2013-01-16 DIAGNOSIS — Z8619 Personal history of other infectious and parasitic diseases: Secondary | ICD-10-CM | POA: Insufficient documentation

## 2013-01-16 DIAGNOSIS — S8001XA Contusion of right knee, initial encounter: Secondary | ICD-10-CM

## 2013-01-16 DIAGNOSIS — Y929 Unspecified place or not applicable: Secondary | ICD-10-CM | POA: Insufficient documentation

## 2013-01-16 MED ORDER — NAPROXEN 500 MG PO TABS: 500.0000 mg | ORAL_TABLET | Freq: Two times a day (BID) | ORAL | Status: DC

## 2013-01-16 MED ORDER — NAPROXEN 250 MG PO TABS
500.0000 mg | ORAL_TABLET | Freq: Once | ORAL | Status: AC
Start: 2013-01-16 — End: 2013-01-16
  Administered 2013-01-16: 500 mg via ORAL
  Filled 2013-01-16: qty 2

## 2013-01-16 MED ORDER — TRAMADOL HCL 50 MG PO TABS: 50.0000 mg | ORAL_TABLET | Freq: Four times a day (QID) | ORAL | Status: DC | PRN

## 2013-01-16 NOTE — ED Provider Notes (Signed)
Care transferred to me. No obvious fracture on the x-ray. I discussed this with the patient, discussed using the knee immobilizer and crutches, RICE protocol and follow up with her PCP.   Audree Camel, MD 01/16/13 [phone]

## 2013-01-16 NOTE — ED Provider Notes (Signed)
CSN: [id number]     Arrival date & time 01/16/13  1308 History   First MD Initiated Contact with Patient 01/16/13 [phone]     Chief Complaint  Patient presents with  . Knee Pain   (Consider location/radiation/quality/duration/timing/severity/associated sxs/prior Treatment) HPI Comments: 42 year old female who presents after falling and striking her right knee last night. She states that she lost her balance and fell forward onto her knees, the right knee had more pain in the left knee. She was able to get up and ambulate after that but with significant pain and with a slight abnormal gait. She was able to get to the hospital tonight walking to and from her car but states that she had to limp because of pain in the right knee. She describes the pain is anterior, inferior to the patella, not associated with numbness or weakness but does have some swelling around the right knee.  Patient is a 42 y.o. female presenting with knee pain. The history is provided by the patient.  Knee Pain Associated symptoms: no back pain and no neck pain     Past Medical History  Diagnosis Date  . Edema   . Essential hypertension   . Onychomycosis of toenail   . Diabetes mellitus   . Aortic valve disorders   . Aortic stenosis, mild   . Morbid obesity    Past Surgical History  Procedure Laterality Date  . Cesarean section      x2   Family History  Problem Relation Age of Onset  . Hypertension Mother   . Hypertension Father   . Diabetes Father    History  Substance Use Topics  . Smoking status: Current Some Day Smoker -- 0.50 packs/day    Types: Cigarettes  . Smokeless tobacco: Not on file  . Alcohol Use: Yes   OB History   Grav Para Term Preterm Abortions TAB SAB Ect Mult Living                 Review of Systems  Gastrointestinal: Negative for nausea and vomiting.  Musculoskeletal: Positive for joint swelling (Right knee). Negative for back pain and neck pain.  Neurological: Negative for  weakness and numbness.    Allergies  Ace inhibitors and Metformin and related  Home Medications   Current Outpatient Rx  Name  Route  Sig  Dispense  Refill  . benzonatate (TESSALON PERLES) 100 MG capsule   Oral   Take 1 capsule (100 mg total) by mouth every 6 (six) hours as needed for cough.   30 capsule   0   . Dapagliflozin Propanediol (FARXIGA) 10 MG TABS   Oral   Take 1 tablet by mouth daily.   30 tablet   11   . ferrous sulfate (SLOW RELEASE IRON) 160 (50 FE) MG TBCR   Oral   Take by mouth daily.           Marland Kitchen glimepiride (AMARYL) 4 MG tablet      TAKE ONE TABLET BY MOUTH TWICE DAILY   180 tablet   1   . glucose blood (ONE TOUCH ULTRA TEST) test strip      USE STRIPS FOR ONCE DAILY TESTING   300 each   2   . Glucose Blood DISK                . hydrochlorothiazide (HYDRODIURIL) 25 MG tablet   Oral   Take 1 tablet (25 mg total) by mouth daily.   90 tablet  1   . Insulin Detemir (LEVEMIR FLEXPEN) 100 UNIT/ML SOPN      45 units daily - please dispense 90 day supply   15 pen   1   . Insulin Pen Needle (B-D ULTRAFINE III SHORT PEN) 31G X 8 MM MISC      Use with insulin daily  DX code 250.00   90 each   11   . Lancets MISC   Does not apply   by Does not apply route.           Marland Kitchen losartan (COZAAR) 50 MG tablet   Oral   Take 1 tablet (50 mg total) by mouth daily.   90 tablet   3     Discontinue lisinopriil effective 07/12/2012   . lovastatin (MEVACOR) 40 MG tablet      One tab daily   90 tablet   2   . naproxen (NAPROSYN) 500 MG tablet   Oral   Take 1 tablet (500 mg total) by mouth 2 (two) times daily with a meal.   30 tablet   0   . sitaGLIPtin (JANUVIA) 100 MG tablet   Oral   Take 1 tablet (100 mg total) by mouth daily.   90 tablet   1   . traMADol (ULTRAM) 50 MG tablet   Oral   Take 1 tablet (50 mg total) by mouth every 6 (six) hours as needed.   15 tablet   0    BP 152/78  Pulse 108  Temp(Src) 98.2 F (36.8 C)  (Oral)  Resp 20  Ht 5\' 1"  (1.549 m)  Wt 367 lb (166.47 kg)  BMI 69.38 kg/m2  SpO2 95%  LMP 12/15/2012 Physical Exam  Nursing note and vitals reviewed. Constitutional: She appears well-developed and well-nourished. No distress.  HENT:  Head: Normocephalic and atraumatic.  Eyes: Conjunctivae are normal. No scleral icterus.  Cardiovascular: Normal rate, regular rhythm and intact distal pulses.   Pulmonary/Chest: Effort normal and breath sounds normal.  Musculoskeletal: She exhibits tenderness ( Mild swelling to the right knee, slight decreased range of motion secondary to pain, tenderness inferior to the patella, able to straight leg raise against resistance.). She exhibits no edema.  Neurological: She is alert.  Skin: Skin is warm and dry. No rash noted. She is not diaphoretic.    ED Course  Procedures (including critical care time) Labs Review Labs Reviewed - No data to display Imaging Review No results found.  EKG Interpretation   None       MDM   1. 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  Change of shift - care signed out to Dr. Criss Alvine pending xray read.  Meds given in ED:  Medications  naproxen (NAPROSYN) tablet 500 mg (500 mg Oral Given 01/16/13 0650)    New Prescriptions   NAPROXEN (NAPROSYN) 500 MG TABLET    Take 1 tablet (500 mg total) by mouth 2 (two) times daily with a meal.   TRAMADOL (ULTRAM) 50 MG TABLET    Take 1 tablet (50 mg total) by mouth every 6 (six) hours as needed.      Vida Roller, MD 01/16/13 [phone]

## 2013-01-16 NOTE — ED Notes (Signed)
Patient reports fell on right knee last night. Complaining of pain and swelling to right knee.

## 2013-02-19 LAB — COMPLETE METABOLIC PANEL WITH GFR
ALT: 40 U/L — ABNORMAL HIGH (ref 0–35)
AST: 29 U/L (ref 0–37)
Albumin: 3.8 g/dL (ref 3.5–5.2)
Alkaline Phosphatase: 78 U/L (ref 39–117)
BUN: 14 mg/dL (ref 6–23)
CO2: 27 mEq/L (ref 19–32)
Calcium: 9.6 mg/dL (ref 8.4–10.5)
Chloride: 104 mEq/L (ref 96–112)
Creat: 0.73 mg/dL (ref 0.50–1.10)
GFR, Est African American: >89 mL/min
GFR, Est Non African American: >89 mL/min
Glucose, Bld: 174 mg/dL — ABNORMAL HIGH (ref 70–99)
Potassium: 5 mEq/L (ref 3.5–5.3)
Sodium: 141 mEq/L (ref 135–145)
Total Bilirubin: 0.5 mg/dL (ref 0.3–1.2)
Total Protein: 7.3 g/dL (ref 6.0–8.3)

## 2013-02-19 LAB — LIPID PANEL
Cholesterol: 171 mg/dL (ref 0–200)
HDL: 58 mg/dL (ref >39)
LDL Cholesterol: 93 mg/dL (ref 0–99)
Total CHOL/HDL Ratio: 2.9 Ratio
Triglycerides: 100 mg/dL (ref <150)
VLDL: 20 mg/dL (ref 0–40)

## 2013-02-19 LAB — HEMOGLOBIN A1C
Hgb A1c MFr Bld: 7.5 % — ABNORMAL HIGH (ref <5.7)
Mean Plasma Glucose: 169 mg/dL — ABNORMAL HIGH (ref <117)

## 2013-02-19 LAB — VITAMIN D 25 HYDROXY (VIT D DEFICIENCY, FRACTURES): Vit D, 25-Hydroxy: 10 ng/mL — ABNORMAL LOW (ref 30–89)

## 2013-02-20 ENCOUNTER — Encounter: Payer: Self-pay | Admitting: Family Medicine

## 2013-02-20 ENCOUNTER — Ambulatory Visit (INDEPENDENT_AMBULATORY_CARE_PROVIDER_SITE_OTHER): Payer: Managed Care, Other (non HMO) | Admitting: Family Medicine

## 2013-02-20 VITALS — BP 122/84 | HR 86 | Resp 18 | Ht 64.5 in | Wt 345.0 lb

## 2013-02-20 DIAGNOSIS — E785 Hyperlipidemia, unspecified: Secondary | ICD-10-CM

## 2013-02-20 DIAGNOSIS — J019 Acute sinusitis, unspecified: Secondary | ICD-10-CM

## 2013-02-20 DIAGNOSIS — B377 Candidal sepsis: Secondary | ICD-10-CM

## 2013-02-20 DIAGNOSIS — L0291 Cutaneous abscess, unspecified: Secondary | ICD-10-CM

## 2013-02-20 DIAGNOSIS — L0390 Cellulitis, unspecified: Secondary | ICD-10-CM | POA: Insufficient documentation

## 2013-02-20 DIAGNOSIS — J040 Acute laryngitis: Secondary | ICD-10-CM

## 2013-02-20 DIAGNOSIS — E1129 Type 2 diabetes mellitus with other diabetic kidney complication: Secondary | ICD-10-CM

## 2013-02-20 DIAGNOSIS — E1121 Type 2 diabetes mellitus with diabetic nephropathy: Secondary | ICD-10-CM

## 2013-02-20 DIAGNOSIS — N058 Unspecified nephritic syndrome with other morphologic changes: Secondary | ICD-10-CM

## 2013-02-20 DIAGNOSIS — F17200 Nicotine dependence, unspecified, uncomplicated: Secondary | ICD-10-CM

## 2013-02-20 DIAGNOSIS — I10 Essential (primary) hypertension: Secondary | ICD-10-CM

## 2013-02-20 MED ORDER — FLUCONAZOLE 100 MG PO TABS: 100.0000 mg | ORAL_TABLET | Freq: Every day | ORAL | Status: DC

## 2013-02-20 MED ORDER — CEFTRIAXONE SODIUM 1 G IJ SOLR
500.0000 mg | Freq: Once | INTRAMUSCULAR | Status: AC
  Administered 2013-02-20: 500 mg via INTRAMUSCULAR

## 2013-02-20 MED ORDER — NYSTATIN 100000 UNIT/GM EX POWD: Freq: Three times a day (TID) | CUTANEOUS | Status: DC

## 2013-02-20 MED ORDER — DOXYCYCLINE HYCLATE 100 MG PO TABS: 100.0000 mg | ORAL_TABLET | Freq: Two times a day (BID) | ORAL | Status: DC

## 2013-02-20 MED ORDER — NYSTATIN 100000 UNIT/GM EX POWD: CUTANEOUS | Status: DC

## 2013-02-20 MED ORDER — FLUCONAZOLE 100 MG PO TABS: ORAL_TABLET | ORAL | Status: DC

## 2013-02-20 NOTE — Patient Instructions (Addendum)
F/u in 3.5   weeks, call if worsening   Rocephin in office , and 2 tablets and powder sent to local pharmacy  Work excuse from today return 02/24/2013  Seriously consider gastric bypass, and contact your insurance company about this if you feel you want this. You do need to work on weight loss,   STOP LOVASTATIN for cholesterol while taking the tablets for the fungal skin infection. Do Not start lovastain till end of February  Labs are good overall  OK to apply clean dressing loosesly over open areas of skin.

## 2013-02-20 NOTE — Assessment & Plan Note (Addendum)
Rocephin 500mg  IM Severe fungal skin infection with skin breakdown, in lower abdominal fold extending down inner thighs, asn well as upper abdominal fold and under the breasts. For bacterial super infection, Rocephin administered and doxy prescribed

## 2013-02-23 DIAGNOSIS — J040 Acute laryngitis: Secondary | ICD-10-CM | POA: Insufficient documentation

## 2013-02-23 NOTE — Assessment & Plan Note (Signed)
Controlled, no change in medication DASH diet and commitment to daily physical activity for a minimum of 30 minutes discussed and encouraged, as a part of hypertension management. The importance of attaining a healthy weight is also discussed.  

## 2013-02-23 NOTE — Assessment & Plan Note (Signed)
Voice rest , salt water gargles, and antibiotic course for sinus infection

## 2013-02-23 NOTE — Assessment & Plan Note (Signed)
Deteriorated. Patient re-educated about  the importance of commitment to a  minimum of 150 minutes of exercise per week. The importance of healthy food choices with portion control discussed. Encouraged to start a food diary, count calories and to consider  joining a support group. Sample diet sheets offered. Goals set by the patient for the next several months.   Pt encouraged to seriously consider gastric bypass, states she was interested at one time and will re consider

## 2013-02-23 NOTE — Progress Notes (Signed)
   Subjective:    Patient ID: Isabella Matthews, female    DOB: 12-14-1970, 43 y.o.   MRN: [id number]  HPI The PT is here for follow up and re-evaluation of chronic medical conditions, medication management and review of any available recent lab and radiology data.  Preventive health is updated, specifically  Cancer screening and Immunization.    The PT denies any adverse reactions to current medications since the last visit.  3 week h/o progressive skin breakdown with rash , itching and drainage, denies fever or chills  1 week h/o progressive loss of voice and yellow nasal and post nasal drainage, also has cough productive of yellow . Blood sugar has increased in the past 4 weeks as her rash worsened, denies polyuria, polydipsia or blurred vision   Review of Systems See HPI Denies recent fever or chills. Denies chest pains, palpitations and leg swelling Denies abdominal pain, nausea, vomiting,diarrhea or constipation.   Denies dysuria, frequency, hesitancy or incontinence. Denies joint pain, swelling and limitation in mobility. Denies headaches, seizures, numbness, or tingling. Denies depression, anxiety or insomnia.       Objective:   Physical Exam Patient alert and oriented and in no cardiopulmonary distress.  HEENT: No facial asymmetry, EOMI, maxillary  sinus tenderness,  oropharynx pink and moist.  Neck supple no adenopathy.TM clear, voice is hoarse , she has laryngitis  Chest: Clear to auscultation bilaterally.  CVS: S1, S2 no murmurs, no S3.  ABD: Soft non tender. Bowel sounds normal.  Ext: No edema  MS:  Though reduced ROM spine, shoulders, hips and knees.  Skin:severe candidiasis affecting lower abdominal fold and both inner  Upper thighs and groin, mid abdominal skin folds and inframammary olds. Skin breakdown in areas with drainage which is clear.  Psych: Good eye contact, normal affect. Memory intact not anxious or depressed appearing.  CNS: CN 2-12 intact,  power, tone and sensation normal throughout.        Assessment & Plan:

## 2013-02-23 NOTE — Assessment & Plan Note (Signed)
Controlled, no change in medication Hyperlipidemia:Low fat diet discussed and encouraged.  \ 

## 2013-02-23 NOTE — Assessment & Plan Note (Signed)
Doxycycline prescribed and patient to use saline nasal flushes also

## 2013-02-23 NOTE — Assessment & Plan Note (Signed)
Deteriorated, likely due to extent of skin infection that pt presents with. No med changes at this time Patient advised to reduce carb and sweets, commit to regular physical activity, take meds as prescribed, test blood as directed, and attempt to lose weight, to improve blood sugar control.

## 2013-02-23 NOTE — Assessment & Plan Note (Signed)
Unchanged. Patient counseled for approximately 5 minutes regarding the health risks of ongoing nicotine use, specifically all types of cancer, heart disease, stroke and respiratory failure. The options available for help with cessation ,the behavioral changes to assist the process, and the option to either gradully reduce usage  Or abruptly stop.is also discussed. Pt is also encouraged to set specific goals in number of cigarettes used daily, as well as to set a quit date.  

## 2013-02-23 NOTE — Assessment & Plan Note (Addendum)
Sever fungal skin infection affecting lower abdomen, upper inner thighs, infra mammary areas and also mid abdominal fold. Oral antifungal for several weeks and topical nystatin Work exceuse for several days due to the extent of the skin involvement

## 2013-02-24 ENCOUNTER — Telehealth: Payer: Self-pay | Admitting: Family Medicine

## 2013-02-24 ENCOUNTER — Other Ambulatory Visit: Payer: Self-pay

## 2013-02-24 DIAGNOSIS — B377 Candidal sepsis: Secondary | ICD-10-CM

## 2013-02-24 MED ORDER — NYSTATIN 100000 UNIT/GM EX POWD: Freq: Three times a day (TID) | CUTANEOUS | Status: DC

## 2013-02-24 NOTE — Telephone Encounter (Signed)
Med resent to pharmacy

## 2013-03-19 ENCOUNTER — Ambulatory Visit: Payer: Managed Care, Other (non HMO) | Admitting: Family Medicine

## 2013-03-25 ENCOUNTER — Encounter: Payer: Self-pay | Admitting: Family Medicine

## 2013-03-25 ENCOUNTER — Other Ambulatory Visit: Payer: Self-pay

## 2013-03-25 ENCOUNTER — Ambulatory Visit (INDEPENDENT_AMBULATORY_CARE_PROVIDER_SITE_OTHER): Payer: Managed Care, Other (non HMO) | Admitting: Family Medicine

## 2013-03-25 VITALS — BP 140/90 | HR 87 | Temp 98.4°F | Resp 16 | Wt 339.0 lb

## 2013-03-25 DIAGNOSIS — I10 Essential (primary) hypertension: Secondary | ICD-10-CM

## 2013-03-25 DIAGNOSIS — E785 Hyperlipidemia, unspecified: Secondary | ICD-10-CM

## 2013-03-25 DIAGNOSIS — E1121 Type 2 diabetes mellitus with diabetic nephropathy: Secondary | ICD-10-CM

## 2013-03-25 DIAGNOSIS — F17200 Nicotine dependence, unspecified, uncomplicated: Secondary | ICD-10-CM

## 2013-03-25 DIAGNOSIS — B369 Superficial mycosis, unspecified: Secondary | ICD-10-CM

## 2013-03-25 DIAGNOSIS — J040 Acute laryngitis: Secondary | ICD-10-CM | POA: Insufficient documentation

## 2013-03-25 DIAGNOSIS — B377 Candidal sepsis: Secondary | ICD-10-CM

## 2013-03-25 DIAGNOSIS — K219 Gastro-esophageal reflux disease without esophagitis: Secondary | ICD-10-CM

## 2013-03-25 DIAGNOSIS — J309 Allergic rhinitis, unspecified: Secondary | ICD-10-CM

## 2013-03-25 DIAGNOSIS — N058 Unspecified nephritic syndrome with other morphologic changes: Secondary | ICD-10-CM

## 2013-03-25 DIAGNOSIS — E1129 Type 2 diabetes mellitus with other diabetic kidney complication: Secondary | ICD-10-CM

## 2013-03-25 MED ORDER — LANSOPRAZOLE 30 MG PO CPDR: 30.0000 mg | DELAYED_RELEASE_CAPSULE | Freq: Every day | ORAL | Status: DC

## 2013-03-25 MED ORDER — LOVASTATIN 40 MG PO TABS: ORAL_TABLET | ORAL | Status: DC

## 2013-03-25 MED ORDER — GLIMEPIRIDE 4 MG PO TABS
ORAL_TABLET | ORAL | Status: DC
Start: 2013-03-25 — End: 2013-03-25

## 2013-03-25 MED ORDER — GLIMEPIRIDE 4 MG PO TABS: ORAL_TABLET | ORAL | Status: DC

## 2013-03-25 MED ORDER — FLUCONAZOLE 100 MG PO TABS: ORAL_TABLET | ORAL | Status: DC

## 2013-03-25 MED ORDER — LANCETS MISC: Status: DC

## 2013-03-25 MED ORDER — FLUTICASONE PROPIONATE 50 MCG/ACT NA SUSP: 2.0000 | Freq: Every day | NASAL | Status: DC

## 2013-03-25 NOTE — Patient Instructions (Addendum)
F/u early may, call if tyou need me before  New medication for reflux, yeast infection sent to [facility]  NO MORE cigarettes after today as we discussed please  You are referred to Dr Benjamine Mola re chronic loss of voice  Work on weight loss  HBA1C, fasting lipid, cmp and EGFR 3 to 5 days before follow up

## 2013-04-17 ENCOUNTER — Ambulatory Visit (INDEPENDENT_AMBULATORY_CARE_PROVIDER_SITE_OTHER): Payer: Managed Care, Other (non HMO) | Admitting: Otolaryngology

## 2013-04-17 DIAGNOSIS — K219 Gastro-esophageal reflux disease without esophagitis: Secondary | ICD-10-CM

## 2013-04-17 DIAGNOSIS — R490 Dysphonia: Secondary | ICD-10-CM

## 2013-05-10 NOTE — Assessment & Plan Note (Signed)
Chronic painless hoarseness which is worsening , also has ongoing nicotine use, referred again to ENT for eval, smoking cessation strongly encouraged

## 2013-05-10 NOTE — Assessment & Plan Note (Addendum)
UnControlled, no change in medication, pt currently out of one of her meds, the importance of compliance is stressed DASH diet and commitment to daily physical activity for a minimum of 30 minutes discussed and encouraged, as a part of hypertension management. The importance of attaining a healthy weight is also discussed.

## 2013-05-10 NOTE — Assessment & Plan Note (Signed)
Resolved, but remains at high risk for recurrence due to morbid obesity with multiple skin folds, will keep areas as dry as possible with cornstarch and use nystatin powder as needed

## 2013-05-10 NOTE — Assessment & Plan Note (Signed)
Uncontrolled with increased symptoms due to season change, pt to use flonase  daily

## 2013-05-10 NOTE — Assessment & Plan Note (Addendum)
Uncontrolled Patient advised to reduce carb and sweets, commit to regular physical activity, take meds as prescribed, test blood as directed, and attempt to lose weight, to improve blood sugar control. Updated lab needed at/ before next visit. Still needs eye exam, she is aware, finances are the stated constraint currently Increased yeast vaginitis with farxiga, fluconazole sent in for as needed use, she is encouraged strongly to reduce carb intake also

## 2013-05-10 NOTE — Assessment & Plan Note (Signed)
Uncontrolled, pt to reduce/ stop both nicotine and caffeine, and start PPI

## 2013-05-10 NOTE — Assessment & Plan Note (Signed)
Unchanged. Patient counseled for approximately 5 minutes regarding the health risks of ongoing nicotine use, specifically all types of cancer, heart disease, stroke and respiratory failure. The options available for help with cessation ,the behavioral changes to assist the process, and the option to either gradully reduce usage  Or abruptly stop.is also discussed. Pt is also encouraged to set specific goals in number of cigarettes used daily, as well as to set a quit date.  

## 2013-05-10 NOTE — Assessment & Plan Note (Addendum)
Improved. Pt applauded on succesful weight loss through lifestyle change, and encouraged to continue same. Weight loss goal set for the next several months. Pt seriously encouraged to consider  bariatric surgery

## 2013-05-10 NOTE — Progress Notes (Signed)
Subjective:    Patient ID: Isabella Matthews, female    DOB: 31-Jul-1970, 43 y.o.   MRN: [id number]  HPI The PT is here for follow up and re-evaluation of chronic medical conditions, medication management and review of any available recent lab and radiology data.  Preventive health is updated, specifically  Cancer screening and Immunization. Still needs eye exam and is aware   The PT denies any adverse reactions to current medications since the last visit.  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 Reflux symptoms are uncontrolled and like;ly also contributing greatly to her hoarseness, will again refer to ENT (missed previous appt) Also encouraged strongly to consider bariatric surgery. C/o increased vaginal itch, polyuria and better blood sugar control on current regime . Denies hypoglycemic episodes, states fasting sugars are seldom over 145      Review of Systems See HPI Denies recent fever or chills. Denies sinus pressure, nasal congestion, ear pain or sore throat. Denies chest congestion, productive cough or wheezing. Denies chest pains, palpitations and leg swelling Denies abdominal pain, nausea, vomiting,diarrhea or constipation.   Denies dysuria, frequency, hesitancy or incontinence. Chronic  limitation in mobility due morbid obesity Denies headaches, seizures, numbness, or tingling. Denies depression, anxiety or insomnia. Denies skin break down or rash.        Objective:   Physical Exam  BP 140/90  Pulse 87  Temp(Src) 98.4 F (36.9 C) (Oral)  Resp 16  Wt 339 lb (153.769 kg)  SpO2 98% Patient alert and oriented and in no cardiopulmonary distress.  HEENT: No facial asymmetry, EOMI, no sinus tenderness,  oropharynx pink and moist.   Neck supple no adenopathy.  Chest: Clear to auscultation bilaterally.  CVS: S1, S2 no murmurs, no S3.  ABD: Soft non tender. Bowel sounds normal.  Ext: No edema  MS: Adequate though reduced  ROM spine, shoulders, hips and knees.  Skin: Intact, no ulcerations or rash noted.  Psych: Good eye contact, normal affect. Memory intact not anxious or depressed appearing.  CNS: CN 2-12 intact, power, tone and sensation normal throughout.       Assessment & Plan:  Nicotine dependence Unchanged Patient counseled for approximately 5 minutes regarding the health risks of ongoing nicotine use, specifically all types of cancer, heart disease, stroke and respiratory failure. The options available for help with cessation ,the behavioral changes to assist the process, and the option to either gradully reduce usage  Or abruptly stop.is also discussed. Pt is also encouraged to set specific goals in number of cigarettes used daily, as well as to set a quit date.   MORBID OBESITY Improved. Pt applauded on succesful weight loss through lifestyle change, and encouraged to continue same. Weight loss goal set for the next several months. Pt seriously encouraged to consider  bariatric surgery   Disseminated candidiasis Resolved, but remains at high risk for recurrence due to morbid obesity with multiple skin folds, will keep areas as dry as possible with cornstarch and use nystatin powder as needed  Allergic rhinitis Uncontrolled with increased symptoms due to season change, pt to use flonase  daily  ESSENTIAL HYPERTENSION UnControlled, no change in medication, pt currently out of one of her meds, the importance of compliance is stressed DASH diet and commitment to daily physical activity for  a minimum of 30 minutes discussed and encouraged, as a part of hypertension management. The importance of attaining a healthy weight is also discussed.   Laryngitis Chronic painless hoarseness which is  worsening , also has ongoing nicotine use, referred again to ENT for eval, smoking cessation strongly encouraged  GERD (gastroesophageal reflux disease) Uncontrolled, pt to reduce/ stop both nicotine and caffeine, and start PPI  Type 2 diabetes mellitus with diabetic nephropathy Uncontrolled Patient advised to reduce carb and sweets, commit to regular physical activity, take meds as prescribed, test blood as directed, and attempt to lose weight, to improve blood sugar control. Updated lab needed at/ before next visit. Still needs eye exam, she is aware, finances are the stated constraint currently Increased yeast vaginitis with farxiga, fluconazole sent in for as needed use, she is encouraged strongly to reduce carb intake also

## 2013-05-15 ENCOUNTER — Ambulatory Visit (INDEPENDENT_AMBULATORY_CARE_PROVIDER_SITE_OTHER): Payer: Managed Care, Other (non HMO) | Admitting: Otolaryngology

## 2013-05-15 DIAGNOSIS — K219 Gastro-esophageal reflux disease without esophagitis: Secondary | ICD-10-CM

## 2013-05-15 DIAGNOSIS — R490 Dysphonia: Secondary | ICD-10-CM

## 2013-07-17 ENCOUNTER — Ambulatory Visit (INDEPENDENT_AMBULATORY_CARE_PROVIDER_SITE_OTHER): Payer: Managed Care, Other (non HMO) | Admitting: Otolaryngology

## 2013-08-04 ENCOUNTER — Other Ambulatory Visit: Payer: Self-pay | Admitting: Family Medicine

## 2013-08-06 ENCOUNTER — Other Ambulatory Visit: Payer: Self-pay

## 2013-08-06 MED ORDER — GLIMEPIRIDE 4 MG PO TABS
ORAL_TABLET | ORAL | Status: DC
Start: 2013-08-06 — End: 2013-08-07

## 2013-08-07 ENCOUNTER — Other Ambulatory Visit: Payer: Self-pay

## 2013-08-07 MED ORDER — GLIMEPIRIDE 4 MG PO TABS: ORAL_TABLET | ORAL | Status: DC

## 2013-09-03 ENCOUNTER — Other Ambulatory Visit: Payer: Self-pay | Admitting: Family Medicine

## 2013-10-31 ENCOUNTER — Other Ambulatory Visit: Payer: Self-pay

## 2013-10-31 ENCOUNTER — Other Ambulatory Visit: Payer: Self-pay | Admitting: Family Medicine

## 2013-10-31 MED ORDER — HYDROCHLOROTHIAZIDE 25 MG PO TABS: ORAL_TABLET | ORAL | Status: DC

## 2013-10-31 MED ORDER — SITAGLIPTIN PHOSPHATE 100 MG PO TABS: ORAL_TABLET | ORAL | Status: DC

## 2013-10-31 MED ORDER — LOSARTAN POTASSIUM 50 MG PO TABS: ORAL_TABLET | ORAL | Status: DC

## 2013-11-01 LAB — COMPLETE METABOLIC PANEL WITH GFR
ALT: 17 U/L (ref 0–35)
AST: 15 U/L (ref 0–37)
Albumin: 4 g/dL (ref 3.5–5.2)
Alkaline Phosphatase: 55 U/L (ref 39–117)
BILIRUBIN TOTAL: 0.8 mg/dL (ref 0.2–1.2)
BUN: 10 mg/dL (ref 6–23)
CALCIUM: 9.7 mg/dL (ref 8.4–10.5)
CHLORIDE: 98 meq/L (ref 96–112)
CO2: 28 meq/L (ref 19–32)
CREATININE: 0.63 mg/dL (ref 0.50–1.10)
GLUCOSE: 131 mg/dL — AB (ref 70–99)
Potassium: 3.8 mEq/L (ref 3.5–5.3)
Sodium: 140 mEq/L (ref 135–145)
Total Protein: 7.4 g/dL (ref 6.0–8.3)

## 2013-11-01 LAB — LIPID PANEL
CHOLESTEROL: 177 mg/dL (ref 0–200)
HDL: 58 mg/dL (ref >39)
LDL Cholesterol: 90 mg/dL (ref 0–99)
TRIGLYCERIDES: 147 mg/dL (ref <150)
Total CHOL/HDL Ratio: 3.1 Ratio
VLDL: 29 mg/dL (ref 0–40)

## 2013-11-01 LAB — HEMOGLOBIN A1C
Hgb A1c MFr Bld: 7.5 % — ABNORMAL HIGH (ref <5.7)
Mean Plasma Glucose: 169 mg/dL — ABNORMAL HIGH (ref <117)

## 2013-11-04 ENCOUNTER — Ambulatory Visit (INDEPENDENT_AMBULATORY_CARE_PROVIDER_SITE_OTHER): Payer: Managed Care, Other (non HMO) | Admitting: Family Medicine

## 2013-11-04 ENCOUNTER — Encounter: Payer: Self-pay | Admitting: Family Medicine

## 2013-11-04 ENCOUNTER — Other Ambulatory Visit (HOSPITAL_COMMUNITY)
Admission: RE | Admit: 2013-11-04 | Discharge: 2013-11-04 | Disposition: A | Discharge status: Home / Self Care | Payer: Managed Care, Other (non HMO) | Source: Ambulatory Visit | Attending: Family Medicine | Admitting: Family Medicine

## 2013-11-04 VITALS — BP 134/80 | HR 84 | Resp 18 | Ht 64.5 in | Wt 325.0 lb

## 2013-11-04 DIAGNOSIS — Z01419 Encounter for gynecological examination (general) (routine) without abnormal findings: Secondary | ICD-10-CM | POA: Diagnosis present

## 2013-11-04 DIAGNOSIS — F17200 Nicotine dependence, unspecified, uncomplicated: Secondary | ICD-10-CM

## 2013-11-04 DIAGNOSIS — L0293 Carbuncle, unspecified: Secondary | ICD-10-CM

## 2013-11-04 DIAGNOSIS — Z1211 Encounter for screening for malignant neoplasm of colon: Secondary | ICD-10-CM

## 2013-11-04 DIAGNOSIS — Z124 Encounter for screening for malignant neoplasm of cervix: Secondary | ICD-10-CM

## 2013-11-04 DIAGNOSIS — Z0000 Encounter for general adult medical examination without abnormal findings: Secondary | ICD-10-CM

## 2013-11-04 DIAGNOSIS — E1121 Type 2 diabetes mellitus with diabetic nephropathy: Secondary | ICD-10-CM

## 2013-11-04 DIAGNOSIS — E785 Hyperlipidemia, unspecified: Secondary | ICD-10-CM

## 2013-11-04 DIAGNOSIS — I10 Essential (primary) hypertension: Secondary | ICD-10-CM

## 2013-11-04 DIAGNOSIS — N760 Acute vaginitis: Secondary | ICD-10-CM | POA: Insufficient documentation

## 2013-11-04 DIAGNOSIS — B377 Candidal sepsis: Secondary | ICD-10-CM

## 2013-11-04 DIAGNOSIS — L0292 Furuncle, unspecified: Secondary | ICD-10-CM

## 2013-11-04 DIAGNOSIS — M7551 Bursitis of right shoulder: Secondary | ICD-10-CM

## 2013-11-04 LAB — POC HEMOCCULT BLD/STL (OFFICE/1-CARD/DIAGNOSTIC): Fecal Occult Blood, POC: NEGATIVE

## 2013-11-04 MED ORDER — CLOTRIMAZOLE 2 % VA CREA: 1.0000 | TOPICAL_CREAM | Freq: Every day | VAGINAL | Status: DC

## 2013-11-04 MED ORDER — PREDNISONE 5 MG PO TABS: 5.0000 mg | ORAL_TABLET | Freq: Two times a day (BID) | ORAL | Status: AC

## 2013-11-04 MED ORDER — FLUCONAZOLE 150 MG PO TABS: ORAL_TABLET | ORAL | Status: DC

## 2013-11-04 MED ORDER — IBUPROFEN 800 MG PO TABS: 800.0000 mg | ORAL_TABLET | Freq: Three times a day (TID) | ORAL | Status: DC

## 2013-11-04 MED ORDER — DOXYCYCLINE HYCLATE 100 MG PO TABS: 100.0000 mg | ORAL_TABLET | Freq: Two times a day (BID) | ORAL | Status: DC

## 2013-11-04 MED ORDER — NYSTATIN 100000 UNIT/GM EX POWD: CUTANEOUS | Status: DC

## 2013-11-04 NOTE — Patient Instructions (Addendum)
F/u in 4 month, call if you need me before  Microalb today  You are referred to dr Gershon Crane for diabetic eye exam which is past due  Labs are very good and so is your blood pressure  Excellent weight loss , keep it up  Prednisone and ibuprofen for 5 days only for bursitis in right shoulder , if pain and limited  Mobility continue, call for referral to orthopedics  For boil, doxycycline 1 twice daily for  7days   You have severe yeast infection in the outer genital area, I am prescribing fluconazole tablets and also, intravaginal cream, take a total of 3 of the fluconazole tablets while taking the doxycycline for the abscess. AFTER you finish the doxycycline , THEN take one fluconazole once daily for 1 week  NO lovasatin while on fluconazole  Microalb today   Fasting lipid, cmp and EGFr, hBA1C and CBC in 4 months  Start daily Vit D3 800IU for low vit D , this is oTC

## 2013-11-04 NOTE — Assessment & Plan Note (Addendum)
Left lower abdominal boil , width approx 4.5 cm and draining, oral doxycycline prescribed

## 2013-11-06 LAB — CYTOLOGY - PAP

## 2013-11-06 LAB — MICROALBUMIN / CREATININE URINE RATIO
CREATININE, URINE: 416.7 mg/dL
MICROALB UR: 2.4 mg/dL — AB (ref <2.0)
MICROALB/CREAT RATIO: 5.8 mg/g (ref 0.0–30.0)

## 2013-11-10 NOTE — Assessment & Plan Note (Addendum)
Trying to quit, unwilling to set a quit date at this time Patient counseled for approximately 5 minutes regarding the health risks of ongoing nicotine use, specifically all types of cancer, heart disease, stroke and respiratory failure. The options available for help with cessation ,the behavioral changes to assist the process, and the option to either gradully reduce usage  Or abruptly stop.is also discussed. Pt is also encouraged to set specific goals in number of cigarettes used daily, as well as to set a quit date.

## 2013-11-10 NOTE — Assessment & Plan Note (Signed)
Short course of anti inflammatories prescribed, pt to call back for ortho eval if persists

## 2013-11-10 NOTE — Assessment & Plan Note (Signed)
Severe candidiasis of genital area, oral and topical medication prescribed

## 2013-11-10 NOTE — Assessment & Plan Note (Signed)

## 2013-11-17 DIAGNOSIS — Z1211 Encounter for screening for malignant neoplasm of colon: Secondary | ICD-10-CM | POA: Insufficient documentation

## 2013-11-17 NOTE — Assessment & Plan Note (Signed)
Heme negative stool, no rectal/ anal mass

## 2013-11-17 NOTE — Assessment & Plan Note (Signed)
Controlled, no change in medication Patient advised to reduce carb and sweets, commit to regular physical activity, take meds as prescribed, test blood as directed, and attempt to lose weight, to improve blood sugar control.  

## 2013-11-17 NOTE — Progress Notes (Signed)
Subjective:    Patient ID: Isabella AverLinda D Curb, female    DOB: [birth year], [age] y.o.   MRN: [id number]  HPI    Review of Systems     Objective:   Physical Exam BP 134/80  Pulse 84  Resp 18  Ht 5' 4.5" (1.638 m)  Wt 325 lb (147.419 kg)  BMI 54.94 kg/m2  SpO2 95%  Pleasant morbidly obese female, alert and oriented x 3, in no cardio-pulmonary distress. Afebrile. HEENT No facial trauma or asymetry. Sinuses non tender.  Extra occullar muscles intact, pupils equally reactive to light. External ears normal, tympanic membranes clear. Oropharynx moist, no exudate, good dentition. Neck: supple, no adenopathy,JVD or thyromegaly.No bruits.  Chest: Clear to ascultation bilaterally.No crackles or wheezes. Non tender to palpation  Breast: No asymetry,no masses or lumps. No tenderness. No nipple discharge or inversion. No axillary or supraclavicular adenopathy  Cardiovascular system; Heart sounds normal,  S1 and  S2 ,no S3.  No murmur, or thrill. Apical beat not displaced Peripheral pulses normal.  Abdomen: Soft, non tender, no organomegaly or masses. No bruits. Bowel sounds normal. No guarding, tenderness or rebound.  Rectal:  Normal sphincter tone. No mass.No rectal masses.  Guaiac negative stool.  GU: External genitalia severe vulvovaginitis, candidiasis, with ulceration from excoriated areas , female distribution of hair. Marland Kitchen. Urethral meatus normal in size, no  Prolapse, no lesions visibly  Present. Bladder non tender. Vagina erythematous  moist , with no visible lesions , copious thick white cottage cheese discharge present . Adequate pelvic support no  cystocele or rectocele noted Cervix pink and appears healthy, no lesions or ulcerations noted, no discharge noted from os Uterus normal size, no adnexal masses, no cervical motion or adnexal tenderness. Body habitus limits te ability to accurately assess internal organ size   Musculoskeletal exam: Full ROM of spine,  hips ,  and knees.Markedly reduced ROM right shoulder No deformity ,swelling or crepitus noted. No muscle wasting or atrophy.   Neurologic: Cranial nerves 2 to 12 intact. Power, tone ,sensation and reflexes normal throughout. No disturbance in gait. No tremor.  Skin: Draining abces on left lower abdomen  Pigmentation normal throughout  Psych; Normal mood and affect. Judgement and concentration normal        Assessment & Plan:  Recurrent boils Left lower abdominal boil , width approx 4.5 cm and draining, oral doxycycline prescribed  Annual physical exam Annual exam as documented. Counseling done  re healthy lifestyle involving commitment to 150 minutes exercise per week, heart healthy diet, and attaining healthy weight.The importance of adequate sleep also discussed. Regular seat belt use and home safety, is also discussed. Changes in health habits are decided on by the patient with goals and time frames  set for achieving them. Immunization and cancer screening needs are specifically addressed at this visit.   Bursitis of right shoulder Short course of anti inflammatories prescribed, pt to call back for ortho eval if persists  Nicotine dependence Trying to quit, unwilling to set a quit date at this time Patient counseled for approximately 5 minutes regarding the health risks of ongoing nicotine use, specifically all types of cancer, heart disease, stroke and respiratory failure. The options available for help with cessation ,the behavioral changes to assist the process, and the option to either gradully reduce usage  Or abruptly stop.is also discussed. Pt is also encouraged to set specific goals in number of cigarettes used daily, as well as to set a quit date.   Vaginitis and vulvovaginitis  Severe candidiasis of genital area, oral and topical medication prescribed  Special screening for malignant neoplasms, colon Heme negative stool, no rectal/ anal mass  Type 2  diabetes mellitus with diabetic nephropathy Controlled, no change in medication Patient advised to reduce carb and sweets, commit to regular physical activity, take meds as prescribed, test blood as directed, and attempt to lose weight, to improve blood sugar control.

## 2014-02-20 ENCOUNTER — Other Ambulatory Visit: Payer: Self-pay | Admitting: Family Medicine

## 2014-03-06 LAB — COMPLETE METABOLIC PANEL WITH GFR
ALK PHOS: 59 U/L (ref 39–117)
ALT: 19 U/L (ref 0–35)
AST: 17 U/L (ref 0–37)
Albumin: 3.8 g/dL (ref 3.5–5.2)
BILIRUBIN TOTAL: 0.6 mg/dL (ref 0.2–1.2)
BUN: 15 mg/dL (ref 6–23)
CO2: 28 mEq/L (ref 19–32)
CREATININE: 0.78 mg/dL (ref 0.50–1.10)
Calcium: 9.3 mg/dL (ref 8.4–10.5)
Chloride: 102 mEq/L (ref 96–112)
GFR, Est African American: >89 mL/min
GFR, Est Non African American: >89 mL/min
Glucose, Bld: 140 mg/dL — ABNORMAL HIGH (ref 70–99)
Potassium: 4.6 mEq/L (ref 3.5–5.3)
SODIUM: 139 meq/L (ref 135–145)
TOTAL PROTEIN: 7.1 g/dL (ref 6.0–8.3)

## 2014-03-06 LAB — LIPID PANEL
CHOL/HDL RATIO: 3.3 ratio
Cholesterol: 188 mg/dL (ref 0–200)
HDL: 57 mg/dL (ref >39)
LDL CALC: 108 mg/dL — AB (ref 0–99)
Triglycerides: 115 mg/dL (ref <150)
VLDL: 23 mg/dL (ref 0–40)

## 2014-03-06 LAB — CBC
HCT: 38.9 % (ref 36.0–46.0)
Hemoglobin: 13.3 g/dL (ref 12.0–15.0)
MCH: 32 pg (ref 26.0–34.0)
MCHC: 34.2 g/dL (ref 30.0–36.0)
MCV: 93.7 fL (ref 78.0–100.0)
Platelets: 238 10*3/uL (ref 150–400)
RBC: 4.15 MIL/uL (ref 3.87–5.11)
RDW: 13 % (ref 11.5–15.5)
WBC: 6.7 10*3/uL (ref 4.0–10.5)

## 2014-03-07 LAB — HEMOGLOBIN A1C
Hgb A1c MFr Bld: 7.6 % — ABNORMAL HIGH (ref <5.7)
Mean Plasma Glucose: 171 mg/dL — ABNORMAL HIGH (ref <117)

## 2014-03-10 ENCOUNTER — Ambulatory Visit: Payer: Managed Care, Other (non HMO) | Admitting: Family Medicine

## 2014-03-16 ENCOUNTER — Encounter: Payer: Self-pay | Admitting: Family Medicine

## 2014-03-16 ENCOUNTER — Ambulatory Visit (INDEPENDENT_AMBULATORY_CARE_PROVIDER_SITE_OTHER): Payer: Managed Care, Other (non HMO) | Admitting: Family Medicine

## 2014-03-16 VITALS — BP 118/66 | HR 100 | Resp 18 | Ht 65.0 in | Wt 332.0 lb

## 2014-03-16 DIAGNOSIS — E1121 Type 2 diabetes mellitus with diabetic nephropathy: Secondary | ICD-10-CM

## 2014-03-16 DIAGNOSIS — I10 Essential (primary) hypertension: Secondary | ICD-10-CM

## 2014-03-16 DIAGNOSIS — E785 Hyperlipidemia, unspecified: Secondary | ICD-10-CM

## 2014-03-16 DIAGNOSIS — J3089 Other allergic rhinitis: Secondary | ICD-10-CM

## 2014-03-16 MED ORDER — ATORVASTATIN CALCIUM 10 MG PO TABS: 10.0000 mg | ORAL_TABLET | Freq: Every day | ORAL | Status: DC

## 2014-03-16 NOTE — Assessment & Plan Note (Signed)
No current flare 

## 2014-03-16 NOTE — Progress Notes (Signed)
   Subjective:    Patient ID: Isabella Matthews, female    DOB: [birth year], 44 y.o.   MRN: [id number]  HPI  The PT is here for follow up and re-evaluation of chronic medical conditions, medication management and review of any available recent lab and radiology data.  Preventive health is updated, specifically  Cancer screening and Immunization.  Needs mammogram and eye exam still and will schedule Questions or concerns regarding consultations or procedures which the PT has had in the interim are  addressed. The PT denies any adverse reactions to current medications since the last visit.  There are no new concerns.  There are no specific complaints  Denies polyuria, polydipsia, blurred vision , or hypoglycemic episodes.      Review of Systems See HPI Denies recent fever or chills. Denies sinus pressure, nasal congestion, ear pain or sore throat. Denies chest congestion, productive cough or wheezing. Denies chest pains, palpitations and leg swelling Denies abdominal pain, nausea, vomiting,diarrhea or constipation.   Denies dysuria, frequency, hesitancy or incontinence. Denies joint pain, swelling and limitation in mobility. Denies headaches, seizures, numbness, or tingling. Denies depression, anxiety or insomnia. Denies skin break down or rash.        Objective:   Physical Exam BP 118/66 mmHg  Pulse 100  Resp 18  Ht 5\' 5"  (1.651 m)  Wt 332 lb (150.594 kg)  BMI 55.25 kg/m2  SpO2 96% Patient alert and oriented and in no cardiopulmonary distress.  HEENT: No facial asymmetry, EOMI,   oropharynx pink and moist.  Neck supple no JVD, no mass.  Chest: Clear to auscultation bilaterally.  CVS: S1, S2 no murmurs, no S3.Regular rate.  ABD: Soft non tender.   Ext: No edema  MS: Adequate ROM spine, shoulders, hips and knees.  Skin: Intact, no ulcerations or rash noted.  Psych: Good eye contact, normal affect. Memory intact not anxious or depressed appearing.  CNS: CN  2-12 intact, power,  normal throughout.no focal deficits noted.        Assessment & Plan:  Essential hypertension Controlled, no change in medication DASH diet and commitment to daily physical activity for a minimum of 30 minutes discussed and encouraged, as a part of hypertension management. The importance of attaining a healthy weight is also discussed.    Type 2 diabetes mellitus with diabetic nephropathy Deteriorated Dose increase of levimir to 50 units daily Patient advised to reduce carb and sweets, commit to regular physical activity, take meds as prescribed, test blood as directed, and attempt to lose weight, to improve blood sugar control. Updated lab needed at/ before next visit.    MORBID OBESITY Unchanged. Patient re-educated about  the importance of commitment to a  minimum of 150 minutes of exercise per week. The importance of healthy food choices with portion control discussed. Encouraged to start a food diary, count calories and to consider  joining a support group. Sample diet sheets offered. Goals set by the patient for the next several months.      Allergic rhinitis No current flare

## 2014-03-16 NOTE — Assessment & Plan Note (Signed)
Controlled, no change in medication DASH diet and commitment to daily physical activity for a minimum of 30 minutes discussed and encouraged, as a part of hypertension management. The importance of attaining a healthy weight is also discussed.  

## 2014-03-16 NOTE — Assessment & Plan Note (Signed)
Deteriorated Dose increase of levimir to 50 units daily Patient advised to reduce carb and sweets, commit to regular physical activity, take meds as prescribed, test blood as directed, and attempt to lose weight, to improve blood sugar control. Updated lab needed at/ before next visit.

## 2014-03-16 NOTE — Patient Instructions (Addendum)
F/u in 4 month, call if you need me before  You need to start new lipitor 57m one at bedtime for cholesterol and o lower heart disease risk  Follow low fat diet please   Please increase levimir to 50 units daily, and count carbs   It is important that you exercise regularly at least 30 minutes 5 times a week. If you develop chest pain, have severe difficulty breathing, or feel very tired, stop exercising immediately and seek medical attention    Fasting lipid, cmp and EGFr, hBA1c and TSH in 4 month  Check on bypass surgery through your ins  It is important that you exercise regularly at least 30 minutes 5 times a week. If you develop chest pain, have severe difficulty breathing, or feel very tired, stop exercising immediately and seek medical attention   PLS get eye exam and mammogram in next 4 month

## 2014-03-16 NOTE — Assessment & Plan Note (Signed)
Unchanged. Patient re-educated about  the importance of commitment to a  minimum of 150 minutes of exercise per week. The importance of healthy food choices with portion control discussed. Encouraged to start a food diary, count calories and to consider  joining a support group. Sample diet sheets offered. Goals set by the patient for the next several months.    

## 2014-03-17 MED ORDER — INSULIN DETEMIR 100 UNIT/ML FLEXPEN: 50.0000 [IU] | PEN_INJECTOR | Freq: Every day | SUBCUTANEOUS | Status: DC

## 2014-03-17 NOTE — Addendum Note (Signed)
Addended by: Anthoney HaradaSLADE, COURTNEY B on: 03/17/2014 08:48 AM   Modules accepted: Orders

## 2014-03-25 ENCOUNTER — Other Ambulatory Visit: Payer: Self-pay | Admitting: Family Medicine

## 2014-03-25 DIAGNOSIS — Z1231 Encounter for screening mammogram for malignant neoplasm of breast: Secondary | ICD-10-CM

## 2014-03-30 ENCOUNTER — Ambulatory Visit (HOSPITAL_COMMUNITY)
Admission: RE | Admit: 2014-03-30 | Discharge: 2014-03-30 | Disposition: A | Discharge status: Home / Self Care | Payer: Managed Care, Other (non HMO) | Source: Ambulatory Visit | Attending: Family Medicine | Admitting: Family Medicine

## 2014-03-30 DIAGNOSIS — Z1231 Encounter for screening mammogram for malignant neoplasm of breast: Secondary | ICD-10-CM | POA: Insufficient documentation

## 2014-04-28 LAB — HM DIABETES EYE EXAM

## 2014-07-11 LAB — LIPID PANEL
Cholesterol: 198 mg/dL (ref 0–200)
HDL: 72 mg/dL (ref >=46)
LDL CALC: 102 mg/dL — AB (ref 0–99)
TRIGLYCERIDES: 120 mg/dL (ref <150)
Total CHOL/HDL Ratio: 2.8 Ratio
VLDL: 24 mg/dL (ref 0–40)

## 2014-07-11 LAB — COMPLETE METABOLIC PANEL WITH GFR
ALT: 16 U/L (ref 0–35)
AST: 19 U/L (ref 0–37)
Albumin: 3.8 g/dL (ref 3.5–5.2)
Alkaline Phosphatase: 60 U/L (ref 39–117)
BILIRUBIN TOTAL: 0.4 mg/dL (ref 0.2–1.2)
BUN: 15 mg/dL (ref 6–23)
CO2: 24 mEq/L (ref 19–32)
Calcium: 9.4 mg/dL (ref 8.4–10.5)
Chloride: 101 mEq/L (ref 96–112)
Creat: 0.74 mg/dL (ref 0.50–1.10)
GFR, Est Non African American: >89 mL/min
GLUCOSE: 140 mg/dL — AB (ref 70–99)
Potassium: 4.5 mEq/L (ref 3.5–5.3)
Sodium: 140 mEq/L (ref 135–145)
Total Protein: 7.3 g/dL (ref 6.0–8.3)

## 2014-07-11 LAB — HEMOGLOBIN A1C
Hgb A1c MFr Bld: 7.6 % — ABNORMAL HIGH (ref <5.7)
Mean Plasma Glucose: 171 mg/dL — ABNORMAL HIGH (ref <117)

## 2014-07-11 LAB — TSH: TSH: 1.603 u[IU]/mL (ref 0.350–4.500)

## 2014-07-15 ENCOUNTER — Encounter: Payer: Self-pay | Admitting: Family Medicine

## 2014-07-15 ENCOUNTER — Ambulatory Visit (INDEPENDENT_AMBULATORY_CARE_PROVIDER_SITE_OTHER): Payer: Managed Care, Other (non HMO) | Admitting: Family Medicine

## 2014-07-15 VITALS — BP 124/80 | HR 83 | Resp 16 | Ht 65.0 in | Wt 330.4 lb

## 2014-07-15 DIAGNOSIS — E785 Hyperlipidemia, unspecified: Secondary | ICD-10-CM | POA: Diagnosis not present

## 2014-07-15 DIAGNOSIS — I10 Essential (primary) hypertension: Secondary | ICD-10-CM | POA: Diagnosis not present

## 2014-07-15 DIAGNOSIS — Z113 Encounter for screening for infections with a predominantly sexual mode of transmission: Secondary | ICD-10-CM

## 2014-07-15 DIAGNOSIS — F17218 Nicotine dependence, cigarettes, with other nicotine-induced disorders: Secondary | ICD-10-CM | POA: Diagnosis not present

## 2014-07-15 DIAGNOSIS — B369 Superficial mycosis, unspecified: Secondary | ICD-10-CM

## 2014-07-15 DIAGNOSIS — L0293 Carbuncle, unspecified: Secondary | ICD-10-CM

## 2014-07-15 DIAGNOSIS — E1121 Type 2 diabetes mellitus with diabetic nephropathy: Secondary | ICD-10-CM

## 2014-07-15 MED ORDER — ATORVASTATIN CALCIUM 10 MG PO TABS: 10.0000 mg | ORAL_TABLET | Freq: Every day | ORAL | Status: DC

## 2014-07-15 MED ORDER — FLUCONAZOLE 150 MG PO TABS: 150.0000 mg | ORAL_TABLET | Freq: Every day | ORAL | Status: DC

## 2014-07-15 MED ORDER — TERBINAFINE HCL 250 MG PO TABS: 250.0000 mg | ORAL_TABLET | Freq: Every day | ORAL | Status: DC

## 2014-07-15 NOTE — Assessment & Plan Note (Signed)
Controlled, no change in medication DASH diet and commitment to daily physical activity for a minimum of 30 minutes discussed and encouraged, as a part of hypertension management. The importance of attaining a healthy weight is also discussed.  BP/Weight 07/15/2014 03/16/2014 11/04/2013 03/25/2013 02/20/2013 01/16/2013 11/25/2012  Systolic BP 124 118 134 140 122 117 138  Diastolic BP 80 66 80 90 84 62 96  Wt. (Lbs) 330.4 332 325 339 345.04 367 358  BMI 54.98 55.25 54.94 57.31 58.33 69.38 60.52

## 2014-07-15 NOTE — Assessment & Plan Note (Signed)

## 2014-07-15 NOTE — Assessment & Plan Note (Signed)
Unchanged Patient re-educated about  the importance of commitment to a  minimum of 150 minutes of exercise per week.  The importance of healthy food choices with portion control discussed. Encouraged to start a food diary, count calories and to consider  joining a support group. Sample diet sheets offered. Goals set by the patient for the next several months.   Weight /BMI 07/15/2014 03/16/2014 11/04/2013  WEIGHT 330 lb 6.4 oz 332 lb 325 lb  HEIGHT 5\' 5"  5\' 5"  5' 4.5"  BMI 54.98 kg/m2 55.25 kg/m2 54.94 kg/m2    Current exercise per week 60 minutes.

## 2014-07-15 NOTE — Patient Instructions (Addendum)
F/u  In 4 month, call if you need me before  Please work on good  health habits so that your health will improve. 1. Commitment to daily physical activity for 30 to 60  minutes, if you are able to do this.  2. Commitment to wise food choices. Aim for half of your  food intake to be vegetable and fruit, one quarter starchy foods, and one quarter protein. Try to eat on a regular schedule  3 meals per day, snacking between meals should be limited to vegetables or fruits or small portions of nuts. 64 ounces of water per day is generally recommended, unless you have specific health conditions, like heart failure or kidney failure where you will need to limit fluid intake.  3. Commitment to sufficient and a  good quality of physical and mental rest daily, generally between 6 to 8 hours per day.  WITH PERSISTANCE AND PERSEVERANCE, THE IMPOSSIBLE , BECOMES THE NORM!   Quit date is July 1, substitute nicorette gum short term,  Eat a veg, call [employer]  Fasting lipid, cmp and EGFR, HBa1C HIV and microalb Oct 17 or after  Take lipitor for cholesterol, take insulin every evening ,a nd test regualrly

## 2014-07-15 NOTE — Assessment & Plan Note (Signed)
Uncontrolled , non compliant, did not take lipitor prescribed  Hyperlipidemia:Low fat diet discussed and encouraged.   Lipid Panel  Lab Results  Component Value Date   CHOL 198 07/10/2014   HDL 72 07/10/2014   LDLCALC 102* 07/10/2014   TRIG 120 07/10/2014   CHOLHDL 2.8 07/10/2014

## 2014-07-16 NOTE — Assessment & Plan Note (Signed)
Controlled, no change in medication Goal for HBa1C is less than 7, no med change , focus on lifestyle change and weight loss Isabella Matthews is reminded of the importance of commitment to daily physical activity for 30 minutes or more, as able and the need to limit carbohydrate intake to 30 to 60 grams per meal to help with blood sugar control.   The need to take medication as prescribed, test blood sugar as directed, and to call between visits if there is a concern that blood sugar is uncontrolled is also discussed.   Isabella Matthews is reminded of the importance of daily foot exam, annual eye examination, and good blood sugar, blood pressure and cholesterol control.  Diabetic Labs Latest Ref Rng 07/10/2014 03/06/2014 11/04/2013 10/31/2013 02/18/2013  HbA1c <5.7 % 7.6(H) 7.6(H) - 7.5(H) 7.5(H)  Microalbumin <2.0 mg/dL - - 2.4(H) - -  Micro/Creat Ratio 0.0 - 30.0 mg/g - - 5.8 - -  Chol 0 - 200 mg/dL 481 856 - 314 970  HDL >=46 mg/dL 72 57 - 58 58  Calc LDL 0 - 99 mg/dL 263(Z) 858(I) - 90 93  Triglycerides <150 mg/dL 502 774 - 128 786  Creatinine 0.50 - 1.10 mg/dL 7.67 2.09 - 4.70 9.62   BP/Weight 07/15/2014 03/16/2014 11/04/2013 03/25/2013 02/20/2013 01/16/2013 11/25/2012  Systolic BP 124 118 134 140 122 117 138  Diastolic BP 80 66 80 90 84 62 96  Wt. (Lbs) 330.4 332 325 339 345.04 367 358  BMI 54.98 55.25 54.94 57.31 58.33 69.38 60.52   Foot/eye exam completion dates Latest Ref Rng 04/28/2014 11/04/2013  Eye Exam No Retinopathy No Retinopathy -  Foot exam Order - - -  Foot Form Completion - - Done

## 2014-07-16 NOTE — Assessment & Plan Note (Signed)
No recent flare , improved with better blood sugar control

## 2014-07-16 NOTE — Assessment & Plan Note (Signed)
Current flare, oral fluconazole and terbinafine short term and topical antifungal cream as needed

## 2014-07-16 NOTE — Progress Notes (Signed)
Isabella Matthews     MRN: [id number]      DOB: [birth year]   HPI Isabella Matthews is here for follow up and re-evaluation of chronic medical conditions, medication management and review of any available recent lab and radiology data.  Preventive health is updated, specifically  Cancer screening and Immunization.   Questions or concerns regarding consultations or procedures which the PT has had in the interim are  addressed. The PT denies any adverse reactions to current medications since the last visit.  ROS Denies recent fever or chills. Denies sinus pressure, nasal congestion, ear pain or sore throat. Denies chest congestion, productive cough or wheezing. Denies chest pains, palpitations and leg swelling Denies abdominal pain, nausea, vomiting,diarrhea or constipation.   Denies dysuria, frequency, hesitancy or incontinence. Denies joint pain, swelling and limitation in mobility. Denies headaches, seizures, numbness, or tingling. Denies depression, anxiety or insomnia. Itchy rash under tight breast and white vaginal d/c x 2 weeks Denies polyuria, polydipsia, blurred vision , or hypoglycemic episodes. Not testing sugars regularly, not committed to daily exercise but got gym membership last week and will start in am PE  BP 124/80 mmHg  Pulse 83  Resp 16  Ht  (1.651 m)  Wt 330 lb 6.4 oz (149.868 kg)  BMI 54.98 kg/m2  SpO2 98%  Patient alert and oriented and in no cardiopulmonary distress.  HEENT: No facial asymmetry, EOMI,   oropharynx pink and moist.  Neck supple no JVD, no mass.  Chest: Clear to auscultation bilaterally.  CVS: S1, S2 no murmurs, no S3.Regular rate.  ABD: Soft non tender.  Pelvic ; deferred, presumed candidiasis Ext: No edema  MS: Adequate ROM spine, shoulders, hips and knees.  Skin: fungal and yeast rash under right breast, no bacterial superinfection, no purulent drainage Psych: Good eye contact, normal affect. Memory intact not anxious or  depressed appearing.  CNS: CN 2-12 intact, power,  normal throughout.no focal deficits noted.   Assessment & Plan   Essential hypertension Controlled, no change in medication DASH diet and commitment to daily physical activity for a minimum of 30 minutes discussed and encouraged, as a part of hypertension management. The importance of attaining a healthy weight is also discussed.  BP/Weight 07/15/2014 03/16/2014 11/04/2013 03/25/2013 02/20/2013 01/16/2013 11/25/2012  Systolic BP 124 118 134 140 122 117 138  Diastolic BP 80 66 80 90 84 62 96  Wt. (Lbs) 330.4 332 325 339 345.04 367 358  BMI 54.98 55.25 54.94 57.31 58.33 69.38 60.52        Hyperlipidemia with target LDL less than 100 Uncontrolled , non compliant, did not take lipitor prescribed  Hyperlipidemia:Low fat diet discussed and encouraged.   Lipid Panel  Lab Results  Component Value Date   CHOL 198 07/10/2014   HDL 72 07/10/2014   LDLCALC 102* 07/10/2014   TRIG 120 07/10/2014   CHOLHDL 2.8 07/10/2014        MORBID OBESITY Unchanged Patient re-educated about  the importance of commitment to a  minimum of 150 minutes of exercise per week.  The importance of healthy food choices with portion control discussed. Encouraged to start a food diary, count calories and to consider  joining a support group. Sample diet sheets offered. Goals set by the patient for the next several months.   Weight /BMI 07/15/2014 03/16/2014 11/04/2013  WEIGHT 330 lb 6.4 oz 332 lb 325 lb  HEIGHT   5' 4.5"  BMI 54.98 kg/m2 55.25 kg/m2 54.94 kg/m2  Current exercise per week 60 minutes.   Nicotine dependence Patient counseled for approximately 5 minutes regarding the health risks of ongoing nicotine use, specifically all types of cancer, heart disease, stroke and respiratory failure. The options available for help with cessation ,the behavioral changes to assist the process, and the option to either gradully reduce usage  Or  abruptly stop.is also discussed. Pt is also encouraged to set specific goals in number of cigarettes used daily, as well as to set a quit date.  Number of cigarettes/cigars currently smoking daily:3   Type 2 diabetes mellitus with diabetic nephropathy Controlled, no change in medication Goal for HBa1C is less than 7, no med change , focus on lifestyle change and weight loss Isabella Matthews is reminded of the importance of commitment to daily physical activity for 30 minutes or more, as able and the need to limit carbohydrate intake to 30 to 60 grams per meal to help with blood sugar control.   The need to take medication as prescribed, test blood sugar as directed, and to call between visits if there is a concern that blood sugar is uncontrolled is also discussed.   Isabella Matthews is reminded of the importance of daily foot exam, annual eye examination, and good blood sugar, blood pressure and cholesterol control.  Diabetic Labs Latest Ref Rng 07/10/2014 03/06/2014 11/04/2013 10/31/2013 02/18/2013  HbA1c <5.7 % 7.6(H) 7.6(H) - 7.5(H) 7.5(H)  Microalbumin <2.0 mg/dL - - 2.4(H) - -  Micro/Creat Ratio 0.0 - 30.0 mg/g - - 5.8 - -  Chol 0 - 200 mg/dL 778 242 - 353 614  HDL >=46 mg/dL 72 57 - 58 58  Calc LDL 0 - 99 mg/dL 431(V) 400(Q) - 90 93  Triglycerides <150 mg/dL 676 195 - 093 267  Creatinine 0.50 - 1.10 mg/dL 1.24 5.80 - 9.98 3.38   BP/Weight 07/15/2014 03/16/2014 11/04/2013 03/25/2013 02/20/2013 01/16/2013 11/25/2012  Systolic BP 124 118 134 140 122 117 138  Diastolic BP 80 66 80 90 84 62 96  Wt. (Lbs) 330.4 332 325 339 345.04 367 358  BMI 54.98 55.25 54.94 57.31 58.33 69.38 60.52   Foot/eye exam completion dates Latest Ref Rng 04/28/2014 11/04/2013  Eye Exam No Retinopathy No Retinopathy -  Foot exam Order - - -  Foot Form Completion - - Done         Dermatomycosis Current flare, oral fluconazole and terbinafine short term and topical antifungal cream as needed  Recurrent boils No  recent flare , improved with better blood sugar control

## 2014-07-17 ENCOUNTER — Other Ambulatory Visit: Payer: Self-pay

## 2014-07-17 MED ORDER — SITAGLIPTIN PHOSPHATE 100 MG PO TABS: 100.0000 mg | ORAL_TABLET | Freq: Every day | ORAL | Status: DC

## 2014-07-17 MED ORDER — GLIMEPIRIDE 4 MG PO TABS: 4.0000 mg | ORAL_TABLET | Freq: Two times a day (BID) | ORAL | Status: DC

## 2014-07-17 MED ORDER — LOSARTAN POTASSIUM 50 MG PO TABS: 50.0000 mg | ORAL_TABLET | Freq: Every day | ORAL | Status: DC

## 2014-07-17 MED ORDER — DAPAGLIFLOZIN PROPANEDIOL 10 MG PO TABS: 10.0000 mg | ORAL_TABLET | Freq: Every day | ORAL | Status: DC

## 2014-11-13 ENCOUNTER — Other Ambulatory Visit: Payer: Self-pay | Admitting: Family Medicine

## 2014-11-14 LAB — COMPLETE METABOLIC PANEL WITH GFR
ALT: 27 U/L (ref 6–29)
AST: 31 U/L (ref 10–35)
Albumin: 4.1 g/dL (ref 3.6–5.1)
Alkaline Phosphatase: 67 U/L (ref 33–115)
BILIRUBIN TOTAL: 0.4 mg/dL (ref 0.2–1.2)
BUN: 11 mg/dL (ref 7–25)
CHLORIDE: 106 mmol/L (ref 98–110)
CO2: 25 mmol/L (ref 20–31)
CREATININE: 0.55 mg/dL (ref 0.50–1.10)
Calcium: 9 mg/dL (ref 8.6–10.2)
GFR, Est African American: >89 mL/min (ref >=60)
GFR, Est Non African American: >89 mL/min (ref >=60)
GLUCOSE: 212 mg/dL — AB (ref 65–99)
Potassium: 4.3 mmol/L (ref 3.5–5.3)
SODIUM: 141 mmol/L (ref 135–146)
TOTAL PROTEIN: 7.3 g/dL (ref 6.1–8.1)

## 2014-11-14 LAB — MICROALBUMIN / CREATININE URINE RATIO
Creatinine, Urine: 262 mg/dL (ref 20–320)
Microalb Creat Ratio: 52 mcg/mg creat — ABNORMAL HIGH (ref <30)
Microalb, Ur: 13.7 mg/dL

## 2014-11-14 LAB — LIPID PANEL
CHOL/HDL RATIO: 2.6 ratio (ref <=5.0)
Cholesterol: 177 mg/dL (ref 125–200)
HDL: 69 mg/dL (ref >=46)
LDL CALC: 84 mg/dL (ref <130)
TRIGLYCERIDES: 120 mg/dL (ref <150)
VLDL: 24 mg/dL (ref <30)

## 2014-11-14 LAB — HIV ANTIBODY (ROUTINE TESTING W REFLEX): HIV 1&2 Ab, 4th Generation: NONREACTIVE

## 2014-11-14 LAB — HEMOGLOBIN A1C
HEMOGLOBIN A1C: 8.4 % — AB (ref <5.7)
Mean Plasma Glucose: 194 mg/dL — ABNORMAL HIGH (ref <117)

## 2014-11-18 ENCOUNTER — Encounter: Payer: Self-pay | Admitting: Family Medicine

## 2014-11-18 ENCOUNTER — Ambulatory Visit (INDEPENDENT_AMBULATORY_CARE_PROVIDER_SITE_OTHER): Payer: Managed Care, Other (non HMO) | Admitting: Family Medicine

## 2014-11-18 VITALS — BP 122/84 | HR 92 | Resp 16 | Ht 65.0 in | Wt 328.0 lb

## 2014-11-18 DIAGNOSIS — E1121 Type 2 diabetes mellitus with diabetic nephropathy: Secondary | ICD-10-CM

## 2014-11-18 DIAGNOSIS — M170 Bilateral primary osteoarthritis of knee: Secondary | ICD-10-CM

## 2014-11-18 DIAGNOSIS — B369 Superficial mycosis, unspecified: Secondary | ICD-10-CM

## 2014-11-18 DIAGNOSIS — I10 Essential (primary) hypertension: Secondary | ICD-10-CM | POA: Diagnosis not present

## 2014-11-18 DIAGNOSIS — Z794 Long term (current) use of insulin: Secondary | ICD-10-CM

## 2014-11-18 DIAGNOSIS — E785 Hyperlipidemia, unspecified: Secondary | ICD-10-CM

## 2014-11-18 DIAGNOSIS — F17209 Nicotine dependence, unspecified, with unspecified nicotine-induced disorders: Secondary | ICD-10-CM | POA: Diagnosis not present

## 2014-11-18 DIAGNOSIS — B377 Candidal sepsis: Secondary | ICD-10-CM | POA: Diagnosis not present

## 2014-11-18 MED ORDER — INSULIN DETEMIR 100 UNIT/ML FLEXPEN: 60.0000 [IU] | PEN_INJECTOR | Freq: Every day | SUBCUTANEOUS | Status: DC

## 2014-11-18 MED ORDER — GLUCOSE BLOOD VI STRP: ORAL_STRIP | Status: DC

## 2014-11-18 MED ORDER — NYSTATIN 100000 UNIT/GM EX POWD: CUTANEOUS | Status: DC

## 2014-11-18 MED ORDER — TRAMADOL HCL 50 MG PO TABS: 50.0000 mg | ORAL_TABLET | Freq: Every day | ORAL | Status: DC

## 2014-11-18 NOTE — Patient Instructions (Signed)
F/u with rectal in 3 month, call if you need me before  HBA1C ,fasting chem 7 and EGFR in 3 month   Nystatin powder refilled   Tramadol 50 mg one at bedtime, new for knee pain  Increase levemir to 60 units   Strips  For testing 3 times daily  Use tylenol 500 mg one twice daily for knee pain''   PLS stop smoking  Blood pressure, , cholesterol, kidney and liver function are good  Foot exam today is good except for scaling on heels

## 2014-11-18 NOTE — Progress Notes (Signed)
Subjective:    Patient ID: Isabella Matthews, female    DOB: March 31, 1970, 44 y.o.   MRN: [id number]  HPI   Isabella Matthews     MRN: [id number]      DOB: May 28, 1970   HPI Isabella Matthews is here for follow up and re-evaluation of chronic medical conditions, medication management and review of any available recent lab and radiology data.  Preventive health is updated, specifically  Cancer screening and Immunization.   Questions or concerns regarding consultations or procedures which the PT has had in the interim are  Addressed. C/o increased bilateral knee pain, denies any recent trauma and has had no falls Denies polyuria, polydipsia, blurred vision , or hypoglycemic episodes. Not testing as frequently as she should, and reports being out of some of her diabetes med  ROS Denies recent fever or chills. Denies sinus pressure, nasal congestion, ear pain or sore throat. Denies chest congestion, productive cough or wheezing. Denies chest pains, palpitations and leg swelling Denies abdominal pain, nausea, vomiting,diarrhea or constipation.   Denies dysuria, frequency, hesitancy or incontinence.  Denies headaches, seizures, numbness, or tingling. Denies depression, anxiety or insomnia. Denies skin break down or rash.   PE  BP 122/84 mmHg  Pulse 92  Resp 16  Ht  (1.651 m)  Wt 328 lb (148.78 kg)  BMI 54.58 kg/m2  SpO2 96%  Patient alert and oriented and in no cardiopulmonary distress.  HEENT: No facial asymmetry, EOMI,   oropharynx pink and moist.  Neck supple no JVD, no mass.  Chest: Clear to auscultation bilaterally.  CVS: S1, S2 no murmurs, no S3.Regular rate.  ABD: Soft non tender.   Ext: No edema  MS: adequate ROM spine, shoulder and hips, reduced in both knees which are swollen and have crepitus.  Skin: Intact, no ulcerations or rash noted.  Psych: Good eye contact, normal affect. Memory intact not anxious or depressed appearing.  CNS: CN 2-12 intact,  power,  normal throughout.no focal deficits noted.   Assessment & Plan   Type 2 diabetes mellitus with diabetic nephropathy Isabella Matthews is reminded of the importance of commitment to daily physical activity for 30 minutes or more, as able and the need to limit carbohydrate intake to 30 to 60 grams per meal to help with blood sugar control.   The need to take medication as prescribed, test blood sugar as directed, and to call between visits if there is a concern that blood sugar is uncontrolled is also discussed.   Isabella Matthews is reminded of the importance of daily foot exam, annual eye examination, and good blood sugar, blood pressure and cholesterol control. Deteriorated, increase dose of levemir  Diabetic Labs Latest Ref Rng 11/13/2014 07/10/2014 03/06/2014 11/04/2013 10/31/2013  HbA1c <5.7 % 8.4(H) 7.6(H) 7.6(H) - 7.5(H)  Microalbumin Not estab mg/dL 87.5 - - 2.4(H) -  Micro/Creat Ratio <30 mcg/mg creat 52(H) - - 5.8 -  Chol 125 - 200 mg/dL 643 329 518 - 841  HDL >=46 mg/dL 69 72 57 - 58  Calc LDL <130 mg/dL 84 660(Y) 301(S) - 90  Triglycerides <150 mg/dL 010 932 355 - 732  Creatinine 0.50 - 1.10 mg/dL 2.02 5.42 7.06 - 2.37   BP/Weight 11/19/2014 11/18/2014 07/15/2014 03/16/2014 11/04/2013 03/25/2013 02/20/2013  Systolic BP 163 122 124 118 134 140 122  Diastolic BP 78 84 80 66 80 90 84  Wt. (Lbs) 328 328 330.4 332 325 339 345.04  BMI 54.58 54.58 54.98 55.25 54.94 57.31 58.33  Foot/eye exam completion dates Latest Ref Rng 04/28/2014 11/04/2013  Eye Exam No Retinopathy No Retinopathy -  Foot exam Order - - -  Foot Form Completion - - Done         Essential hypertension Controlled, no change in medication DASH diet and commitment to daily physical activity for a minimum of 30 minutes discussed and encouraged, as a part of hypertension management. The importance of attaining a healthy weight is also discussed.  BP/Weight 11/19/2014 11/18/2014 07/15/2014 03/16/2014 11/04/2013  03/25/2013 02/20/2013  Systolic BP 163 122 124 118 134 140 122  Diastolic BP 78 84 80 66 80 90 84  Wt. (Lbs) 328 328 330.4 332 325 339 345.04  BMI 54.58 54.58 54.98 55.25 54.94 57.31 58.33        Dermatomycosis No current flare , but relies on availability of mycostatin powder for as needed use, importance of keeping skin dry is stressed also improved blood sugar control  MORBID OBESITY Deteriorated. Patient re-educated about  the importance of commitment to a  minimum of 150 minutes of exercise per week.  The importance of healthy food choices with portion control discussed. Encouraged to start a food diary, count calories and to consider  joining a support group. Sample diet sheets offered. Goals set by the patient for the next several months.   Weight /BMI 11/19/2014 11/18/2014 07/15/2014  WEIGHT 328 lb 328 lb 330 lb 6.4 oz  HEIGHT 5\' 5"  5\' 5"  5\' 5"   BMI 54.58 kg/m2 54.58 kg/m2 54.98 kg/m2    Current exercise per week 60 minutes.   Nicotine dependence Patient counseled for approximately 5 minutes regarding the health risks of ongoing nicotine use, specifically all types of cancer, heart disease, stroke and respiratory failure. The options available for help with cessation ,the behavioral changes to assist the process, and the option to either gradully reduce usage  Or abruptly stop.is also discussed. Pt is also encouraged to set specific goals in number of cigarettes used daily, as well as to set a quit date.  Number of cigarettes/cigars currently smoking daily: 2 to 3   Hyperlipidemia with target LDL less than 100 Hyperlipidemia:Low fat diet discussed and encouraged.   Lipid Panel  Lab Results  Component Value Date   CHOL 177 11/13/2014   HDL 69 11/13/2014   LDLCALC 84 11/13/2014   TRIG 120 11/13/2014   CHOLHDL 2.6 11/13/2014  normal values off of medication, pt prefers not to take statin, despite education re probable CV benefit      Osteoarthritis of both  knees Weigh loss and use of tylenol       Review of Systems     Objective:   Physical Exam        Assessment & Plan:

## 2014-11-19 ENCOUNTER — Emergency Department (HOSPITAL_COMMUNITY)
Admission: EM | Admit: 2014-11-19 | Discharge: 2014-11-19 | Disposition: A | Discharge status: Home / Self Care | Payer: Managed Care, Other (non HMO) | Attending: Emergency Medicine | Admitting: Emergency Medicine

## 2014-11-19 ENCOUNTER — Encounter (HOSPITAL_COMMUNITY): Payer: Self-pay | Admitting: Emergency Medicine

## 2014-11-19 ENCOUNTER — Emergency Department (HOSPITAL_COMMUNITY): Payer: Managed Care, Other (non HMO)

## 2014-11-19 DIAGNOSIS — Z7951 Long term (current) use of inhaled steroids: Secondary | ICD-10-CM | POA: Insufficient documentation

## 2014-11-19 DIAGNOSIS — Z794 Long term (current) use of insulin: Secondary | ICD-10-CM | POA: Insufficient documentation

## 2014-11-19 DIAGNOSIS — E119 Type 2 diabetes mellitus without complications: Secondary | ICD-10-CM | POA: Insufficient documentation

## 2014-11-19 DIAGNOSIS — M25562 Pain in left knee: Secondary | ICD-10-CM | POA: Insufficient documentation

## 2014-11-19 DIAGNOSIS — Z79899 Other long term (current) drug therapy: Secondary | ICD-10-CM | POA: Insufficient documentation

## 2014-11-19 DIAGNOSIS — I10 Essential (primary) hypertension: Secondary | ICD-10-CM | POA: Insufficient documentation

## 2014-11-19 DIAGNOSIS — Z720 Tobacco use: Secondary | ICD-10-CM | POA: Insufficient documentation

## 2014-11-19 DIAGNOSIS — Z8619 Personal history of other infectious and parasitic diseases: Secondary | ICD-10-CM | POA: Insufficient documentation

## 2014-11-19 MED ORDER — HYDROCODONE-ACETAMINOPHEN 5-325 MG PO TABS
2.0000 | ORAL_TABLET | ORAL | Status: DC | PRN
Start: 2014-11-19 — End: 2015-04-01

## 2014-11-19 NOTE — ED Notes (Signed)
No redness noted to left knee

## 2014-11-19 NOTE — ED Provider Notes (Signed)
CSN: [id number]     Arrival date & time 11/19/14  1135 History   First MD Initiated Contact with Patient 11/19/14 1148     Chief Complaint  Patient presents with  . Leg Pain     (Consider location/radiation/quality/duration/timing/severity/associated sxs/prior Treatment) Patient is a 44 y.o. female presenting with leg pain. The history is provided by the patient. No language interpreter was used.  Leg Pain Location:  Knee Injury: no   Knee location:  L knee Pain details:    Quality:  Aching   Radiates to:  Does not radiate   Severity:  Moderate   Onset quality:  Gradual   Timing:  Constant   Progression:  Worsening Chronicity:  New Foreign body present:  No foreign bodies Relieved by:  Nothing Worsened by:  Nothing tried Ineffective treatments:  None tried Associated symptoms: no back pain   Risk factors: obesity   Risk factors: no concern for non-accidental trauma     Past Medical History  Diagnosis Date  . Edema   . Essential hypertension   . Onychomycosis of toenail   . Diabetes mellitus   . Aortic valve disorders   . Aortic stenosis, mild   . Morbid obesity [facility](H[facility])    Past Surgical History  Procedure Laterality Date  . Cesarean section      x2   Family History  Problem Relation Age of Onset  . Hypertension Mother   . Hypertension Father   . Diabetes Father    Social History  Substance Use Topics  . Smoking status: Current Some Day Smoker -- 0.50 packs/day    Types: Cigarettes  . Smokeless tobacco: None  . Alcohol Use: 0.0 oz/week    0 Standard drinks or equivalent per week   OB History    No data available     Review of Systems  Musculoskeletal: Positive for myalgias and joint swelling. Negative for back pain.  All other systems reviewed and are negative.     Allergies  Ace inhibitors; Metformin and related; and Tramadol  Home Medications   Prior to Admission medications   Medication Sig Start Date End Date Taking? Authorizing Provider   dapagliflozin propanediol (FARXIGA) 10 MG TABS tablet Take 10 mg by mouth daily. 07/17/14  Yes Kerri PerchesMargaret E Simpson, MD  ferrous sulfate (SLOW RELEASE IRON) 160 (50 FE) MG TBCR Take 1 tablet by mouth daily.    Yes Historical Provider, MD  fluticasone (FLONASE) 50 MCG/ACT nasal spray Place 2 sprays into both nostrils daily. 03/25/13  Yes Kerri PerchesMargaret E Simpson, MD  glimepiride (AMARYL) 4 MG tablet Take 1 tablet (4 mg total) by mouth 2 (two) times daily. 07/17/14  Yes Kerri PerchesMargaret E Simpson, MD  hydrochlorothiazide (HYDRODIURIL) 25 MG tablet TAKE 1 TABLET BY MOUTH DAILY 11/18/14  Yes Kerri PerchesMargaret E Simpson, MD  Insulin Detemir (LEVEMIR) 100 UNIT/ML Pen Inject 60 Units into the skin daily at 10 pm. 11/18/14  Yes Kerri PerchesMargaret E Simpson, MD  losartan (COZAAR) 50 MG tablet Take 1 tablet (50 mg total) by mouth daily. 07/17/14  Yes Kerri PerchesMargaret E Simpson, MD  nystatin (MYCOSTATIN) powder Apply to affected areas three times daily 11/18/14  Yes Kerri PerchesMargaret E Simpson, MD  sitaGLIPtin (JANUVIA) 100 MG tablet Take 1 tablet (100 mg total) by mouth daily. 07/17/14  Yes Kerri PerchesMargaret E Simpson, MD  traMADol (ULTRAM) 50 MG tablet Take 1 tablet (50 mg total) by mouth at bedtime. 11/18/14  Yes Kerri PerchesMargaret E Simpson, MD  atorvastatin (LIPITOR) 10 MG tablet Take 1 tablet (10  mg total) by mouth daily. Patient not taking: Reported on 11/18/2014 07/15/14   Kerri Perches, MD   BP 163/78 mmHg  Pulse 103  Temp(Src) 98 F (36.7 C) (Oral)  Resp 20  Ht  (1.651 m)  Wt 328 lb (148.78 kg)  BMI 54.58 kg/m2  SpO2 100%  LMP 09/24/2014 Physical Exam  Constitutional: She appears well-developed and well-nourished.  HENT:  Head: Normocephalic.  Right Ear: External ear normal.  Eyes: Pupils are equal, round, and reactive to light.  Neck: Normal range of motion.  Cardiovascular: Normal rate.   Pulmonary/Chest: Effort normal.  Musculoskeletal: She exhibits tenderness.  Very large amount of knee flesh,  Pain with movement,  nv and ns intact.  No gross  instabillity    ED Course  Procedures (including critical care time) Labs Review Labs Reviewed - No data to display  Imaging Review Dg Knee Complete 4 Views Left  11/19/2014  CLINICAL DATA:  Left knee pain radiates into the ankle and left anterior knee. No recent injury. EXAM: LEFT KNEE - COMPLETE 4+ VIEW COMPARISON:  None. FINDINGS: There is moderate degenerative change involving medial, lateral, and patellofemoral compartments. No joint effusion or acute fracture. IMPRESSION: 1.  No evidence for acute  abnormality. 2. Moderate degenerative changes present. Electronically Signed   By: Norva Pavlov M.D.   On: 11/19/2014 12:20   I have personally reviewed and evaluated these images and lab results as part of my medical decision-making.   EKG Interpretation None      MDM degenerative changes knee   Final diagnoses:  Knee pain, left    Hydrocodone Pt knows Dr. Romeo Apple.   Pt advised to see him for evlaution Weight loss,    Elson Areas, PA-C 11/19/14 1236  Raeford Razor, MD 11/24/14 [phone]

## 2014-11-19 NOTE — ED Notes (Signed)
Pt verbalized understanding of no driving and to use caution within 4 hours of taking pain meds due to meds cause drowsiness 

## 2014-11-19 NOTE — Discharge Instructions (Signed)

## 2014-11-19 NOTE — ED Notes (Signed)
Pt c/o of LT leg/knee pain x 1 day. Denies injury or fall. Pt ambulatory.

## 2014-11-19 NOTE — ED Notes (Signed)
PA in room to see pt

## 2014-11-21 ENCOUNTER — Encounter: Payer: Self-pay | Admitting: Family Medicine

## 2014-11-21 DIAGNOSIS — M170 Bilateral primary osteoarthritis of knee: Secondary | ICD-10-CM | POA: Insufficient documentation

## 2014-11-21 NOTE — Assessment & Plan Note (Signed)
Hyperlipidemia:Low fat diet discussed and encouraged.   Lipid Panel  Lab Results  Component Value Date   CHOL 177 11/13/2014   HDL 69 11/13/2014   LDLCALC 84 11/13/2014   TRIG 120 11/13/2014   CHOLHDL 2.6 11/13/2014  normal values off of medication, pt prefers not to take statin, despite education re probable CV benefit

## 2014-11-21 NOTE — Assessment & Plan Note (Signed)
Deteriorated. Patient re-educated about  the importance of commitment to a  minimum of 150 minutes of exercise per week.  The importance of healthy food choices with portion control discussed. Encouraged to start a food diary, count calories and to consider  joining a support group. Sample diet sheets offered. Goals set by the patient for the next several months.   Weight /BMI 11/19/2014 11/18/2014 07/15/2014  WEIGHT 328 lb 328 lb 330 lb 6.4 oz  HEIGHT 5\' 5"  5\' 5"  5\' 5"   BMI 54.58 kg/m2 54.58 kg/m2 54.98 kg/m2    Current exercise per week 60 minutes.

## 2014-11-21 NOTE — Assessment & Plan Note (Signed)
Weigh loss and use of tylenol

## 2014-11-21 NOTE — Assessment & Plan Note (Signed)
No current flare , but relies on availability of mycostatin powder for as needed use, importance of keeping skin dry is stressed also improved blood sugar control

## 2014-11-21 NOTE — Assessment & Plan Note (Signed)
Controlled, no change in medication DASH diet and commitment to daily physical activity for a minimum of 30 minutes discussed and encouraged, as a part of hypertension management. The importance of attaining a healthy weight is also discussed.  BP/Weight 11/19/2014 11/18/2014 07/15/2014 03/16/2014 11/04/2013 03/25/2013 02/20/2013  Systolic BP 163 122 124 118 134 140 122  Diastolic BP 78 84 80 66 80 90 84  Wt. (Lbs) 328 328 330.4 332 325 339 345.04  BMI 54.58 54.58 54.98 55.25 54.94 57.31 58.33

## 2014-11-21 NOTE — Assessment & Plan Note (Signed)
Patient counseled for approximately 5 minutes regarding the health risks of ongoing nicotine use, specifically all types of cancer, heart disease, stroke and respiratory failure. The options available for help with cessation ,the behavioral changes to assist the process, and the option to either gradully reduce usage  Or abruptly stop.is also discussed. Pt is also encouraged to set specific goals in number of cigarettes used daily, as well as to set a quit date.  Number of cigarettes/cigars currently smoking daily:2 to 3  

## 2014-11-21 NOTE — Assessment & Plan Note (Signed)
Isabella Matthews is reminded of the importance of commitment to daily physical activity for 30 minutes or more, as able and the need to limit carbohydrate intake to 30 to 60 grams per meal to help with blood sugar control.   The need to take medication as prescribed, test blood sugar as directed, and to call between visits if there is a concern that blood sugar is uncontrolled is also discussed.   Isabella Matthews is reminded of the importance of daily foot exam, annual eye examination, and good blood sugar, blood pressure and cholesterol control. Deteriorated, increase dose of levemir  Diabetic Labs Latest Ref Rng 11/13/2014 07/10/2014 03/06/2014 11/04/2013 10/31/2013  HbA1c <5.7 % 8.4(H) 7.6(H) 7.6(H) - 7.5(H)  Microalbumin Not estab mg/dL 69.613.7 - - 2.4(H) -  Micro/Creat Ratio <30 mcg/mg creat 52(H) - - 5.8 -  Chol 125 - 200 mg/dL 295177 284198 132188 - 440177  HDL >=46 mg/dL 69 72 57 - 58  Calc LDL <130 mg/dL 84 102(V102(H) 253(G108(H) - 90  Triglycerides <150 mg/dL 644120 034120 742115 - 595147  Creatinine 0.50 - 1.10 mg/dL 6.380.55 7.560.74 4.330.78 - 2.950.63   BP/Weight 11/19/2014 11/18/2014 07/15/2014 03/16/2014 11/04/2013 03/25/2013 02/20/2013  Systolic BP 163 122 124 118 134 140 122  Diastolic BP 78 84 80 66 80 90 84  Wt. (Lbs) 328 328 330.4 332 325 339 345.04  BMI 54.58 54.58 54.98 55.25 54.94 57.31 58.33   Foot/eye exam completion dates Latest Ref Rng 04/28/2014 11/04/2013  Eye Exam No Retinopathy No Retinopathy -  Foot exam Order - - -  Foot Form Completion - - Done

## 2014-11-22 ENCOUNTER — Encounter (HOSPITAL_COMMUNITY): Payer: Self-pay | Admitting: Emergency Medicine

## 2014-11-22 ENCOUNTER — Emergency Department (HOSPITAL_COMMUNITY)
Admission: EM | Admit: 2014-11-22 | Discharge: 2014-11-22 | Disposition: A | Discharge status: Home / Self Care | Payer: Managed Care, Other (non HMO) | Attending: Emergency Medicine | Admitting: Emergency Medicine

## 2014-11-22 DIAGNOSIS — Z794 Long term (current) use of insulin: Secondary | ICD-10-CM | POA: Insufficient documentation

## 2014-11-22 DIAGNOSIS — Z79899 Other long term (current) drug therapy: Secondary | ICD-10-CM | POA: Diagnosis not present

## 2014-11-22 DIAGNOSIS — Z7951 Long term (current) use of inhaled steroids: Secondary | ICD-10-CM | POA: Diagnosis not present

## 2014-11-22 DIAGNOSIS — Z8619 Personal history of other infectious and parasitic diseases: Secondary | ICD-10-CM | POA: Diagnosis not present

## 2014-11-22 DIAGNOSIS — M79605 Pain in left leg: Secondary | ICD-10-CM | POA: Insufficient documentation

## 2014-11-22 DIAGNOSIS — I10 Essential (primary) hypertension: Secondary | ICD-10-CM | POA: Insufficient documentation

## 2014-11-22 DIAGNOSIS — E119 Type 2 diabetes mellitus without complications: Secondary | ICD-10-CM | POA: Diagnosis not present

## 2014-11-22 DIAGNOSIS — M1990 Unspecified osteoarthritis, unspecified site: Secondary | ICD-10-CM | POA: Insufficient documentation

## 2014-11-22 DIAGNOSIS — Z720 Tobacco use: Secondary | ICD-10-CM | POA: Diagnosis not present

## 2014-11-22 MED ORDER — NAPROXEN 375 MG PO TABS: 375.0000 mg | ORAL_TABLET | Freq: Two times a day (BID) | ORAL | Status: DC

## 2014-11-22 MED ORDER — HYDROCODONE-ACETAMINOPHEN 5-325 MG PO TABS
2.0000 | ORAL_TABLET | Freq: Once | ORAL | Status: AC
  Administered 2014-11-22: 2 via ORAL
  Filled 2014-11-22: qty 2

## 2014-11-22 MED ORDER — KETOROLAC TROMETHAMINE 60 MG/2ML IM SOLN
60.0000 mg | Freq: Once | INTRAMUSCULAR | Status: AC
  Administered 2014-11-22: 60 mg via INTRAMUSCULAR
  Filled 2014-11-22: qty 2

## 2014-11-22 NOTE — ED Notes (Signed)
Pt states pain that goes down her left calf down into her foot. Pt states she was here Thursday for the same complaint and was given a per scription but it has not helped. Pt denies injury

## 2014-11-22 NOTE — ED Notes (Signed)
Doppler study scheduled for Lt lower leg for Monday 10/31 @ 1345---

## 2014-11-22 NOTE — Discharge Instructions (Signed)
Follow-up with orthopedic doctor in your primary doctor. Have your ultrasound of your left leg done tomorrow.  If you were given medicines take as directed.  If you are on coumadin or contraceptives realize their levels and effectiveness is altered by many different medicines.  If you have any reaction (rash, tongues swelling, other) to the medicines stop taking and see a physician.    If your blood pressure was elevated in the ER make sure you follow up for management with a primary doctor or return for chest pain, shortness of breath or stroke symptoms.  Please follow up as directed and return to the ER or see a physician for new or worsening symptoms.  Thank you. Filed Vitals:   11/22/14 1546  BP: 147/93  Pulse: 95  Temp: 98.4 F (36.9 C)  TempSrc: Oral  Resp: 20  Height: 5\' 5"  (1.651 m)  Weight: 328 lb (148.78 kg)  SpO2: 99%

## 2014-11-22 NOTE — ED Provider Notes (Signed)
CSN: [id number]     Arrival date & time 11/22/14  1523 History   First MD Initiated Contact with Patient 11/22/14 1753     Chief Complaint  Patient presents with  . Leg Pain     (Consider location/radiation/quality/duration/timing/severity/associated sxs/prior Treatment) HPI Comments: 44 year old female with history of morbid obesity, arthritis, nicotine dependence presents with worsening left medial knee and calf tenderness over the week. Worse with walking and palpation. Patient stands often during the job. Patient had x-ray done in the ER recently no acute fracture degeneration shown. Patient has outpatient follow up with orthopedics of her pain getting more severe. Patient taking narcotics however minimal improvement. No shortness of breath or chest pain. No blood clot history. No recent surgeries or estrogen use. No rheumatoid arthritis history.  Patient is a 44 y.o. female presenting with leg pain. The history is provided by the patient.  Leg Pain Associated symptoms: no back pain, no fever and no neck pain     Past Medical History  Diagnosis Date  . Edema   . Essential hypertension   . Onychomycosis of toenail   . Diabetes mellitus   . Aortic valve disorders   . Aortic stenosis, mild   . Morbid obesity [facility])    Past Surgical History  Procedure Laterality Date  . Cesarean section      x2   Family History  Problem Relation Age of Onset  . Hypertension Mother   . Hypertension Father   . Diabetes Father    Social History  Substance Use Topics  . Smoking status: Current Some Day Smoker -- 0.50 packs/day    Types: Cigarettes  . Smokeless tobacco: None  . Alcohol Use: 0.0 oz/week    0 Standard drinks or equivalent per week   OB History    No data available     Review of Systems  Constitutional: Negative for fever and chills.  HENT: Negative for congestion.   Eyes: Negative for visual disturbance.  Respiratory: Negative for shortness of breath.   Cardiovascular:  Negative for chest pain.  Gastrointestinal: Negative for vomiting and abdominal pain.  Genitourinary: Negative for dysuria and flank pain.  Musculoskeletal: Positive for arthralgias. Negative for back pain, neck pain and neck stiffness.  Skin: Negative for rash.  Neurological: Negative for light-headedness and headaches.      Allergies  Ace inhibitors; Metformin and related; and Tramadol  Home Medications   Prior to Admission medications   Medication Sig Start Date End Date Taking? Authorizing Provider  dapagliflozin propanediol (FARXIGA) 10 MG TABS tablet Take 10 mg by mouth daily. 07/17/14  Yes Kerri Perches, MD  ferrous sulfate (SLOW RELEASE IRON) 160 (50 FE) MG TBCR Take 1 tablet by mouth daily.    Yes Historical Provider, MD  fluticasone (FLONASE) 50 MCG/ACT nasal spray Place 2 sprays into both nostrils daily. 03/25/13  Yes Kerri Perches, MD  glimepiride (AMARYL) 4 MG tablet Take 1 tablet (4 mg total) by mouth 2 (two) times daily. 07/17/14  Yes Kerri Perches, MD  hydrochlorothiazide (HYDRODIURIL) 25 MG tablet TAKE 1 TABLET BY MOUTH DAILY 11/18/14  Yes Kerri Perches, MD  HYDROcodone-acetaminophen (NORCO/VICODIN) 5-325 MG tablet Take 2 tablets by mouth every 4 (four) hours as needed. Patient taking differently: Take 2 tablets by mouth every 4 (four) hours as needed for moderate pain.  11/19/14  Yes Lonia Skinner Sofia, PA-C  Insulin Detemir (LEVEMIR) 100 UNIT/ML Pen Inject 60 Units into the skin daily at 10 pm. 11/18/14  Yes Kerri PerchesMargaret E Simpson, MD  losartan (COZAAR) 50 MG tablet Take 1 tablet (50 mg total) by mouth daily. 07/17/14  Yes Kerri PerchesMargaret E Simpson, MD  nystatin (MYCOSTATIN) powder Apply to affected areas three times daily 11/18/14  Yes Kerri PerchesMargaret E Simpson, MD  sitaGLIPtin (JANUVIA) 100 MG tablet Take 1 tablet (100 mg total) by mouth daily. 07/17/14  Yes Kerri PerchesMargaret E Simpson, MD  traMADol (ULTRAM) 50 MG tablet Take 1 tablet (50 mg total) by mouth at bedtime. 11/18/14  Yes  Kerri PerchesMargaret E Simpson, MD  atorvastatin (LIPITOR) 10 MG tablet Take 1 tablet (10 mg total) by mouth daily. Patient not taking: Reported on 11/18/2014 07/15/14   Kerri PerchesMargaret E Simpson, MD  naproxen (NAPROSYN) 375 MG tablet Take 1 tablet (375 mg total) by mouth 2 (two) times daily. 11/22/14   Blane OharaJoshua Ashby Moskal, MD   BP 147/93 mmHg  Pulse 95  Temp(Src) 98.4 F (36.9 C) (Oral)  Resp 20  Ht 5\' 5"  (1.651 m)  Wt 328 lb (148.78 kg)  BMI 54.58 kg/m2  SpO2 99%  LMP 09/24/2014 Physical Exam  Constitutional: She is oriented to person, place, and time. She appears well-developed and well-nourished.  HENT:  Head: Normocephalic and atraumatic.  Eyes: Right eye exhibits no discharge. Left eye exhibits no discharge.  Neck: Normal range of motion. Neck supple. No tracheal deviation present.  Cardiovascular: Normal rate.   Pulmonary/Chest: Effort normal.  Abdominal: Soft.  Musculoskeletal: She exhibits tenderness. She exhibits no edema.  Patient has mild/moderate tenderness medial aspect of left knee and calf no swelling no warmth or sign of infection. Patient has ability to flex extend with discomfort on medial aspect left knee. No focal hip tenderness with range of motion. No ankle tenderness. Swelling in both legs bilateral chronic.  Neurological: She is alert and oriented to person, place, and time.  Skin: Skin is warm. No rash noted.  Psychiatric: She has a normal mood and affect.  Nursing note and vitals reviewed.   ED Course  Procedures (including critical care time) Labs Review Labs Reviewed - No data to display  Imaging Review No results found. I have personally reviewed and evaluated these images and lab results as part of my medical decision-making.   EKG Interpretation None      MDM   Final diagnoses:  Left leg pain   Clinical concern for arthritis secondary to obesity. With persistent pain and calf tenderness plan for ultrasound to moral outpatient. Patient very low risk. Pain  medicines and orthopedic follow-up.  Results and differential diagnosis were discussed with the patient/parent/guardian. Xrays were independently reviewed by myself.  Close follow up outpatient was discussed, comfortable with the plan.   Medications  ketorolac (TORADOL) injection 60 mg (not administered)  HYDROcodone-acetaminophen (NORCO/VICODIN) 5-325 MG per tablet 2 tablet (not administered)    Filed Vitals:   11/22/14 1546  BP: 147/93  Pulse: 95  Temp: 98.4 F (36.9 C)  TempSrc: Oral  Resp: 20  Height: 5\' 5"  (1.651 m)  Weight: 328 lb (148.78 kg)  SpO2: 99%    Final diagnoses:  Left leg pain        Blane OharaJoshua Nimai Burbach, MD 11/22/14 [phone]

## 2014-11-22 NOTE — ED Notes (Signed)
Discharge instructions given to pt - informed of need to return tomorrow for doppler/ultrasound study - Wheeled off unit by brother

## 2014-11-23 ENCOUNTER — Other Ambulatory Visit (HOSPITAL_COMMUNITY): Payer: Self-pay | Admitting: Emergency Medicine

## 2014-11-23 ENCOUNTER — Telehealth: Payer: Self-pay | Admitting: Family Medicine

## 2014-11-23 ENCOUNTER — Ambulatory Visit (HOSPITAL_COMMUNITY)
Admission: RE | Admit: 2014-11-23 | Discharge: 2014-11-23 | Disposition: A | Discharge status: Home / Self Care | Payer: Managed Care, Other (non HMO) | Source: Ambulatory Visit | Attending: Emergency Medicine | Admitting: Emergency Medicine

## 2014-11-23 DIAGNOSIS — M79605 Pain in left leg: Secondary | ICD-10-CM | POA: Diagnosis present

## 2014-11-23 NOTE — Telephone Encounter (Signed)
Patient is calling stating that she had her Lft Leg U/S today which did not show a DVT, but she states that she is hurting and cant hardly walk, what else can she do, please advise?

## 2014-11-24 ENCOUNTER — Other Ambulatory Visit (HOSPITAL_COMMUNITY): Payer: Self-pay | Admitting: Orthopaedic Surgery

## 2014-11-24 ENCOUNTER — Other Ambulatory Visit: Payer: Self-pay

## 2014-11-24 DIAGNOSIS — M25562 Pain in left knee: Secondary | ICD-10-CM

## 2014-11-24 MED ORDER — PREDNISONE 5 MG (21) PO TBPK: 5.0000 mg | ORAL_TABLET | Freq: Every day | ORAL | Status: DC

## 2014-11-24 NOTE — Telephone Encounter (Signed)
Left leg pain. Can hardly walk. Can something be called in for this or referral needed?

## 2014-11-24 NOTE — Telephone Encounter (Signed)
Patient aware and med sent and has a fu appt with keeling on Friday

## 2014-11-24 NOTE — Telephone Encounter (Signed)
Send pred 5 mg dose pack # 21, she just got naproxen, hasmRI ordered for tomorrow by Dr Hilda LiasKeeling so already connected to ortho, of note she had a later appt wih Dr Romeo AppleHarrison, so only adddition is the pred, and verify she has soon appt with Dr Hilda LiasKeeling pls

## 2014-11-25 ENCOUNTER — Ambulatory Visit (HOSPITAL_COMMUNITY)
Admission: RE | Admit: 2014-11-25 | Discharge: 2014-11-25 | Disposition: A | Discharge status: Home / Self Care | Payer: Managed Care, Other (non HMO) | Source: Ambulatory Visit | Attending: Orthopaedic Surgery | Admitting: Orthopaedic Surgery

## 2014-11-25 DIAGNOSIS — M659 Synovitis and tenosynovitis, unspecified: Secondary | ICD-10-CM | POA: Insufficient documentation

## 2014-11-25 DIAGNOSIS — M25462 Effusion, left knee: Secondary | ICD-10-CM | POA: Insufficient documentation

## 2014-11-25 DIAGNOSIS — E669 Obesity, unspecified: Secondary | ICD-10-CM | POA: Diagnosis not present

## 2014-11-25 DIAGNOSIS — M179 Osteoarthritis of knee, unspecified: Secondary | ICD-10-CM | POA: Diagnosis not present

## 2014-11-25 DIAGNOSIS — M25562 Pain in left knee: Secondary | ICD-10-CM | POA: Diagnosis present

## 2014-11-27 ENCOUNTER — Other Ambulatory Visit (HOSPITAL_COMMUNITY): Payer: Self-pay | Admitting: Orthopaedic Surgery

## 2014-11-27 DIAGNOSIS — M25562 Pain in left knee: Secondary | ICD-10-CM

## 2014-11-27 DIAGNOSIS — M79662 Pain in left lower leg: Secondary | ICD-10-CM

## 2014-12-01 ENCOUNTER — Other Ambulatory Visit: Payer: Self-pay | Admitting: Family Medicine

## 2014-12-03 ENCOUNTER — Ambulatory Visit: Payer: Managed Care, Other (non HMO) | Admitting: Orthopedic Surgery

## 2015-02-24 ENCOUNTER — Encounter: Payer: Self-pay | Admitting: Family Medicine

## 2015-02-24 ENCOUNTER — Ambulatory Visit (INDEPENDENT_AMBULATORY_CARE_PROVIDER_SITE_OTHER): Payer: Managed Care, Other (non HMO) | Admitting: Family Medicine

## 2015-02-24 VITALS — BP 132/78 | HR 80 | Resp 18 | Ht 65.0 in | Wt 324.0 lb

## 2015-02-24 DIAGNOSIS — E1121 Type 2 diabetes mellitus with diabetic nephropathy: Secondary | ICD-10-CM | POA: Diagnosis not present

## 2015-02-24 DIAGNOSIS — M170 Bilateral primary osteoarthritis of knee: Secondary | ICD-10-CM | POA: Diagnosis not present

## 2015-02-24 DIAGNOSIS — I10 Essential (primary) hypertension: Secondary | ICD-10-CM

## 2015-02-24 DIAGNOSIS — Z79899 Other long term (current) drug therapy: Secondary | ICD-10-CM

## 2015-02-24 DIAGNOSIS — Z794 Long term (current) use of insulin: Secondary | ICD-10-CM

## 2015-02-24 LAB — HEMOGLOBIN A1C
Hgb A1c MFr Bld: 8.3 % — ABNORMAL HIGH (ref <5.7)
Mean Plasma Glucose: 192 mg/dL — ABNORMAL HIGH (ref <117)

## 2015-02-24 NOTE — Patient Instructions (Addendum)
Annual physical exam in 3.5 month, call if you need me sooner  Numbness in hands likely due to carpal tunnel , you may be referred for testing if you wish  I will notify orthopedic Doc that I will prescribe your pain medication moving forward, sign and follow contract today please  Bariaitric surgery HIGHLY recommended for your weight management  !500 cal diet sheet to be FOLLOWED in the interim  Thanks for choosing [facility], we consider it a privelige to serve you. Labs today  Pain MED  Will be ONE three times per day, current bottle will finish on 2/18, call 2/13 to collect script

## 2015-02-24 NOTE — Progress Notes (Signed)
Subjective:    Patient ID: Isabella Matthews, female    DOB: August 30, 1970, 45 y.o.   MRN: [id number]  HPI   Isabella Matthews     MRN: [id number]      DOB: 1970/06/12   HPI Isabella Matthews is here for follow up and re-evaluation of chronic medical conditions, medication management and review of any available recent lab and radiology data.  Preventive health is updated, specifically  Cancer screening and Immunization.   Questions or concerns regarding consultations or procedures which the PT has had in the interim are  Addressed.Has been seeing ortho for pain management for osteoarthritis of the knee , was extremely swollen and she had fluid aspirated. Requests pain medication from PCP due to cost The PT denies any adverse reactions to current medications since the last visit.  C/o bilateral hand numbness and tingling which is relieved by shaking the hand, no interest in pursuing this currently  Denies polyuria, polydipsia, blurred vision , or hypoglycemic episodes.   ROS Denies recent fever or chills. Denies sinus pressure, nasal congestion, ear pain or sore throat. Denies chest congestion, productive cough or wheezing. Denies chest pains, palpitations and leg swelling Denies abdominal pain, nausea, vomiting,diarrhea or constipation.   Denies dysuria, frequency, hesitancy or incontinence.  Denies headaches, seizures, numbness, or tingling. Denies depression, anxiety or insomnia. Denies skin break down or rash.   PE  BP 132/78 mmHg  Pulse 80  Resp 18  Ht  (1.651 m)  Wt 324 lb (146.965 kg)  BMI 53.92 kg/m2  SpO2 95%  Patient alert and oriented and in no cardiopulmonary distress.  HEENT: No facial asymmetry, EOMI,   oropharynx pink and moist.  Neck supple no JVD, no mass.  Chest: Clear to auscultation bilaterally.  CVS: S1, S2 no murmurs, no S3.Regular rate.  ABD: Soft non tender.   Ext: No edema  MS: Adequate ROM spine, shoulders, hips and reduced in   knees.  Skin: Intact, no ulcerations or rash noted.  Psych: Good eye contact, normal affect. Memory intact not anxious or depressed appearing.  CNS: CN 2-12 intact, power,  normal throughout.no focal deficits noted.   Assessment & Plan   Type 2 diabetes mellitus with diabetic nephropathy Isabella Matthews is reminded of the importance of commitment to daily physical activity for 30 minutes or more, as able and the need to limit carbohydrate intake to 30 to 60 grams per meal to help with blood sugar control.   The need to take medication as prescribed, test blood sugar as directed, and to call between visits if there is a concern that blood sugar is uncontrolled is also discussed.   Isabella Matthews is reminded of the importance of daily foot exam, annual eye examination, and good blood sugar, blood pressure and cholesterol control. Uncontrolled, medication and dietary compliance need to improve, reminded of need to test daily, at leastr twice , and call in if numbers are higher than recommended range for dose adjustments  Diabetic Labs Latest Ref Rng 02/24/2015 11/13/2014 07/10/2014 03/06/2014 11/04/2013  HbA1c <5.7 % 8.3(H) 8.4(H) 7.6(H) 7.6(H) -  Microalbumin Not estab mg/dL - 86.5 - - 2.4(H)  Micro/Creat Ratio <30 mcg/mg creat - 52(H) - - 5.8  Chol 125 - 200 mg/dL - 784 696 295 -  HDL >=28 mg/dL - 69 72 57 -  Calc LDL <130 mg/dL - 84 413(K) 440(N) -  Triglycerides <150 mg/dL - 027 253 664 -  Creatinine 0.50 - 1.10 mg/dL 4.03 4.74  0.74 0.78 -   BP/Weight 02/24/2015 11/22/2014 11/19/2014 11/18/2014 07/15/2014 03/16/2014 11/04/2013  Systolic BP 132 150 163 122 124 118 134  Diastolic BP 78 80 78 84 80 66 80  Wt. (Lbs) 324 328 328 328 330.4 332 325  BMI 53.92 54.58 54.58 54.58 54.98 55.25 54.94   Foot/eye exam completion dates Latest Ref Rng 11/18/2014 04/28/2014  Eye Exam No Retinopathy - No Retinopathy  Foot exam Order - - -  Foot Form Completion - Done -         MORBID  OBESITY Deteriorated. Patient re-educated about  the importance of commitment to a  minimum of 150 minutes of exercise per week.  The importance of healthy food choices with portion control discussed. Encouraged to start a food diary, count calories and to consider  joining a support group. Sample diet sheets offered. Goals set by the patient for the next several months.   Weight /BMI 02/24/2015 11/22/2014 11/19/2014  WEIGHT 324 lb 328 lb 328 lb  HEIGHT     BMI 53.92 kg/m2 54.58 kg/m2 54.58 kg/m2    No current exercise. Encouraged strongly to consider bariatric surgery   Osteoarthritis of both knees Severe , had flare and had  Joint aspiration by ortho. Has been maintained on hydrocodone by ortho which she needs to function, requests pain med be prescribed by PCP due to cost  of copay from specialist Urine drug screen and contract signed, will also notify orthopedic Doc Advised pt strongly of the need to lose weight She verified the regular need for 3 tablets daily of hydrocodone, and denied any excess sedation with the medication  Essential hypertension Controlled, no change in medication DASH diet and commitment to daily physical activity for a minimum of 30 minutes discussed and encouraged, as a part of hypertension management. The importance of attaining a healthy weight is also discussed.  BP/Weight 02/24/2015 11/22/2014 11/19/2014 11/18/2014 07/15/2014 03/16/2014 11/04/2013  Systolic BP 132 150 163 122 124 118 134  Diastolic BP 78 80 78 84 80 66 80  Wt. (Lbs) 324 328 328 328 330.4 332 325  BMI 53.92 54.58 54.58 54.58 54.98 55.25 54.94             Review of Systems     Objective:   Physical Exam        Assessment & Plan:

## 2015-02-25 LAB — COMPLETE METABOLIC PANEL WITH GFR
ALBUMIN: 4.1 g/dL (ref 3.6–5.1)
ALK PHOS: 56 U/L (ref 33–115)
ALT: 17 U/L (ref 6–29)
AST: 17 U/L (ref 10–35)
BILIRUBIN TOTAL: 0.5 mg/dL (ref 0.2–1.2)
BUN: 23 mg/dL (ref 7–25)
CALCIUM: 9.9 mg/dL (ref 8.6–10.2)
CO2: 26 mmol/L (ref 20–31)
Chloride: 99 mmol/L (ref 98–110)
Creat: 0.79 mg/dL (ref 0.50–1.10)
Glucose, Bld: 195 mg/dL — ABNORMAL HIGH (ref 65–99)
POTASSIUM: 4.3 mmol/L (ref 3.5–5.3)
SODIUM: 139 mmol/L (ref 135–146)
TOTAL PROTEIN: 7.3 g/dL (ref 6.1–8.1)

## 2015-02-25 LAB — DRUG SCREEN URINE W/ALC, PAIN MGMT, REFLEX
AMPHETAMINE SCRN UR: NEGATIVE
Barbiturate Quant, Ur: NEGATIVE
Benzodiazepines.: NEGATIVE
COCAINE METABOLITES: NEGATIVE
CREATININE, U: 40.03 mg/dL
Ethyl Alcohol: <10 mg/dL (ref <10)
MARIJUANA METABOLITE: NEGATIVE
Methadone: NEGATIVE
PHENCYCLIDINE (PCP): NEGATIVE
Propoxyphene: NEGATIVE

## 2015-03-01 LAB — OPIATES/OPIOIDS (LC/MS-MS)
Codeine Urine: NEGATIVE ng/mL (ref <50)
HYDROMORPHONE: 134 ng/mL — AB (ref <50)
Hydrocodone: 702 ng/mL — ABNORMAL HIGH (ref <50)
MORPHINE: NEGATIVE ng/mL (ref <50)
NORHYDROCODONE, UR: 775 ng/mL — AB (ref <50)
NOROXYCODONE, UR: NEGATIVE ng/mL (ref <50)
OXYCODONE, UR: NEGATIVE ng/mL (ref <50)
Oxymorphone: NEGATIVE ng/mL (ref <50)

## 2015-03-07 ENCOUNTER — Other Ambulatory Visit: Payer: Self-pay | Admitting: Family Medicine

## 2015-03-07 MED ORDER — HYDROCODONE-ACETAMINOPHEN 5-325 MG PO TABS: ORAL_TABLET | ORAL | Status: DC

## 2015-03-07 NOTE — Assessment & Plan Note (Addendum)
Severe , had flare and had  Joint aspiration by ortho. Has been maintained on hydrocodone by ortho which she needs to function, requests pain med be prescribed by PCP due to cost  of copay from specialist Urine drug screen and contract signed, will also notify orthopedic Doc Advised pt strongly of the need to lose weight She verified the regular need for 3 tablets daily of hydrocodone, and denied any excess sedation with the medication

## 2015-03-07 NOTE — Assessment & Plan Note (Signed)
Controlled, no change in medication DASH diet and commitment to daily physical activity for a minimum of 30 minutes discussed and encouraged, as a part of hypertension management. The importance of attaining a healthy weight is also discussed.  BP/Weight 02/24/2015 11/22/2014 11/19/2014 11/18/2014 07/15/2014 03/16/2014 11/04/2013  Systolic BP 132 150 163 122 124 118 134  Diastolic BP 78 80 78 84 80 66 80  Wt. (Lbs) 324 328 328 328 330.4 332 325  BMI 53.92 54.58 54.58 54.58 54.98 55.25 54.94

## 2015-03-07 NOTE — Assessment & Plan Note (Signed)
Deteriorated. Patient re-educated about  the importance of commitment to a  minimum of 150 minutes of exercise per week.  The importance of healthy food choices with portion control discussed. Encouraged to start a food diary, count calories and to consider  joining a support group. Sample diet sheets offered. Goals set by the patient for the next several months.   Weight /BMI 02/24/2015 11/22/2014 11/19/2014  WEIGHT 324 lb 328 lb 328 lb  HEIGHT     BMI 53.92 kg/m2 54.58 kg/m2 54.58 kg/m2    No current exercise. Encouraged strongly to consider bariatric surgery

## 2015-03-07 NOTE — Assessment & Plan Note (Addendum)
Ms. Conde is reminded of the importance of commitment to daily physical activity for 30 minutes or more, as able and the need to limit carbohydrate intake to 30 to 60 grams per meal to help with blood sugar control.   The need to take medication as prescribed, test blood sugar as directed, and to call between visits if there is a concern that blood sugar is uncontrolled is also discussed.   Ms. Rosenburg is reminded of the importance of daily foot exam, annual eye examination, and good blood sugar, blood pressure and cholesterol control. Uncontrolled, medication and dietary compliance need to improve, reminded of need to test daily, at leastr twice , and call in if numbers are higher than recommended range for dose adjustments  Diabetic Labs Latest Ref Rng 02/24/2015 11/13/2014 07/10/2014 03/06/2014 11/04/2013  HbA1c <5.7 % 8.3(H) 8.4(H) 7.6(H) 7.6(H) -  Microalbumin Not estab mg/dL - 82.9 - - 2.4(H)  Micro/Creat Ratio <30 mcg/mg creat - 52(H) - - 5.8  Chol 125 - 200 mg/dL - 562 130 865 -  HDL >=78 mg/dL - 69 72 57 -  Calc LDL <130 mg/dL - 84 469(G) 295(M) -  Triglycerides <150 mg/dL - 841 324 401 -  Creatinine 0.50 - 1.10 mg/dL 0.27 2.53 6.64 4.03 -   BP/Weight 02/24/2015 11/22/2014 11/19/2014 11/18/2014 07/15/2014 03/16/2014 11/04/2013  Systolic BP 132 150 163 122 124 118 134  Diastolic BP 78 80 78 84 80 66 80  Wt. (Lbs) 324 328 328 328 330.4 332 325  BMI 53.92 54.58 54.58 54.58 54.98 55.25 54.94   Foot/eye exam completion dates Latest Ref Rng 11/18/2014 04/28/2014  Eye Exam No Retinopathy - No Retinopathy  Foot exam Order - - -  Foot Form Completion - Done -

## 2015-03-09 ENCOUNTER — Ambulatory Visit: Payer: Managed Care, Other (non HMO) | Admitting: Orthopaedic Surgery

## 2015-03-09 ENCOUNTER — Other Ambulatory Visit: Payer: Self-pay

## 2015-03-09 MED ORDER — INSULIN DETEMIR 100 UNIT/ML FLEXPEN: 70.0000 [IU] | PEN_INJECTOR | Freq: Every day | SUBCUTANEOUS | Status: DC

## 2015-03-09 NOTE — Addendum Note (Signed)
Addended by: Kandis Fantasia B on: 03/09/2015 08:55 AM   Modules accepted: Orders

## 2015-03-15 ENCOUNTER — Other Ambulatory Visit: Payer: Self-pay | Admitting: Family Medicine

## 2015-03-28 ENCOUNTER — Other Ambulatory Visit: Payer: Self-pay | Admitting: Family Medicine

## 2015-04-01 ENCOUNTER — Other Ambulatory Visit: Payer: Self-pay

## 2015-04-01 MED ORDER — HYDROCODONE-ACETAMINOPHEN 5-325 MG PO TABS: ORAL_TABLET | ORAL | Status: DC

## 2015-05-24 ENCOUNTER — Other Ambulatory Visit: Payer: Self-pay

## 2015-05-24 MED ORDER — GLIMEPIRIDE 4 MG PO TABS: 4.0000 mg | ORAL_TABLET | Freq: Two times a day (BID) | ORAL | Status: DC

## 2015-06-15 ENCOUNTER — Telehealth: Payer: Self-pay

## 2015-06-15 ENCOUNTER — Other Ambulatory Visit: Payer: Self-pay | Admitting: Family Medicine

## 2015-06-15 DIAGNOSIS — Z794 Long term (current) use of insulin: Principal | ICD-10-CM

## 2015-06-15 DIAGNOSIS — E1121 Type 2 diabetes mellitus with diabetic nephropathy: Secondary | ICD-10-CM

## 2015-06-15 DIAGNOSIS — I10 Essential (primary) hypertension: Secondary | ICD-10-CM

## 2015-06-15 NOTE — Telephone Encounter (Signed)
Lab order mailed to address on file

## 2015-06-16 ENCOUNTER — Encounter: Payer: Managed Care, Other (non HMO) | Admitting: Family Medicine

## 2015-06-28 LAB — CBC
HEMATOCRIT: 37.5 % (ref 35.0–45.0)
HEMOGLOBIN: 12.6 g/dL (ref 11.7–15.5)
MCH: 30.4 pg (ref 27.0–33.0)
MCHC: 33.6 g/dL (ref 32.0–36.0)
MCV: 90.6 fL (ref 80.0–100.0)
MPV: 10.3 fL (ref 7.5–12.5)
Platelets: 271 10*3/uL (ref 140–400)
RBC: 4.14 MIL/uL (ref 3.80–5.10)
RDW: 14.1 % (ref 11.0–15.0)
WBC: 6.9 10*3/uL (ref 3.8–10.8)

## 2015-06-28 LAB — HEMOGLOBIN A1C
Hgb A1c MFr Bld: 7.5 % — ABNORMAL HIGH (ref <5.7)
MEAN PLASMA GLUCOSE: 169 mg/dL

## 2015-06-29 LAB — COMPLETE METABOLIC PANEL WITH GFR
ALBUMIN: 4.2 g/dL (ref 3.6–5.1)
ALT: 16 U/L (ref 6–29)
AST: 16 U/L (ref 10–35)
Alkaline Phosphatase: 59 U/L (ref 33–115)
BUN: 15 mg/dL (ref 7–25)
CHLORIDE: 103 mmol/L (ref 98–110)
CO2: 24 mmol/L (ref 20–31)
CREATININE: 0.7 mg/dL (ref 0.50–1.10)
Calcium: 9.2 mg/dL (ref 8.6–10.2)
GFR, Est African American: >89 mL/min (ref >=60)
GFR, Est Non African American: >89 mL/min (ref >=60)
Glucose, Bld: 106 mg/dL — ABNORMAL HIGH (ref 65–99)
POTASSIUM: 3.9 mmol/L (ref 3.5–5.3)
SODIUM: 139 mmol/L (ref 135–146)
TOTAL PROTEIN: 7.2 g/dL (ref 6.1–8.1)
Total Bilirubin: 0.5 mg/dL (ref 0.2–1.2)

## 2015-07-09 ENCOUNTER — Ambulatory Visit (INDEPENDENT_AMBULATORY_CARE_PROVIDER_SITE_OTHER): Payer: Managed Care, Other (non HMO) | Admitting: Family Medicine

## 2015-07-09 ENCOUNTER — Encounter: Payer: Self-pay | Admitting: Family Medicine

## 2015-07-09 VITALS — BP 122/82 | HR 82 | Resp 16 | Ht 65.0 in | Wt 330.1 lb

## 2015-07-09 DIAGNOSIS — Z13220 Encounter for screening for lipoid disorders: Secondary | ICD-10-CM

## 2015-07-09 DIAGNOSIS — Z23 Encounter for immunization: Secondary | ICD-10-CM

## 2015-07-09 DIAGNOSIS — I10 Essential (primary) hypertension: Secondary | ICD-10-CM

## 2015-07-09 DIAGNOSIS — G5600 Carpal tunnel syndrome, unspecified upper limb: Secondary | ICD-10-CM | POA: Insufficient documentation

## 2015-07-09 DIAGNOSIS — Z794 Long term (current) use of insulin: Secondary | ICD-10-CM

## 2015-07-09 DIAGNOSIS — E559 Vitamin D deficiency, unspecified: Secondary | ICD-10-CM

## 2015-07-09 DIAGNOSIS — G5603 Carpal tunnel syndrome, bilateral upper limbs: Secondary | ICD-10-CM

## 2015-07-09 DIAGNOSIS — E1121 Type 2 diabetes mellitus with diabetic nephropathy: Secondary | ICD-10-CM | POA: Diagnosis not present

## 2015-07-09 DIAGNOSIS — M170 Bilateral primary osteoarthritis of knee: Secondary | ICD-10-CM

## 2015-07-09 MED ORDER — NAPROXEN 375 MG PO TABS: 375.0000 mg | ORAL_TABLET | Freq: Two times a day (BID) | ORAL | Status: DC

## 2015-07-09 NOTE — Progress Notes (Signed)
Subjective:    Patient ID: Isabella Matthews, female    DOB: 01-Jun-1970, 45 y.o.   MRN: [id number]  HPI   Isabella Matthews     MRN: [id number]      DOB: 01-Jun-1970   HPI Isabella Matthews is here for follow up and re-evaluation of chronic medical conditions, medication management and review of any available recent lab and radiology data.  Preventive health is updated, specifically  Cancer screening and Immunization.   Questions or concerns regarding consultations or procedures which the PT has had in the interim are  addressed. The PT denies any adverse reactions to current medications since the last visit.  C/o bilateral hand pain awakening her , shakes hands for comfort x 1 month Denies polyuria, polydipsia, blurred vision , or hypoglycemic episodes. Knee pain not as severe will use NSAID and work on weight loss  ROS Denies recent fever or chills. Denies sinus pressure, nasal congestion, ear pain or sore throat. Denies chest congestion, productive cough or wheezing. Denies chest pains, palpitations and leg swelling Denies abdominal pain, nausea, vomiting,diarrhea or constipation.   Denies dysuria, frequency, hesitancy or incontinenDenies joint pain, swelling and limitation in mobility. Denies headaches, seizures, numbness, or tingling. Denies depression, anxiety or insomnia. Denies skin break down or rash.   PE  BP 122/82 mmHg  Pulse 82  Resp 16  Ht 5\' 5"  (1.651 m)  Wt 330 lb 1.9 oz (149.741 kg)  BMI 54.93 kg/m2  SpO2 96%  Patient alert and oriented and in no cardiopulmonary distress.  HEENT: No facial asymmetry, EOMI,   oropharynx pink and moist.  Neck supple no JVD, no mass.  Chest: Clear to auscultation bilaterally.  CVS: S1, S2 no murmurs, no S3.Regular rate.  ABD: Soft non tender.   Ext: No edema   MS: decreased ROM knees, adequate in spine and hips Positive tinnels sign and bilateral thenar wasting  Skin: Intact, no ulcerations or rash noted.  Psych:  Good eye contact, normal affect. Memory intact not anxious or depressed appearing.  CNS: CN 2-12 intact, power,  normal throughout.no focal deficits noted.   Assessment & Plan   Essential hypertension Controlled, no change in medication DASH diet and commitment to daily physical activity for a minimum of 30 minutes discussed and encouraged, as a part of hypertension management. The importance of attaining a healthy weight is also discussed.  BP/Weight 07/09/2015 02/24/2015 11/22/2014 11/19/2014 11/18/2014 07/15/2014 03/16/2014  Systolic BP 122 132 150 163 122 124 118  Diastolic BP 82 78 80 78 84 80 66  Wt. (Lbs) 330.12 324 328 328 328 330.4 332  BMI 54.93 53.92 54.58 54.58 54.58 54.98 55.25        Type 2 diabetes mellitus with diabetic nephropathy Improbved Isabella Matthews is reminded of the importance of commitment to daily physical activity for 30 minutes or more, as able and the need to limit carbohydrate intake to 30 to 60 grams per meal to help with blood sugar control.   The need to take medication as prescribed, test blood sugar as directed, and to call between visits if there is a concern that blood sugar is uncontrolled is also discussed.   Isabella Matthews is reminded of the importance of daily foot exam, annual eye examination, and good blood sugar, blood pressure and cholesterol control.  Diabetic Labs Latest Ref Rng 06/28/2015 02/24/2015 11/13/2014 07/10/2014 03/06/2014  HbA1c <5.7 % 7.5(H) 8.3(H) 8.4(H) 7.6(H) 7.6(H)  Microalbumin Not estab mg/dL - - 78.213.7 - -  Micro/Creat Ratio <30 mcg/mg creat - - 52(H) - -  Chol 125 - 200 mg/dL - - 161 096 045  HDL >=40 mg/dL - - 69 72 57  Calc LDL <130 mg/dL - - 84 981(X) 914(N)  Triglycerides <150 mg/dL - - 829 562 130  Creatinine 0.50 - 1.10 mg/dL 8.65 7.84 6.96 2.95 2.84   BP/Weight 07/09/2015 02/24/2015 11/22/2014 11/19/2014 11/18/2014 07/15/2014 03/16/2014  Systolic BP 122 132 150 163 122 124 118  Diastolic BP 82 78 80 78 84 80 66  Wt.  (Lbs) 330.12 324 328 328 328 330.4 332  BMI 54.93 53.92 54.58 54.58 54.58 54.98 55.25   Foot/eye exam completion dates Latest Ref Rng 11/18/2014 04/28/2014  Eye Exam No Retinopathy - No Retinopathy  Foot exam Order - - -  Foot Form Completion - Done -         MORBID OBESITY Deteriorated. Patient re-educated about  the importance of commitment to a  minimum of 150 minutes of exercise per week.  The importance of healthy food choices with portion control discussed. Encouraged to start a food diary, count calories and to consider  joining a support group. Sample diet sheets offered. Goals set by the patient for the next several months.   Weight /BMI 07/09/2015 02/24/2015 11/22/2014  WEIGHT 330 lb 1.9 oz 324 lb 328 lb  HEIGHT     BMI 54.93 kg/m2 53.92 kg/m2 54.58 kg/m2    Current exercise per week 60 mins   Osteoarthritis of both knees Discontinue hydrocodone, lose weight, pt to use naproxen  Carpal tunnel syndrome Right worse than left , history and clinical exam consistent with bilateral carpal tunnel, wrist braces recommended, no interest in ortho eval at this time       Review of Systems     Objective:   Physical Exam        Assessment & Plan:

## 2015-07-09 NOTE — Assessment & Plan Note (Signed)
Deteriorated. Patient re-educated about  the importance of commitment to a  minimum of 150 minutes of exercise per week.  The importance of healthy food choices with portion control discussed. Encouraged to start a food diary, count calories and to consider  joining a support group. Sample diet sheets offered. Goals set by the patient for the next several months.   Weight /BMI 07/09/2015 02/24/2015 11/22/2014  WEIGHT 330 lb 1.9 oz 324 lb 328 lb  HEIGHT 5\' 5"  5\' 5"  5\' 5"   BMI 54.93 kg/m2 53.92 kg/m2 54.58 kg/m2    Current exercise per week 60 mins

## 2015-07-09 NOTE — Assessment & Plan Note (Signed)
Controlled, no change in medication DASH diet and commitment to daily physical activity for a minimum of 30 minutes discussed and encouraged, as a part of hypertension management. The importance of attaining a healthy weight is also discussed.  BP/Weight 07/09/2015 02/24/2015 11/22/2014 11/19/2014 11/18/2014 07/15/2014 03/16/2014  Systolic BP 122 132 150 163 122 124 118  Diastolic BP 82 78 80 78 84 80 66  Wt. (Lbs) 330.12 324 328 328 328 330.4 332  BMI 54.93 53.92 54.58 54.58 54.58 54.98 55.25

## 2015-07-09 NOTE — Assessment & Plan Note (Signed)
Improbved Ms. Isabella Matthews is reminded of the importance of commitment to daily physical activity for 30 minutes or more, as able and the need to limit carbohydrate intake to 30 to 60 grams per meal to help with blood sugar control.   The need to take medication as prescribed, test blood sugar as directed, and to call between visits if there is a concern that blood sugar is uncontrolled is also discussed.   Ms. Isabella Matthews is reminded of the importance of daily foot exam, annual eye examination, and good blood sugar, blood pressure and cholesterol control.  Diabetic Labs Latest Ref Rng 06/28/2015 02/24/2015 11/13/2014 07/10/2014 03/06/2014  HbA1c <5.7 % 7.5(H) 8.3(H) 8.4(H) 7.6(H) 7.6(H)  Microalbumin Not estab mg/dL - - 16.113.7 - -  Micro/Creat Ratio <30 mcg/mg creat - - 52(H) - -  Chol 125 - 200 mg/dL - - 096177 045198 409188  HDL >=81>=46 mg/dL - - 69 72 57  Calc LDL <130 mg/dL - - 84 191(Y102(H) 782(N108(H)  Triglycerides <150 mg/dL - - 562120 130120 865115  Creatinine 0.50 - 1.10 mg/dL 7.840.70 6.960.79 2.950.55 2.840.74 1.320.78   BP/Weight 07/09/2015 02/24/2015 11/22/2014 11/19/2014 11/18/2014 07/15/2014 03/16/2014  Systolic BP 122 132 150 163 122 124 118  Diastolic BP 82 78 80 78 84 80 66  Wt. (Lbs) 330.12 324 328 328 328 330.4 332  BMI 54.93 53.92 54.58 54.58 54.58 54.98 55.25   Foot/eye exam completion dates Latest Ref Rng 11/18/2014 04/28/2014  Eye Exam No Retinopathy - No Retinopathy  Foot exam Order - - -  Foot Form Completion - Done -

## 2015-07-09 NOTE — Patient Instructions (Addendum)
Physical exam Oct 16 or after, call if you need me before Pneumonia 23 vaccine today  Congrats improved blood sugar   Fasting lipid, cmp and EgFr, hBa1C, TSH, vit D microalb Oct 13 or after  Pls do mammogram  You are referred for eye exam New for arthritis is naproxen  Symptoms in hand due to carpal tunnel I will write braces  Please work on good  health habits so that your health will improve. 1. Commitment to daily physical activity for 30 to 60  minutes, if you are able to do this.  2. Commitment to wise food choices. Aim for half of your  food intake to be vegetable and fruit, one quarter starchy foods, and one quarter protein. Try to eat on a regular schedule  3 meals per day, snacking between meals should be limited to vegetables or fruits or small portions of nuts. 64 ounces of water per day is generally recommended, unless you have specific health conditions, like heart failure or kidney failure where you will need to limit fluid intake.  3. Commitment to sufficient and a  good quality of physical and mental rest daily, generally between 6 to 8 hours per day.  WITH PERSISTANCE AND PERSEVERANCE, THE IMPOSSIBLE , BECOMES THE NORM!   Thank you  for choosing [facility]. We consider it a privelige to serve you.  Delivering excellent health care in a caring and  compassionate way is our goal.  Partnering with you,  so that together we can achieve this goal is our strategy.

## 2015-07-09 NOTE — Assessment & Plan Note (Signed)
Discontinue hydrocodone, lose weight, pt to use naproxen

## 2015-07-09 NOTE — Assessment & Plan Note (Signed)
Right worse than left , history and clinical exam consistent with bilateral carpal tunnel, wrist braces recommended, no interest in ortho eval at this time

## 2015-09-07 ENCOUNTER — Other Ambulatory Visit: Payer: Self-pay | Admitting: Family Medicine

## 2015-10-11 ENCOUNTER — Other Ambulatory Visit: Payer: Self-pay | Admitting: Family Medicine

## 2015-10-29 ENCOUNTER — Other Ambulatory Visit: Payer: Self-pay

## 2015-10-29 MED ORDER — DAPAGLIFLOZIN PROPANEDIOL 10 MG PO TABS: 10.0000 mg | ORAL_TABLET | Freq: Every day | ORAL | 3 refills | Status: DC

## 2015-10-29 MED ORDER — GLIMEPIRIDE 4 MG PO TABS
4.0000 mg | ORAL_TABLET | Freq: Two times a day (BID) | ORAL | 1 refills | Status: DC
Start: 2015-10-29 — End: 2016-02-24

## 2015-10-29 MED ORDER — INSULIN DETEMIR 100 UNIT/ML FLEXPEN: 70.0000 [IU] | PEN_INJECTOR | Freq: Every day | SUBCUTANEOUS | 1 refills | Status: DC

## 2015-10-29 MED ORDER — HYDROCHLOROTHIAZIDE 25 MG PO TABS: 25.0000 mg | ORAL_TABLET | Freq: Every day | ORAL | 1 refills | Status: DC

## 2015-10-29 MED ORDER — LOSARTAN POTASSIUM 50 MG PO TABS: 50.0000 mg | ORAL_TABLET | Freq: Every day | ORAL | 1 refills | Status: DC

## 2015-10-29 MED ORDER — SITAGLIPTIN PHOSPHATE 100 MG PO TABS: 100.0000 mg | ORAL_TABLET | Freq: Every day | ORAL | 1 refills | Status: DC

## 2015-10-29 MED ORDER — GLIMEPIRIDE 4 MG PO TABS: 4.0000 mg | ORAL_TABLET | Freq: Two times a day (BID) | ORAL | 1 refills | Status: DC

## 2015-11-05 LAB — COMPLETE METABOLIC PANEL WITH GFR
ALT: 40 U/L — AB (ref 6–29)
AST: 43 U/L — AB (ref 10–35)
Albumin: 4 g/dL (ref 3.6–5.1)
Alkaline Phosphatase: 62 U/L (ref 33–115)
BILIRUBIN TOTAL: 0.6 mg/dL (ref 0.2–1.2)
BUN: 16 mg/dL (ref 7–25)
CHLORIDE: 103 mmol/L (ref 98–110)
CO2: 30 mmol/L (ref 20–31)
CREATININE: 0.82 mg/dL (ref 0.50–1.10)
Calcium: 10.4 mg/dL — ABNORMAL HIGH (ref 8.6–10.2)
GFR, Est African American: >89 mL/min (ref >=60)
GFR, Est Non African American: 86 mL/min (ref >=60)
GLUCOSE: 183 mg/dL — AB (ref 65–99)
Potassium: 4.3 mmol/L (ref 3.5–5.3)
SODIUM: 142 mmol/L (ref 135–146)
TOTAL PROTEIN: 7 g/dL (ref 6.1–8.1)

## 2015-11-05 LAB — TSH: TSH: 1.18 mIU/L

## 2015-11-05 LAB — LIPID PANEL
Cholesterol: 192 mg/dL (ref 125–200)
HDL: 55 mg/dL (ref >=46)
LDL CALC: 116 mg/dL (ref <130)
TRIGLYCERIDES: 107 mg/dL (ref <150)
Total CHOL/HDL Ratio: 3.5 Ratio (ref <=5.0)
VLDL: 21 mg/dL (ref <30)

## 2015-11-05 LAB — HEMOGLOBIN A1C
Hgb A1c MFr Bld: 10.3 % — ABNORMAL HIGH (ref <5.7)
Mean Plasma Glucose: 249 mg/dL

## 2015-11-05 LAB — MICROALBUMIN / CREATININE URINE RATIO
CREATININE, URINE: 56 mg/dL (ref 20–320)
MICROALB UR: 0.5 mg/dL
MICROALB/CREAT RATIO: 9 ug/mg{creat} (ref <30)

## 2015-11-06 LAB — VITAMIN D 25 HYDROXY (VIT D DEFICIENCY, FRACTURES): Vit D, 25-Hydroxy: 25 ng/mL — ABNORMAL LOW (ref 30–100)

## 2015-11-11 ENCOUNTER — Encounter: Payer: Self-pay | Admitting: Family Medicine

## 2015-12-20 ENCOUNTER — Encounter: Payer: Self-pay | Admitting: Family Medicine

## 2015-12-20 ENCOUNTER — Other Ambulatory Visit (HOSPITAL_COMMUNITY)
Admission: RE | Admit: 2015-12-20 | Discharge: 2015-12-20 | Disposition: A | Discharge status: Home / Self Care | Payer: Managed Care, Other (non HMO) | Source: Ambulatory Visit | Attending: Family Medicine | Admitting: Family Medicine

## 2015-12-20 ENCOUNTER — Ambulatory Visit (INDEPENDENT_AMBULATORY_CARE_PROVIDER_SITE_OTHER): Payer: Managed Care, Other (non HMO) | Admitting: Family Medicine

## 2015-12-20 VITALS — BP 138/86 | HR 80 | Resp 16 | Ht 65.0 in | Wt 356.0 lb

## 2015-12-20 DIAGNOSIS — Z1211 Encounter for screening for malignant neoplasm of colon: Secondary | ICD-10-CM | POA: Diagnosis not present

## 2015-12-20 DIAGNOSIS — Z01419 Encounter for gynecological examination (general) (routine) without abnormal findings: Secondary | ICD-10-CM | POA: Diagnosis present

## 2015-12-20 DIAGNOSIS — E1121 Type 2 diabetes mellitus with diabetic nephropathy: Secondary | ICD-10-CM | POA: Diagnosis not present

## 2015-12-20 DIAGNOSIS — Z124 Encounter for screening for malignant neoplasm of cervix: Secondary | ICD-10-CM

## 2015-12-20 DIAGNOSIS — Z1239 Encounter for other screening for malignant neoplasm of breast: Secondary | ICD-10-CM

## 2015-12-20 DIAGNOSIS — Z1151 Encounter for screening for human papillomavirus (HPV): Secondary | ICD-10-CM | POA: Diagnosis present

## 2015-12-20 DIAGNOSIS — E785 Hyperlipidemia, unspecified: Secondary | ICD-10-CM | POA: Diagnosis not present

## 2015-12-20 DIAGNOSIS — Z0000 Encounter for general adult medical examination without abnormal findings: Secondary | ICD-10-CM | POA: Diagnosis not present

## 2015-12-20 DIAGNOSIS — I10 Essential (primary) hypertension: Secondary | ICD-10-CM | POA: Diagnosis not present

## 2015-12-20 DIAGNOSIS — Z1231 Encounter for screening mammogram for malignant neoplasm of breast: Secondary | ICD-10-CM

## 2015-12-20 DIAGNOSIS — Z125 Encounter for screening for malignant neoplasm of prostate: Secondary | ICD-10-CM

## 2015-12-20 DIAGNOSIS — Z794 Long term (current) use of insulin: Secondary | ICD-10-CM

## 2015-12-20 LAB — POC HEMOCCULT BLD/STL (OFFICE/1-CARD/DIAGNOSTIC): Fecal Occult Blood, POC: POSITIVE — AB

## 2015-12-20 MED ORDER — PRAVASTATIN SODIUM 20 MG PO TABS: 20.0000 mg | ORAL_TABLET | Freq: Every day | ORAL | 4 refills | Status: DC

## 2015-12-20 NOTE — Assessment & Plan Note (Signed)

## 2015-12-20 NOTE — Assessment & Plan Note (Signed)
Hyperlipidemia:Low fat diet discussed and encouraged.   Lipid Panel  Lab Results  Component Value Date   CHOL 192 11/05/2015   HDL 55 11/05/2015   LDLCALC 116 11/05/2015   TRIG 107 11/05/2015   CHOLHDL 3.5 11/05/2015   Start pravachol rept in 3 months

## 2015-12-20 NOTE — Assessment & Plan Note (Signed)
Deteriorated , needs education  rept lab in 3 months, inc dose levemir Ms. Isabella Matthews is reminded of the importance of commitment to daily physical activity for 30 minutes or more, as able and the need to limit carbohydrate intake to 30 to 60 grams per meal to help with blood sugar control.   The need to take medication as prescribed, test blood sugar as directed, and to call between visits if there is a concern that blood sugar is uncontrolled is also discussed.   Ms. Isabella Matthews is reminded of the importance of daily foot exam, annual eye examination, and good blood sugar, blood pressure and cholesterol control.  Diabetic Labs Latest Ref Rng & Units 11/05/2015 06/28/2015 02/24/2015 11/13/2014 07/10/2014  HbA1c <5.7 % 10.3(H) 7.5(H) 8.3(H) 8.4(H) 7.6(H)  Microalbumin Not estab mg/dL 0.5 - - 82.913.7 -  Micro/Creat Ratio <30 mcg/mg creat 9 - - 52(H) -  Chol 125 - 200 mg/dL 562192 - - 130177 865198  HDL >=78>=46 mg/dL 55 - - 69 72  Calc LDL <130 mg/dL 469116 - - 84 629(B102(H)  Triglycerides <150 mg/dL 284107 - - 132120 440120  Creatinine 0.50 - 1.10 mg/dL 1.020.82 7.250.70 3.660.79 4.400.55 3.470.74   BP/Weight 12/20/2015 07/09/2015 02/24/2015 11/22/2014 11/19/2014 11/18/2014 07/15/2014  Systolic BP 138 122 132 150 163 122 124  Diastolic BP 86 82 78 80 78 84 80  Wt. (Lbs) 356 330.12 324 328 328 328 330.4  BMI 59.24 54.93 53.92 54.58 54.58 54.58 54.98   Foot/eye exam completion dates Latest Ref Rng & Units 12/20/2015 11/18/2014  Eye Exam No Retinopathy - -  Foot exam Order - - -  Foot Form Completion - Done Done

## 2015-12-20 NOTE — Patient Instructions (Addendum)
F/u in 3 months, call if you need me before  You are referred to [place], neeed tol follow diabetic diet  LEvMIR 70 units daily, call in 3 weeks if blood sugar still high, will increase levemir dose Goal for fasting blood sugar ranges from 80 to 120 and 2 hours after any meal or at bedtime should be between 130 to 170.  Record values, book and papers will be given  Mammogram pm appt w to be scheduled per your request  It is important that you exercise regularly at least 30 minutes 5 times a week. If you develop chest pain, have severe difficulty breathing, or feel very tired, stop exercising immediately and seek medical attention    New for cholesterol is pravachol 20 mg daily, also reduce fatty and fried foods  Fasting lipid, cmp and EGFR, HBA1C in 3 month

## 2015-12-23 LAB — CYTOLOGY - PAP
DIAGNOSIS: NEGATIVE
HPV: NOT DETECTED

## 2015-12-24 NOTE — Progress Notes (Signed)
Isabella Matthews     MRN: [id number]      DOB: 1970/07/01  HPI: Patient is in for annual physical exam. Uncontrolled diabetes and hyperlipidemia are discussed at visit also and medication management also done Recent labs, if available are reviewed. Immunization is reviewed , and  updated if needed.   PE:  BP 138/86   Pulse 80   Resp 16   Ht 5\' 5"  (1.651 m)   Wt (!) 356 lb (161.5 kg)   SpO2 96%   BMI 59.24 kg/m   Pleasant  female, alert and oriented x 3, in no cardio-pulmonary distress. Afebrile. HEENT No facial trauma or asymetry. Sinuses non tender.  Extra occullar muscles intact, pupils equally reactive to light. External ears normal, tympanic membranes clear. Oropharynx moist, no exudate. Neck: supple, no adenopathy,JVD or thyromegaly.No bruits.  Chest: Clear to ascultation bilaterally.No crackles or wheezes. Non tender to palpation  Breast: No asymetry,no masses or lumps. No tenderness. No nipple discharge or inversion. No axillary or supraclavicular adenopathy  Cardiovascular system; Heart sounds normal,  S1 and  S2 ,no S3.  No murmur, or thrill. Apical beat not displaced Peripheral pulses normal.  Abdomen: Soft, non tender, no organomegaly or masses. No bruits. Bowel sounds normal. No guarding, tenderness or rebound.  Rectal:  Normal sphincter tone. No rectal mass. Guaiac negative stool.  GU: External genitalia normal female genitalia , normal female distribution of hair. No lesions. Urethral meatus normal in size, no  Prolapse, no lesions visibly  Present. Bladder non tender. Vagina pink and moist , with no visible lesions , discharge present . Adequate pelvic support no  cystocele or rectocele noted Cervix pink and appears healthy, no lesions or ulcerations noted, no discharge noted from os Uterus normal size, no adnexal masses, no cervical motion or adnexal tenderness.   Musculoskeletal exam: Full ROM of spine, hips , shoulders and  knees. No deformity ,swelling or crepitus noted. No muscle wasting or atrophy.   Neurologic: Cranial nerves 2 to 12 intact. Power, tone ,sensation and reflexes normal throughout. No disturbance in gait. No tremor.  Skin: Intact, no ulceration, erythema , scaling or rash noted. Pigmentation normal throughout  Psych; Normal mood and affect. Judgement and concentration normal   Assessment & Plan:  Annual physical exam Annual exam as documented. Counseling done  re healthy lifestyle involving commitment to 150 minutes exercise per week, heart healthy diet, and attaining healthy weight.The importance of adequate sleep also discussed. Regular seat belt use and home safety, is also discussed. Changes in health habits are decided on by the patient with goals and time frames  set for achieving them. Immunization and cancer screening needs are specifically addressed at this visit.   Type 2 diabetes mellitus with diabetic nephropathy Deteriorated , needs education  rept lab in 3 months, inc dose levemir Isabella Matthews is reminded of the importance of commitment to daily physical activity for 30 minutes or more, as able and the need to limit carbohydrate intake to 30 to 60 grams per meal to help with blood sugar control.   The need to take medication as prescribed, test blood sugar as directed, and to call between visits if there is a concern that blood sugar is uncontrolled is also discussed.   Isabella Matthews is reminded of the importance of daily foot exam, annual eye examination, and good blood sugar, blood pressure and cholesterol control.  Diabetic Labs Latest Ref Rng & Units 11/05/2015 06/28/2015 02/24/2015 11/13/2014 07/10/2014  HbA1c <5.7 %  10.3(H) 7.5(H) 8.3(H) 8.4(H) 7.6(H)  Microalbumin Not estab mg/dL 0.5 - - 16.113.7 -  Micro/Creat Ratio <30 mcg/mg creat 9 - - 52(H) -  Chol 125 - 200 mg/dL 096192 - - 045177 409198  HDL >=81>=46 mg/dL 55 - - 69 72  Calc LDL <130 mg/dL 191116 - - 84 478(G102(H)   Triglycerides <150 mg/dL 956107 - - 213120 086120  Creatinine 0.50 - 1.10 mg/dL 5.780.82 4.690.70 6.290.79 5.280.55 4.130.74   BP/Weight 12/20/2015 07/09/2015 02/24/2015 11/22/2014 11/19/2014 11/18/2014 07/15/2014  Systolic BP 138 122 132 150 163 122 124  Diastolic BP 86 82 78 80 78 84 80  Wt. (Lbs) 356 330.12 324 328 328 328 330.4  BMI 59.24 54.93 53.92 54.58 54.58 54.58 54.98   Foot/eye exam completion dates Latest Ref Rng & Units 12/20/2015 11/18/2014  Eye Exam No Retinopathy - -  Foot exam Order - - -  Foot Form Completion - Done Done        Hyperlipidemia LDL goal <100 Hyperlipidemia:Low fat diet discussed and encouraged.   Lipid Panel  Lab Results  Component Value Date   CHOL 192 11/05/2015   HDL 55 11/05/2015   LDLCALC 116 11/05/2015   TRIG 107 11/05/2015   CHOLHDL 3.5 11/05/2015   Start pravachol rept in 3 months

## 2015-12-30 ENCOUNTER — Encounter: Payer: Self-pay | Admitting: Family Medicine

## 2016-02-22 ENCOUNTER — Telehealth: Payer: Self-pay | Admitting: Family Medicine

## 2016-02-22 NOTE — Telephone Encounter (Signed)
Needs refills on all her Rx's 90 day supply sent to new pharmacy [facility], please advise?

## 2016-02-24 ENCOUNTER — Other Ambulatory Visit: Payer: Self-pay

## 2016-02-24 DIAGNOSIS — M170 Bilateral primary osteoarthritis of knee: Secondary | ICD-10-CM

## 2016-02-24 MED ORDER — HYDROCHLOROTHIAZIDE 25 MG PO TABS: 25.0000 mg | ORAL_TABLET | Freq: Every day | ORAL | 1 refills | Status: DC

## 2016-02-24 MED ORDER — DAPAGLIFLOZIN PROPANEDIOL 10 MG PO TABS: 10.0000 mg | ORAL_TABLET | Freq: Every day | ORAL | 1 refills | Status: DC

## 2016-02-24 MED ORDER — NAPROXEN 375 MG PO TABS: 375.0000 mg | ORAL_TABLET | Freq: Two times a day (BID) | ORAL | 0 refills | Status: DC

## 2016-02-24 MED ORDER — GLIMEPIRIDE 4 MG PO TABS: 4.0000 mg | ORAL_TABLET | Freq: Two times a day (BID) | ORAL | 1 refills | Status: DC

## 2016-02-24 MED ORDER — PRAVASTATIN SODIUM 20 MG PO TABS: 20.0000 mg | ORAL_TABLET | Freq: Every day | ORAL | 1 refills | Status: DC

## 2016-02-24 MED ORDER — LOSARTAN POTASSIUM 50 MG PO TABS: 50.0000 mg | ORAL_TABLET | Freq: Every day | ORAL | 1 refills | Status: DC

## 2016-02-24 MED ORDER — SITAGLIPTIN PHOSPHATE 100 MG PO TABS: 100.0000 mg | ORAL_TABLET | Freq: Every day | ORAL | 1 refills | Status: DC

## 2016-02-24 NOTE — Telephone Encounter (Signed)
meds refilled as requested.

## 2016-03-16 LAB — LIPID PANEL
Cholesterol: 173 mg/dL
HDL: 77 mg/dL
LDL Cholesterol: 74 mg/dL
Total CHOL/HDL Ratio: 2.2 ratio
Triglycerides: 109 mg/dL
VLDL: 22 mg/dL

## 2016-03-16 LAB — COMPLETE METABOLIC PANEL WITHOUT GFR
ALT: 17 U/L (ref 6–29)
AST: 18 U/L (ref 10–35)
Albumin: 3.9 g/dL (ref 3.6–5.1)
Alkaline Phosphatase: 52 U/L (ref 33–115)
BUN: 16 mg/dL (ref 7–25)
CO2: 27 mmol/L (ref 20–31)
Calcium: 9.3 mg/dL (ref 8.6–10.2)
Chloride: 103 mmol/L (ref 98–110)
Creat: 0.76 mg/dL (ref 0.50–1.10)
GFR, Est African American: >89 mL/min
GFR, Est Non African American: >89 mL/min
Glucose, Bld: 127 mg/dL — ABNORMAL HIGH (ref 65–99)
Potassium: 4.2 mmol/L (ref 3.5–5.3)
Sodium: 141 mmol/L (ref 135–146)
Total Bilirubin: 0.4 mg/dL (ref 0.2–1.2)
Total Protein: 7.2 g/dL (ref 6.1–8.1)

## 2016-03-17 LAB — HEMOGLOBIN A1C
Hgb A1c MFr Bld: 7.4 % — ABNORMAL HIGH (ref <5.7)
MEAN PLASMA GLUCOSE: 166 mg/dL

## 2016-03-20 ENCOUNTER — Ambulatory Visit (INDEPENDENT_AMBULATORY_CARE_PROVIDER_SITE_OTHER): Payer: Managed Care, Other (non HMO) | Admitting: Family Medicine

## 2016-03-20 ENCOUNTER — Other Ambulatory Visit: Payer: Self-pay | Admitting: Family Medicine

## 2016-03-20 ENCOUNTER — Encounter: Payer: Self-pay | Admitting: Family Medicine

## 2016-03-20 VITALS — BP 130/84 | HR 83 | Resp 15 | Ht 65.0 in | Wt 348.0 lb

## 2016-03-20 DIAGNOSIS — I10 Essential (primary) hypertension: Secondary | ICD-10-CM | POA: Diagnosis not present

## 2016-03-20 DIAGNOSIS — E785 Hyperlipidemia, unspecified: Secondary | ICD-10-CM

## 2016-03-20 DIAGNOSIS — Z794 Long term (current) use of insulin: Secondary | ICD-10-CM

## 2016-03-20 DIAGNOSIS — G5603 Carpal tunnel syndrome, bilateral upper limbs: Secondary | ICD-10-CM | POA: Diagnosis not present

## 2016-03-20 DIAGNOSIS — E1121 Type 2 diabetes mellitus with diabetic nephropathy: Secondary | ICD-10-CM | POA: Diagnosis not present

## 2016-03-20 DIAGNOSIS — Z1231 Encounter for screening mammogram for malignant neoplasm of breast: Secondary | ICD-10-CM

## 2016-03-20 MED ORDER — GABAPENTIN 300 MG PO CAPS: 300.0000 mg | ORAL_CAPSULE | Freq: Every day | ORAL | 1 refills | Status: DC

## 2016-03-20 MED ORDER — INSULIN DETEMIR 100 UNIT/ML FLEXPEN: 70.0000 [IU] | PEN_INJECTOR | Freq: Every day | SUBCUTANEOUS | 1 refills | Status: DC

## 2016-03-20 NOTE — Assessment & Plan Note (Signed)
Improved Isabella Matthews is reminded of the importance of commitment to daily physical activity for 30 minutes or more, as able and the need to limit carbohydrate intake to 30 to 60 grams per meal to help with blood sugar control.   The need to take medication as prescribed, test blood sugar as directed, and to call between visits if there is a concern that blood sugar is uncontrolled is also discussed.   Isabella Matthews is reminded of the importance of daily foot exam, annual eye examination, and good blood sugar, blood pressure and cholesterol control.  Diabetic Labs Latest Ref Rng & Units 03/16/2016 11/05/2015 06/28/2015 02/24/2015 11/13/2014  HbA1c <5.7 % 7.4(H) 10.3(H) 7.5(H) 8.3(H) 8.4(H)  Microalbumin Not estab mg/dL - 0.5 - - 40.913.7  Micro/Creat Ratio <30 mcg/mg creat - 9 - - 52(H)  Chol <200 mg/dL 811173 914192 - - 782177  HDL >95>50 mg/dL 77 55 - - 69  Calc LDL <100 mg/dL 74 621116 - - 84  Triglycerides <150 mg/dL 308109 657107 - - 846120  Creatinine 0.50 - 1.10 mg/dL 9.620.76 9.520.82 8.410.70 3.240.79 4.010.55   BP/Weight 03/20/2016 12/20/2015 07/09/2015 02/24/2015 11/22/2014 11/19/2014 11/18/2014  Systolic BP 130 138 122 132 150 163 122  Diastolic BP 84 86 82 78 80 78 84  Wt. (Lbs) 348 356 330.12 324 328 328 328  BMI 57.91 59.24 54.93 53.92 54.58 54.58 54.58   Foot/eye exam completion dates Latest Ref Rng & Units 12/20/2015 11/18/2014  Eye Exam No Retinopathy - -  Foot exam Order - - -  Foot Form Completion - Done Done      Updated lab needed at/ before next visit.

## 2016-03-20 NOTE — Progress Notes (Signed)
CHE BELOW     MRN: [id number]      DOB: 30-Jan-1970   HPI Isabella Matthews is here for follow up and re-evaluation of chronic medical conditions, medication management and review of any available recent lab and radiology data.  Preventive health is updated, specifically  Cancer screening and Immunization.   Questions or concerns regarding consultations or procedures which the PT has had in the interim are  addressed. The PT denies any adverse reactions to current medications since the last visit.  There are no new concerns.  There are no specific complaints  Denies polyuria, polydipsia, blurred vision , or hypoglycemic episodes.   ROS Denies recent fever or chills. Denies sinus pressure, nasal congestion, ear pain or sore throat. Denies chest congestion, productive cough or wheezing. Denies chest pains, palpitations and leg swelling Denies abdominal pain, nausea, vomiting,diarrhea or constipation.   Denies dysuria, frequency, hesitancy or incontinence. Denies joint pain, swelling and limitation in mobility. Denies headaches, seizures, numbness, or tingling. Denies depression, anxiety or insomnia. Denies skin break down or rash.   PE  BP 130/84   Pulse 83   Resp 15   Ht 5\' 5"  (1.651 m)   Wt (!) 348 lb (157.9 kg)   SpO2 96%   BMI 57.91 kg/m   Patient alert and oriented and in no cardiopulmonary distress.  HEENT: No facial asymmetry, EOMI,   oropharynx pink and moist.  Neck supple no JVD, no mass.  Chest: Clear to auscultation bilaterally.  CVS: S1, S2 no murmurs, no S3.Regular rate.  ABD: Soft non tender.   Ext: No edema  MS: Adequate ROM spine, shoulders, hips and knees.  Skin: Intact, no ulcerations or rash noted.  Psych: Good eye contact, normal affect. Memory intact not anxious or depressed appearing.  CNS: CN 2-12 intact, power,  normal throughout.no focal deficits noted.   Assessment & Plan  Essential hypertension Controlled, no change in  medication DASH diet and commitment to daily physical activity for a minimum of 30 minutes discussed and encouraged, as a part of hypertension management. The importance of attaining a healthy weight is also discussed.  BP/Weight 03/20/2016 12/20/2015 07/09/2015 02/24/2015 11/22/2014 11/19/2014 11/18/2014  Systolic BP 130 138 122 132 150 163 122  Diastolic BP 84 86 82 78 80 78 84  Wt. (Lbs) 348 356 330.12 324 328 328 328  BMI 57.91 59.24 54.93 53.92 54.58 54.58 54.58       Type 2 diabetes mellitus with diabetic nephropathy Improved Isabella Matthews is reminded of the importance of commitment to daily physical activity for 30 minutes or more, as able and the need to limit carbohydrate intake to 30 to 60 grams per meal to help with blood sugar control.   The need to take medication as prescribed, test blood sugar as directed, and to call between visits if there is a concern that blood sugar is uncontrolled is also discussed.   Isabella Matthews is reminded of the importance of daily foot exam, annual eye examination, and good blood sugar, blood pressure and cholesterol control.  Diabetic Labs Latest Ref Rng & Units 03/16/2016 11/05/2015 06/28/2015 02/24/2015 11/13/2014  HbA1c <5.7 % 7.4(H) 10.3(H) 7.5(H) 8.3(H) 8.4(H)  Microalbumin Not estab mg/dL - 0.5 - - 78.2  Micro/Creat Ratio <30 mcg/mg creat - 9 - - 52(H)  Chol <200 mg/dL 956 213 - - 086  HDL >57 mg/dL 77 55 - - 69  Calc LDL <100 mg/dL 74 846 - - 84  Triglycerides <150 mg/dL  109 107 - - 120  Creatinine 0.50 - 1.10 mg/dL 9.600.76 4.540.82 0.980.70 1.190.79 1.470.55   BP/Weight 03/20/2016 12/20/2015 07/09/2015 02/24/2015 11/22/2014 11/19/2014 11/18/2014  Systolic BP 130 138 122 132 150 163 122  Diastolic BP 84 86 82 78 80 78 84  Wt. (Lbs) 348 356 330.12 324 328 328 328  BMI 57.91 59.24 54.93 53.92 54.58 54.58 54.58   Foot/eye exam completion dates Latest Ref Rng & Units 12/20/2015 11/18/2014  Eye Exam No Retinopathy - -  Foot exam Order - - -  Foot Form  Completion - Done Done      Updated lab needed at/ before next visit.   MORBID OBESITY Improved. Patient re-educated about  the importance of commitment to a  minimum of 150 minutes of exercise per week.  The importance of healthy food choices with portion control discussed. Encouraged to start a food diary, count calories and to consider  joining a support group. Sample diet sheets offered. Goals set by the patient for the next several months.   Weight /BMI 03/20/2016 12/20/2015 07/09/2015  WEIGHT 348 lb 356 lb 330 lb 1.9 oz  HEIGHT 5\' 5"  5\' 5"  5\' 5"   BMI 57.91 kg/m2 59.24 kg/m2 54.93 kg/m2      Hyperlipidemia LDL goal <100 Controlled, no change in medication Hyperlipidemia:Low fat diet discussed and encouraged.   Lipid Panel  Lab Results  Component Value Date   CHOL 173 03/16/2016   HDL 77 03/16/2016   LDLCALC 74 03/16/2016   TRIG 109 03/16/2016   CHOLHDL 2.2 03/16/2016       Carpal tunnel syndrome Painful at night, start gabapentin at bedtime

## 2016-03-20 NOTE — Patient Instructions (Addendum)
F/u in2nfd week in june,  Call if you need me before   CONGRATS on improved blood sugar, and weight loss , keep it up.  30 minute dance off to be started every day with Mom!  HBa1C, chem 7 and eGFR and cBC June 6 or shortly after  Weight loss goal of 10 pounds  Gabapentin sent in at bedtime fopr pain due to carpal tunnel  Please work on good  health habits so that your health will improve. 1. Commitment to daily physical activity for 30 to 60  minutes, if you are able to do this.  2. Commitment to wise food choices. Aim for half of your  food intake to be vegetable and fruit, one quarter starchy foods, and one quarter protein. Try to eat on a regular schedule  3 meals per day, snacking between meals should be limited to vegetables or fruits or small portions of nuts. 64 ounces of water per day is generally recommended, unless you have specific health conditions, like heart failure or kidney failure where you will need to limit fluid intake.  3. Commitment to sufficient and a  good quality of physical and mental rest daily, generally between 6 to 8 hours per day.  WITH PERSISTANCE AND PERSEVERANCE, THE IMPOSSIBLE , BECOMES THE NORM!   Thank you  for choosing [facility]. We consider it a privelige to serve you.  Delivering excellent health care in a caring and  compassionate way is our goal.  Partnering with you,  so that together we can achieve this goal is our strategy.

## 2016-03-20 NOTE — Assessment & Plan Note (Signed)
Painful at night, start gabapentin at bedtime

## 2016-03-20 NOTE — Assessment & Plan Note (Addendum)
Improved. Patient re-educated about  the importance of commitment to a  minimum of 150 minutes of exercise per week.  The importance of healthy food choices with portion control discussed. Encouraged to start a food diary, count calories and to consider  joining a support group. Sample diet sheets offered. Goals set by the patient for the next several months.   Weight /BMI 03/20/2016 12/20/2015 07/09/2015  WEIGHT 348 lb 356 lb 330 lb 1.9 oz  HEIGHT 5\' 5"  5\' 5"  5\' 5"   BMI 57.91 kg/m2 59.24 kg/m2 54.93 kg/m2

## 2016-03-20 NOTE — Assessment & Plan Note (Signed)
Controlled, no change in medication Hyperlipidemia:Low fat diet discussed and encouraged.   Lipid Panel  Lab Results  Component Value Date   CHOL 173 03/16/2016   HDL 77 03/16/2016   LDLCALC 74 03/16/2016   TRIG 109 03/16/2016   CHOLHDL 2.2 03/16/2016

## 2016-03-20 NOTE — Assessment & Plan Note (Signed)
Controlled, no change in medication DASH diet and commitment to daily physical activity for a minimum of 30 minutes discussed and encouraged, as a part of hypertension management. The importance of attaining a healthy weight is also discussed.  BP/Weight 03/20/2016 12/20/2015 07/09/2015 02/24/2015 11/22/2014 11/19/2014 11/18/2014  Systolic BP 130 138 122 132 150 163 122  Diastolic BP 84 86 82 78 80 78 84  Wt. (Lbs) 348 356 330.12 324 328 328 328  BMI 57.91 59.24 54.93 53.92 54.58 54.58 54.58

## 2016-04-03 ENCOUNTER — Ambulatory Visit (HOSPITAL_COMMUNITY): Payer: Managed Care, Other (non HMO)

## 2016-04-24 ENCOUNTER — Ambulatory Visit (HOSPITAL_COMMUNITY): Payer: Managed Care, Other (non HMO)

## 2016-06-28 LAB — BASIC METABOLIC PANEL WITH GFR
BUN: 16 mg/dL (ref 7–25)
CHLORIDE: 102 mmol/L (ref 98–110)
CO2: 21 mmol/L (ref 20–31)
CREATININE: 0.99 mg/dL (ref 0.50–1.10)
Calcium: 9.6 mg/dL (ref 8.6–10.2)
GFR, Est African American: 78 mL/min (ref >=60)
GFR, Est Non African American: 68 mL/min (ref >=60)
GLUCOSE: 161 mg/dL — AB (ref 65–99)
Potassium: 4.2 mmol/L (ref 3.5–5.3)
Sodium: 141 mmol/L (ref 135–146)

## 2016-06-28 LAB — CBC
HEMATOCRIT: 40.2 % (ref 35.0–45.0)
Hemoglobin: 13.5 g/dL (ref 11.7–15.5)
MCH: 31.3 pg (ref 27.0–33.0)
MCHC: 33.6 g/dL (ref 32.0–36.0)
MCV: 93.1 fL (ref 80.0–100.0)
MPV: 10 fL (ref 7.5–12.5)
Platelets: 229 10*3/uL (ref 140–400)
RBC: 4.32 MIL/uL (ref 3.80–5.10)
RDW: 13.9 % (ref 11.0–15.0)
WBC: 6.3 10*3/uL (ref 3.8–10.8)

## 2016-06-28 LAB — HEMOGLOBIN A1C
HEMOGLOBIN A1C: 8.2 % — AB (ref <5.7)
MEAN PLASMA GLUCOSE: 189 mg/dL

## 2016-06-29 ENCOUNTER — Encounter (HOSPITAL_COMMUNITY): Payer: Self-pay | Admitting: Emergency Medicine

## 2016-06-29 ENCOUNTER — Emergency Department (HOSPITAL_COMMUNITY)
Admission: EM | Admit: 2016-06-29 | Discharge: 2016-06-29 | Disposition: A | Discharge status: Home Health | Payer: PRIVATE HEALTH INSURANCE | Attending: Emergency Medicine | Admitting: Emergency Medicine

## 2016-06-29 DIAGNOSIS — Z87891 Personal history of nicotine dependence: Secondary | ICD-10-CM | POA: Diagnosis not present

## 2016-06-29 DIAGNOSIS — I10 Essential (primary) hypertension: Secondary | ICD-10-CM | POA: Diagnosis not present

## 2016-06-29 DIAGNOSIS — Z794 Long term (current) use of insulin: Secondary | ICD-10-CM | POA: Diagnosis not present

## 2016-06-29 DIAGNOSIS — K0889 Other specified disorders of teeth and supporting structures: Secondary | ICD-10-CM

## 2016-06-29 DIAGNOSIS — Z79899 Other long term (current) drug therapy: Secondary | ICD-10-CM | POA: Diagnosis not present

## 2016-06-29 DIAGNOSIS — K029 Dental caries, unspecified: Secondary | ICD-10-CM | POA: Diagnosis not present

## 2016-06-29 DIAGNOSIS — E119 Type 2 diabetes mellitus without complications: Secondary | ICD-10-CM | POA: Insufficient documentation

## 2016-06-29 MED ORDER — DICLOFENAC SODIUM 75 MG PO TBEC: 75.0000 mg | DELAYED_RELEASE_TABLET | Freq: Two times a day (BID) | ORAL | 0 refills | Status: DC

## 2016-06-29 MED ORDER — PENICILLIN V POTASSIUM 500 MG PO TABS: 500.0000 mg | ORAL_TABLET | Freq: Three times a day (TID) | ORAL | 0 refills | Status: DC

## 2016-06-29 NOTE — ED Triage Notes (Signed)
Patient complaining of upper right dental pain x 2 weeks, worsening x 5 days.

## 2016-06-29 NOTE — ED Provider Notes (Signed)
[facility] DEPT Provider Note   CSN: [id number] Arrival date & time: 06/29/16  1528     History   Chief Complaint Chief Complaint  Patient presents with  . Dental Pain    HPI Isabella Matthews is a 46 y.o. female.  HPI  Isabella Matthews is a 46 y.o. female who presents to the Emergency Department complaining of right upper dental pain for two weeks.  She describes a throbbing sensation to her upper teeth that is worse with cold or hot foods or liquids.  She has tried Oragel without relief.   She has appt with a dentist for next week.  She denies facial swelling, fever, neck pain and difficulty swallowing   Past Medical History:  Diagnosis Date  . Aortic stenosis, mild   . Aortic valve disorders   . Diabetes mellitus   . Edema   . Essential hypertension   . Morbid obesity (HCC)   . Onychomycosis of toenail     Patient Active Problem List   Diagnosis Date Noted  . Carpal tunnel syndrome 07/09/2015  . Osteoarthritis of both knees 11/21/2014  . Allergic rhinitis 07/12/2012  . Hyperlipidemia LDL goal <100 05/06/2009  . Dermatomycosis 04/05/2009  . SLEEP APNEA 04/05/2009  . HEMORRHOIDS 08/22/2007  . PROTEINURIA 08/22/2007  . Essential hypertension 04/24/2006  . ONYCHOMYCOSIS, TOENAILS 03/05/2006  . Type 2 diabetes mellitus with diabetic nephropathy (HCC) 03/05/2006  . MORBID OBESITY 01/02/2006    Past Surgical History:  Procedure Laterality Date  . CESAREAN SECTION     x2    OB History    No data available       Home Medications    Prior to Admission medications   Medication Sig Start Date End Date Taking? Authorizing Provider  dapagliflozin propanediol (FARXIGA) 10 MG TABS tablet Take 10 mg by mouth daily. 02/24/16   Kerri PerchesSimpson, Margaret E, MD  ferrous sulfate (SLOW RELEASE IRON) 160 (50 FE) MG TBCR Take 1 tablet by mouth daily.     [provider]  gabapentin (NEURONTIN) 300 MG capsule Take 1 capsule (300 mg total) by mouth at bedtime. 03/20/16    Kerri PerchesSimpson, Margaret E, MD  glimepiride (AMARYL) 4 MG tablet Take 1 tablet (4 mg total) by mouth 2 (two) times daily. 02/24/16   Kerri PerchesSimpson, Margaret E, MD  hydrochlorothiazide (HYDRODIURIL) 25 MG tablet Take 1 tablet (25 mg total) by mouth daily. 02/24/16   Kerri PerchesSimpson, Margaret E, MD  Insulin Detemir (LEVEMIR) 100 UNIT/ML Pen Inject 70 Units into the skin daily at 10 pm. 03/20/16   Kerri PerchesSimpson, Margaret E, MD  losartan (COZAAR) 50 MG tablet Take 1 tablet (50 mg total) by mouth daily. 02/24/16   Kerri PerchesSimpson, Margaret E, MD  naproxen (NAPROSYN) 375 MG tablet Take 1 tablet (375 mg total) by mouth 2 (two) times daily with a meal. 02/24/16   Kerri PerchesSimpson, Margaret E, MD  nystatin (MYCOSTATIN) powder APPLY POWDER TOPICALLY TO AFFECTED AREAS THREE TIMES DAILY 03/18/15   Kerri PerchesSimpson, Margaret E, MD  pravastatin (PRAVACHOL) 20 MG tablet Take 1 tablet (20 mg total) by mouth daily. 02/24/16   Kerri PerchesSimpson, Margaret E, MD  sitaGLIPtin (JANUVIA) 100 MG tablet Take 1 tablet (100 mg total) by mouth daily. 02/24/16   Kerri PerchesSimpson, Margaret E, MD    Family History Family History  Problem Relation Age of Onset  . Hypertension Mother   . Hypertension Father   . Diabetes Father     Social History Social History  Substance Use Topics  . Smoking status:  Former Smoker    Packs/day: 0.50    Types: Cigarettes    Quit date: 11/23/2014  . Smokeless tobacco: Never Used  . Alcohol use 0.0 oz/week     Comment: occasionally     Allergies   Ace inhibitors; Metformin and related; and Tramadol   Review of Systems Review of Systems  Constitutional: Negative for appetite change and fever.  HENT: Positive for dental problem. Negative for congestion, facial swelling, sore throat and trouble swallowing.   Eyes: Negative for pain and visual disturbance.  Respiratory: Negative for shortness of breath.   Cardiovascular: Negative for chest pain.  Musculoskeletal: Negative for neck pain and neck stiffness.  Neurological: Negative for dizziness, facial asymmetry  and headaches.  Hematological: Negative for adenopathy.  All other systems reviewed and are negative.    Physical Exam Updated Vital Signs BP (!) 146/73 (BP Location: Right Arm)   Pulse 97   Temp 98.1 F (36.7 C) (Oral)   Resp (!) 22   Ht 5\' 3"  (1.6 m)   Wt (!) 149.7 kg (330 lb)   SpO2 98%   BMI 58.46 kg/m   Physical Exam  Constitutional: She is oriented to person, place, and time. She appears well-developed and well-nourished. No distress.  HENT:  Head: Normocephalic and atraumatic.  Right Ear: Tympanic membrane and ear canal normal.  Left Ear: Tympanic membrane and ear canal normal.  Mouth/Throat: Uvula is midline, oropharynx is clear and moist and mucous membranes are normal. No trismus in the jaw. Dental caries present. No dental abscesses or uvula swelling.  ttp and dental caries of the right upper second molar.  No facial swelling, obvious dental abscess, trismus, or sublingual abnml.    Neck: Normal range of motion. Neck supple.  Cardiovascular: Normal rate, regular rhythm and normal heart sounds.   No murmur heard. Pulmonary/Chest: Effort normal and breath sounds normal.  Musculoskeletal: Normal range of motion.  Lymphadenopathy:    She has no cervical adenopathy.  Neurological: She is alert and oriented to person, place, and time. She exhibits normal muscle tone. Coordination normal.  Skin: Skin is warm and dry.  Nursing note and vitals reviewed.    ED Treatments / Results  Labs (all labs ordered are listed, but only abnormal results are displayed) Labs Reviewed - No data to display  EKG  EKG Interpretation None       Radiology No results found.  Procedures Procedures (including critical care time)  Medications Ordered in ED Medications - No data to display   Initial Impression / Assessment and Plan / ED Course  I have reviewed the triage vital signs and the nursing notes.  Pertinent labs & imaging results that were available during my care of  the patient were reviewed by me and considered in my medical decision making (see chart for details).     Pt is well appearing, non-toxic.  No concerning sx's for dental abscess or Ludwig's angina.  Pt has appt with dentist next week.     Final Clinical Impressions(s) / ED Diagnoses   Final diagnoses:  Pain, dental    New Prescriptions New Prescriptions   No medications on file     Pauline Aus, PA-C 06/29/16 1638    Lavera Guise, MD 06/30/16 [phone]

## 2016-06-29 NOTE — Discharge Instructions (Signed)
Stop your naprosyn while taking the diclofenac.  Follow-up with your dentist next week

## 2016-07-03 ENCOUNTER — Encounter: Payer: Self-pay | Admitting: Family Medicine

## 2016-07-03 ENCOUNTER — Ambulatory Visit (INDEPENDENT_AMBULATORY_CARE_PROVIDER_SITE_OTHER): Payer: No Typology Code available for payment source | Admitting: Family Medicine

## 2016-07-03 VITALS — BP 124/80 | HR 88 | Temp 98.3°F | Resp 20 | Ht 65.0 in | Wt 344.0 lb

## 2016-07-03 DIAGNOSIS — E1121 Type 2 diabetes mellitus with diabetic nephropathy: Secondary | ICD-10-CM | POA: Diagnosis not present

## 2016-07-03 DIAGNOSIS — I10 Essential (primary) hypertension: Secondary | ICD-10-CM | POA: Diagnosis not present

## 2016-07-03 DIAGNOSIS — Z794 Long term (current) use of insulin: Secondary | ICD-10-CM

## 2016-07-03 DIAGNOSIS — E785 Hyperlipidemia, unspecified: Secondary | ICD-10-CM | POA: Diagnosis not present

## 2016-07-03 NOTE — Assessment & Plan Note (Signed)
Deteriorated needs to commit to testing daily and controlling catrb intake Ms. Isabella Matthews is reminded of the importance of commitment to daily physical activity for 30 minutes or more, as able and the need to limit carbohydrate intake to 30 to 60 grams per meal to help with blood sugar control.   The need to take medication as prescribed, test blood sugar as directed, and to call between visits if there is a concern that blood sugar is uncontrolled is also discussed.   Ms. Isabella Matthews is reminded of the importance of daily foot exam, annual eye examination, and good blood sugar, blood pressure and cholesterol control.  Diabetic Labs Latest Ref Rng & Units 06/27/2016 03/16/2016 11/05/2015 06/28/2015 02/24/2015  HbA1c <5.7 % 8.2(H) 7.4(H) 10.3(H) 7.5(H) 8.3(H)  Microalbumin Not estab mg/dL - - 0.5 - -  Micro/Creat Ratio <30 mcg/mg creat - - 9 - -  Chol <200 mg/dL - 161173 096192 - -  HDL >04>50 mg/dL - 77 55 - -  Calc LDL <540<100 mg/dL - 74 981116 - -  Triglycerides <150 mg/dL - 191109 478107 - -  Creatinine 0.50 - 1.10 mg/dL 2.950.99 6.210.76 3.080.82 6.570.70 8.460.79   BP/Weight 07/03/2016 06/29/2016 03/20/2016 12/20/2015 07/09/2015 02/24/2015 11/22/2014  Systolic BP 124 146 130 138 122 132 150  Diastolic BP 80 73 84 86 82 78 80  Wt. (Lbs) 344.04 330 348 356 330.12 324 328  BMI 57.25 58.46 57.91 59.24 54.93 53.92 54.58   Foot/eye exam completion dates Latest Ref Rng & Units 12/20/2015 11/18/2014  Eye Exam No Retinopathy - -  Foot exam Order - - -  Foot Form Completion - Done Done

## 2016-07-03 NOTE — Assessment & Plan Note (Signed)
Hyperlipidemia:Low fat diet discussed and encouraged.   Lipid Panel  Lab Results  Component Value Date   CHOL 173 03/16/2016   HDL 77 03/16/2016   LDLCALC 74 03/16/2016   TRIG 109 03/16/2016   CHOLHDL 2.2 03/16/2016   Controlled, no change in medication

## 2016-07-03 NOTE — Assessment & Plan Note (Signed)
Deteriorated. Patient re-educated about  the importance of commitment to a  minimum of 150 minutes of exercise per week.  The importance of healthy food choices with portion control discussed. Encouraged to start a food diary, count calories and to consider  joining a support group. Sample diet sheets offered. Goals set by the patient for the next several months.   Weight /BMI 07/03/2016 06/29/2016 03/20/2016  WEIGHT 344 lb 0.6 oz 330 lb 348 lb  HEIGHT 5\' 5"  5\' 3"  5\' 5"   BMI 57.25 kg/m2 58.46 kg/m2 57.91 kg/m2

## 2016-07-03 NOTE — Patient Instructions (Signed)
F/u in 3.5 month, call if you need me sooner  PLEASE take blood sugar seriously, you have at least 50 years to live with this disease,  More  than half your life and you CAN control and conquer it  Test at LEAST once every day, preferably twice  FBG needs to be between 90 to 130   Non fast hBa1C, chem 7 and EGFr  3 days before next visit  Discuss new refill pharmacy with nurse before leaving   Pls get your eye exam and mammogram before you return, both are overdue, you schedule both  It is important that you exercise regularly at least 30 minutes 5 times a week. If you develop chest pain, have severe difficulty breathing, or feel very tired, stop exercising immediately and seek medical attention   Thank you  for choosing [facility]. We consider it a privelige to serve you.  Delivering excellent health care in a caring and  compassionate way is our goal.  Partnering with you,  so that together we can achieve this goal is our strategy.

## 2016-07-03 NOTE — Assessment & Plan Note (Signed)
Controlled, no change in medication DASH diet and commitment to daily physical activity for a minimum of 30 minutes discussed and encouraged, as a part of hypertension management. The importance of attaining a healthy weight is also discussed.  BP/Weight 07/03/2016 06/29/2016 03/20/2016 12/20/2015 07/09/2015 02/24/2015 11/22/2014  Systolic BP 124 146 130 138 122 132 150  Diastolic BP 80 73 84 86 82 78 80  Wt. (Lbs) 344.04 330 348 356 330.12 324 328  BMI 57.25 58.46 57.91 59.24 54.93 53.92 54.58

## 2016-07-03 NOTE — Progress Notes (Signed)
Isabella Matthews     MRN: [id number]      DOB: 1970/07/16   HPI Isabella Matthews is here for follow up and re-evaluation of chronic medical conditions, medication management and review of any available recent lab and radiology data.  Preventive health is updated, specifically  Cancer screening and Immunization.  Still needs mammogram ands eye exam Seen in the eD this past week with infected tooth, has dental appt later this week The PT denies any adverse reactions to current medications since the last visit.  Denies polyuria, polydipsia, blurred vision , or hypoglycemic episodes. Has not been testing regularly. Still no exercise commitment, and does note elevated blood sugars when she does test   ROS Denies recent fever or chills. Denies sinus pressure, nasal congestion, ear pain or sore throat. Denies chest congestion, productive cough or wheezing. Denies chest pains, palpitations and leg swelling Denies abdominal pain, nausea, vomiting,diarrhea or constipation.   Denies dysuria, frequency, hesitancy or incontinence. Denies joint pain, swelling and limitation in mobility. Denies headaches, seizures, numbness, or tingling. Denies depression, anxiety or insomnia. Denies skin break down or rash.   PE  BP 124/80 (BP Location: Left Arm, Patient Position: Sitting, Cuff Size: Large)   Pulse 88   Temp 98.3 F (36.8 C) (Temporal)   Resp 20   Ht 5\' 5"  (1.651 m)   Wt (!) 344 lb 0.6 oz (156.1 kg)   SpO2 96%   BMI 57.25 kg/m   Patient alert and oriented and in no cardiopulmonary distress.  HEENT: No facial asymmetry, EOMI,   oropharynx pink and moist.  Neck supple no JVD, no mass.  Chest: Clear to auscultation bilaterally.  CVS: S1, S2 no murmurs, no S3.Regular rate.  ABD: Soft non tender.   Ext: No edema  MS: Adequate ROM spine, shoulders, hips and knees.  Skin: Intact, no ulcerations or rash noted.  Psych: Good eye contact, normal affect. Memory intact not anxious or  depressed appearing.  CNS: CN 2-12 intact, power,  normal throughout.no focal deficits noted.   Assessment & Plan  Type 2 diabetes mellitus with diabetic nephropathy Deteriorated needs to commit to testing daily and controlling catrb intake Isabella Matthews is reminded of the importance of commitment to daily physical activity for 30 minutes or more, as able and the need to limit carbohydrate intake to 30 to 60 grams per meal to help with blood sugar control.   The need to take medication as prescribed, test blood sugar as directed, and to call between visits if there is a concern that blood sugar is uncontrolled is also discussed.   Isabella Matthews is reminded of the importance of daily foot exam, annual eye examination, and good blood sugar, blood pressure and cholesterol control.  Diabetic Labs Latest Ref Rng & Units 06/27/2016 03/16/2016 11/05/2015 06/28/2015 02/24/2015  HbA1c <5.7 % 8.2(H) 7.4(H) 10.3(H) 7.5(H) 8.3(H)  Microalbumin Not estab mg/dL - - 0.5 - -  Micro/Creat Ratio <30 mcg/mg creat - - 9 - -  Chol <200 mg/dL - 409 811 - -  HDL >91 mg/dL - 77 55 - -  Calc LDL <478 mg/dL - 74 295 - -  Triglycerides <150 mg/dL - 621 308 - -  Creatinine 0.50 - 1.10 mg/dL 6.57 8.46 9.62 9.52 8.41   BP/Weight 07/03/2016 06/29/2016 03/20/2016 12/20/2015 07/09/2015 02/24/2015 11/22/2014  Systolic BP 124 146 130 138 122 132 150  Diastolic BP 80 73 84 86 82 78 80  Wt. (Lbs) 344.04 330 348 356 330.12  324 328  BMI 57.25 58.46 57.91 59.24 54.93 53.92 54.58   Foot/eye exam completion dates Latest Ref Rng & Units 12/20/2015 11/18/2014  Eye Exam No Retinopathy - -  Foot exam Order - - -  Foot Form Completion - Done Done        Essential hypertension Controlled, no change in medication DASH diet and commitment to daily physical activity for a minimum of 30 minutes discussed and encouraged, as a part of hypertension management. The importance of attaining a healthy weight is also discussed.  BP/Weight  07/03/2016 06/29/2016 03/20/2016 12/20/2015 07/09/2015 02/24/2015 11/22/2014  Systolic BP 124 146 130 138 122 132 150  Diastolic BP 80 73 84 86 82 78 80  Wt. (Lbs) 344.04 330 348 356 330.12 324 328  BMI 57.25 58.46 57.91 59.24 54.93 53.92 54.58       Hyperlipidemia LDL goal <100 Hyperlipidemia:Low fat diet discussed and encouraged.   Lipid Panel  Lab Results  Component Value Date   CHOL 173 03/16/2016   HDL 77 03/16/2016   LDLCALC 74 03/16/2016   TRIG 109 03/16/2016   CHOLHDL 2.2 03/16/2016   Controlled, no change in medication     MORBID OBESITY Deteriorated. Patient re-educated about  the importance of commitment to a  minimum of 150 minutes of exercise per week.  The importance of healthy food choices with portion control discussed. Encouraged to start a food diary, count calories and to consider  joining a support group. Sample diet sheets offered. Goals set by the patient for the next several months.   Weight /BMI 07/03/2016 06/29/2016 03/20/2016  WEIGHT 344 lb 0.6 oz 330 lb 348 lb  HEIGHT 5\' 5"  5\' 3"  5\' 5"   BMI 57.25 kg/m2 58.46 kg/m2 57.91 kg/m2

## 2016-07-05 ENCOUNTER — Other Ambulatory Visit: Payer: Self-pay

## 2016-07-05 MED ORDER — LOSARTAN POTASSIUM 50 MG PO TABS: 50.0000 mg | ORAL_TABLET | Freq: Every day | ORAL | 3 refills | Status: DC

## 2016-07-05 MED ORDER — INSULIN DETEMIR 100 UNIT/ML FLEXPEN: 70.0000 [IU] | PEN_INJECTOR | Freq: Every day | SUBCUTANEOUS | 1 refills | Status: DC

## 2016-07-05 MED ORDER — GABAPENTIN 300 MG PO CAPS: 300.0000 mg | ORAL_CAPSULE | Freq: Every day | ORAL | 3 refills | Status: DC

## 2016-07-05 MED ORDER — DAPAGLIFLOZIN PROPANEDIOL 10 MG PO TABS
10.0000 mg | ORAL_TABLET | Freq: Every day | ORAL | 3 refills | Status: DC
Start: 2016-07-05 — End: 2016-11-27

## 2016-07-05 MED ORDER — HYDROCHLOROTHIAZIDE 25 MG PO TABS: 25.0000 mg | ORAL_TABLET | Freq: Every day | ORAL | 3 refills | Status: DC

## 2016-07-05 MED ORDER — PRAVASTATIN SODIUM 20 MG PO TABS: 20.0000 mg | ORAL_TABLET | Freq: Every day | ORAL | 3 refills | Status: DC

## 2016-07-05 MED ORDER — SITAGLIPTIN PHOSPHATE 100 MG PO TABS: 100.0000 mg | ORAL_TABLET | Freq: Every day | ORAL | 3 refills | Status: DC

## 2016-07-05 MED ORDER — GLIMEPIRIDE 4 MG PO TABS: 4.0000 mg | ORAL_TABLET | Freq: Two times a day (BID) | ORAL | 3 refills | Status: DC

## 2016-07-06 ENCOUNTER — Telehealth: Payer: Self-pay | Admitting: Family Medicine

## 2016-07-06 NOTE — Telephone Encounter (Signed)
Patient wanted to let you know you can [facility] [phone]

## 2016-07-10 NOTE — Telephone Encounter (Signed)
meds faxed

## 2016-07-25 ENCOUNTER — Telehealth: Payer: Self-pay | Admitting: *Deleted

## 2016-07-25 NOTE — Telephone Encounter (Signed)
Patient stated that was the number to [facility]

## 2016-07-25 NOTE — Telephone Encounter (Signed)
Called and spoke to Rodneisha, is unsure of what pharmacy she is supposed to use with her new insurance. She is going to find out and call us back.

## 2016-07-25 NOTE — Telephone Encounter (Signed)
Patient called back with the number to [facility] [phone]

## 2016-07-25 NOTE — Telephone Encounter (Signed)
Patient called requesting a refill on pravatastan sodium tablets,gabapentin, levemir, hyprochlorothiazide, potassium. Per Patient please contact - [facility] [phone]

## 2016-07-25 NOTE — Telephone Encounter (Signed)
I have called this number.. It is to locate a provider?

## 2016-08-08 ENCOUNTER — Telehealth: Payer: Self-pay | Admitting: Family Medicine

## 2016-08-08 MED ORDER — GABAPENTIN 300 MG PO CAPS: 300.0000 mg | ORAL_CAPSULE | Freq: Every day | ORAL | 3 refills | Status: DC

## 2016-08-08 MED ORDER — LOSARTAN POTASSIUM 50 MG PO TABS: 50.0000 mg | ORAL_TABLET | Freq: Every day | ORAL | 3 refills | Status: DC

## 2016-08-08 MED ORDER — PRAVASTATIN SODIUM 20 MG PO TABS: 20.0000 mg | ORAL_TABLET | Freq: Every day | ORAL | 3 refills | Status: DC

## 2016-08-08 NOTE — Telephone Encounter (Signed)
New Message   *STAT* If patient is at the pharmacy, call can be transferred to refill team.   1. Which medications need to be refilled? (please list name of each medication and dose if known)  gabapentin (Neurontin) 300 mg capsule losartan (Cozaar) 50 mg tablet pravastatin (Pravachol) 20 mg tablet  2. Which pharmacy/location (including street and city if local pharmacy) is medication to be sent to? [facility]  3. Do they need a 30 day or 90 day supply?  90 days supply

## 2016-08-08 NOTE — Telephone Encounter (Signed)
[facility] w[facility]  [phone]  Pt's Rx will cost around $2000   Can something else be written for pt?   [facility] is the insurance carrier if you would like covered alternatives call this number [phone]

## 2016-08-08 NOTE — Telephone Encounter (Signed)
I need to know which prescription is costing this and what are the alternatives that are affordable, please get help with this as needed

## 2016-08-09 ENCOUNTER — Other Ambulatory Visit: Payer: Self-pay | Admitting: Family Medicine

## 2016-08-09 ENCOUNTER — Ambulatory Visit (HOSPITAL_COMMUNITY)
Admission: RE | Admit: 2016-08-09 | Discharge: 2016-08-09 | Disposition: A | Discharge status: Home / Self Care | Payer: No Typology Code available for payment source | Source: Ambulatory Visit | Attending: Family Medicine | Admitting: Family Medicine

## 2016-08-09 DIAGNOSIS — Z1231 Encounter for screening mammogram for malignant neoplasm of breast: Secondary | ICD-10-CM | POA: Insufficient documentation

## 2016-08-09 MED ORDER — INSULIN GLARGINE 100 UNIT/ML SOLOSTAR PEN: 70.0000 [IU] | PEN_INJECTOR | Freq: Every day | SUBCUTANEOUS | 3 refills | Status: DC

## 2016-08-09 NOTE — Telephone Encounter (Signed)
I will change levemir to lantius at same dose and send in new script , pls fax and let Bonita QuinLinda know

## 2016-08-09 NOTE — Telephone Encounter (Signed)
Patient informed of message below, verbalized understanding.  

## 2016-08-09 NOTE — Telephone Encounter (Signed)
Called patient, mother answered phone, informed that daughter would be back and would call back today.

## 2016-08-09 NOTE — Telephone Encounter (Signed)
Spoke to [facility]- levemir is not covered by insurance. Spoke to insurance- only lantus is covered.

## 2016-08-14 ENCOUNTER — Other Ambulatory Visit: Payer: Self-pay | Admitting: Family Medicine

## 2016-08-14 MED ORDER — PRAVASTATIN SODIUM 20 MG PO TABS: 20.0000 mg | ORAL_TABLET | Freq: Every day | ORAL | 3 refills | Status: DC

## 2016-08-14 MED ORDER — GABAPENTIN 300 MG PO CAPS: 300.0000 mg | ORAL_CAPSULE | Freq: Every day | ORAL | 3 refills | Status: DC

## 2016-08-14 MED ORDER — LOSARTAN POTASSIUM 50 MG PO TABS: 50.0000 mg | ORAL_TABLET | Freq: Every day | ORAL | 3 refills | Status: DC

## 2016-10-17 LAB — BASIC METABOLIC PANEL WITH GFR
BUN: 14 mg/dL (ref 7–25)
CO2: 26 mmol/L (ref 20–32)
CREATININE: 0.69 mg/dL (ref 0.50–1.10)
Calcium: 9.5 mg/dL (ref 8.6–10.2)
Chloride: 103 mmol/L (ref 98–110)
GFR, EST AFRICAN AMERICAN: 120 mL/min/{1.73_m2} (ref >=60)
GFR, Est Non African American: 104 mL/min/{1.73_m2} (ref >=60)
Glucose, Bld: 149 mg/dL — ABNORMAL HIGH (ref 65–139)
Potassium: 3.9 mmol/L (ref 3.5–5.3)
SODIUM: 140 mmol/L (ref 135–146)

## 2016-10-17 LAB — HEMOGLOBIN A1C
EAG (MMOL/L): 10.6 (calc)
HEMOGLOBIN A1C: 8.3 %{Hb} — AB (ref <5.7)
Mean Plasma Glucose: 192 (calc)

## 2016-10-24 ENCOUNTER — Encounter: Payer: Self-pay | Admitting: Family Medicine

## 2016-10-24 ENCOUNTER — Ambulatory Visit (INDEPENDENT_AMBULATORY_CARE_PROVIDER_SITE_OTHER): Payer: No Typology Code available for payment source | Admitting: Family Medicine

## 2016-10-24 VITALS — BP 122/78 | HR 66 | Temp 98.3°F | Resp 16 | Ht 65.0 in | Wt 346.0 lb

## 2016-10-24 DIAGNOSIS — E1121 Type 2 diabetes mellitus with diabetic nephropathy: Secondary | ICD-10-CM

## 2016-10-24 DIAGNOSIS — M170 Bilateral primary osteoarthritis of knee: Secondary | ICD-10-CM

## 2016-10-24 DIAGNOSIS — Z794 Long term (current) use of insulin: Secondary | ICD-10-CM

## 2016-10-24 DIAGNOSIS — E785 Hyperlipidemia, unspecified: Secondary | ICD-10-CM | POA: Diagnosis not present

## 2016-10-24 DIAGNOSIS — Z23 Encounter for immunization: Secondary | ICD-10-CM | POA: Diagnosis not present

## 2016-10-24 DIAGNOSIS — I10 Essential (primary) hypertension: Secondary | ICD-10-CM

## 2016-10-24 MED ORDER — INSULIN GLARGINE 100 UNIT/ML SOLOSTAR PEN: 80.0000 [IU] | PEN_INJECTOR | Freq: Every day | SUBCUTANEOUS | 3 refills | Status: DC

## 2016-10-24 NOTE — Patient Instructions (Addendum)
F/u 2nd week in January , call if you need me sooner  Flu vaccine today  3 to 5 days before next visit fasting lipid, cmp lipid, cmp and EGFr, hBa1C, TSH and vit D  Return 3 stool cards for testing first week in December and submit microalbuminuria at that time also,  We willll send for and documenrt eye exam in August at my eye Doc in [place]  Stop januvia  increase lantus to 80 units daily  Commit to at Least twice daily testing and record  FBG: 80 to 130  Bedtime 140 to 180  Water only  At least 3 meals every day at set times  .It is important that you exercise regularly at least 30 minutes 5 times a week. If you develop chest pain, have severe difficulty breathing, or feel very tired, stop exercising immediately and seek medical attention

## 2016-10-28 NOTE — Assessment & Plan Note (Signed)
Hyperlipidemia:Low fat diet discussed and encouraged.   Lipid Panel  Lab Results  Component Value Date   CHOL 173 03/16/2016   HDL 77 03/16/2016   LDLCALC 74 03/16/2016   TRIG 109 03/16/2016   CHOLHDL 2.2 03/16/2016   Updated lab needed at/ before next visit.

## 2016-10-28 NOTE — Assessment & Plan Note (Signed)
Uncontrolled increase insulin and d/c januvia [name] is reminded of the importance of commitment to daily physical activity for 30 minutes or more, as able and the need to limit carbohydrate intake to 30 to 60 grams per meal to help with blood sugar control.   The need to take medication as prescribed, test blood sugar as directed, and to call between visits if there is a concern that blood sugar is uncontrolled is also discussed.   Isabella Matthews is reminded of the importance of daily foot exam, annual eye examination, and good blood sugar, blood pressure and cholesterol control.  Diabetic Labs Latest Ref Rng & Units 10/16/2016 06/27/2016 03/16/2016 11/05/2015 06/28/2015  HbA1c <5.7 % of total Hgb 8.3(H) 8.2(H) 7.4(H) 10.3(H) 7.5(H)  Microalbumin Not estab mg/dL - - - 0.5 -  Micro/Creat Ratio <30 mcg/mg creat - - - 9 -  Chol <200 mg/dL - - 161 096 -  HDL >04 mg/dL - - 77 55 -  Calc LDL <540 mg/dL - - 74 981 -  Triglycerides <150 mg/dL - - 191 478 -  Creatinine 0.50 - 1.10 mg/dL 2.95 6.21 3.08 6.57 8.46   BP/Weight 10/24/2016 07/03/2016 06/29/2016 03/20/2016 12/20/2015 07/09/2015 02/24/2015  Systolic BP 122 124 146 130 138 122 132  Diastolic BP 78 80 73 84 86 82 78  Wt. (Lbs) 346 344.04 330 348 356 330.12 324  BMI 57.58 57.25 58.46 57.91 59.24 54.93 53.92   Foot/eye exam completion dates Latest Ref Rng & Units 12/20/2015 11/18/2014  Eye Exam No Retinopathy - -  Foot exam Order - - -  Foot Form Completion - Done Done      Updated lab needed at/ before next visit.

## 2016-10-28 NOTE — Assessment & Plan Note (Signed)
Unchanged, needs to work on weight loss to improve pain and debility

## 2016-10-28 NOTE — Assessment & Plan Note (Signed)
Deteriorated. Patient re-educated about  the importance of commitment to a  minimum of 150 minutes of exercise per week.  The importance of healthy food choices with portion control discussed. Encouraged to start a food diary, count calories and to consider  joining a support group. Sample diet sheets offered. Goals set by the patient for the next several months.   Weight /BMI 10/24/2016 07/03/2016 06/29/2016  WEIGHT 346 lb 344 lb 0.6 oz 330 lb  HEIGHT     BMI 57.58 kg/m2 57.25 kg/m2 58.46 kg/m2

## 2016-10-28 NOTE — Progress Notes (Signed)
Isabella Matthews     MRN: [id number]      DOB: Jun 22, 1970   HPI Isabella Matthews is here for follow up and re-evaluation of chronic medical conditions, medication management and review of any available recent lab and radiology data.  Preventive health is updated, specifically  Cancer screening and Immunization.   Questions or concerns regarding consultations or procedures which the PT has had in the interim are  addressed. The PT denies any adverse reactions to current medications since the last visit.  There are no new concerns.  There are no specific complaints  Denies polyuria, polydipsia, blurred vision , or hypoglycemic episodes. Still has significant fluctuations in blood sugar depending on diet ROS Denies recent fever or chills. Denies sinus pressure, nasal congestion, ear pain or sore throat. Denies chest congestion, productive cough or wheezing. Denies chest pains, palpitations and leg swelling Denies abdominal pain, nausea, vomiting,diarrhea or constipation.   Denies dysuria, frequency, hesitancy or incontinence. Denies uncontrolled joint pain,  Does have swelling and limitation in mobility.of her knees at times Denies headaches, seizures, numbness, or tingling. Denies depression, anxiety or insomnia. Denies skin break down or rash.   PE  BP 122/78 (BP Location: Left Arm, Patient Position: Sitting, Cuff Size: Normal)   Pulse 66   Temp 98.3 F (36.8 C) (Other (Comment))   Resp 16   Ht  (1.651 m)   Wt (!) 346 lb (156.9 kg)   LMP  (LMP Unknown)   SpO2 98%   BMI 57.58 kg/m   Patient alert and oriented and in no cardiopulmonary distress.  HEENT: No facial asymmetry, EOMI,   oropharynx pink and moist.  Neck supple no JVD, no mass.  Chest: Clear to auscultation bilaterally.  CVS: S1, S2 no murmurs, no S3.Regular rate.  ABD: Soft non tender.   Ext: No edema  MS: Adequate ROM spine, shoulders, hips and reduced in  knees.  Skin: Intact, no ulcerations or  rash noted.  Psych: Good eye contact, normal affect. Memory intact not anxious or depressed appearing.  CNS: CN 2-12 intact, power,  normal throughout.no focal deficits noted.   Assessment & Plan  Type 2 diabetes mellitus with diabetic nephropathy Uncontrolled increase insulin and d/c januvia Isabella Matthews is reminded of the importance of commitment to daily physical activity for 30 minutes or more, as able and the need to limit carbohydrate intake to 30 to 60 grams per meal to help with blood sugar control.   The need to take medication as prescribed, test blood sugar as directed, and to call between visits if there is a concern that blood sugar is uncontrolled is also discussed.   Isabella Matthews is reminded of the importance of daily foot exam, annual eye examination, and good blood sugar, blood pressure and cholesterol control.  Diabetic Labs Latest Ref Rng & Units 10/16/2016 06/27/2016 03/16/2016 11/05/2015 06/28/2015  HbA1c <5.7 % of total Hgb 8.3(H) 8.2(H) 7.4(H) 10.3(H) 7.5(H)  Microalbumin Not estab mg/dL - - - 0.5 -  Micro/Creat Ratio <30 mcg/mg creat - - - 9 -  Chol <200 mg/dL - - 782 956 -  HDL >21 mg/dL - - 77 55 -  Calc LDL <308 mg/dL - - 74 657 -  Triglycerides <150 mg/dL - - 846 962 -  Creatinine 0.50 - 1.10 mg/dL 9.52 8.41 3.24 4.01 0.27   BP/Weight 10/24/2016 07/03/2016 06/29/2016 03/20/2016 12/20/2015 07/09/2015 02/24/2015  Systolic BP 122 124 146 130 138 122 132  Diastolic BP 78 80 73  84 86 82 78  Wt. (Lbs) 346 344.04 330 348 356 330.12 324  BMI 57.58 57.25 58.46 57.91 59.24 54.93 53.92   Foot/eye exam completion dates Latest Ref Rng & Units 12/20/2015 11/18/2014  Eye Exam No Retinopathy - -  Foot exam Order - - -  Foot Form Completion - Done Done      Updated lab needed at/ before next visit.   Essential hypertension Controlled, no change in medication DASH diet and commitment to daily physical activity for a minimum of 30 minutes discussed and encouraged, as a part  of hypertension management. The importance of attaining a healthy weight is also discussed.  BP/Weight 10/24/2016 07/03/2016 06/29/2016 03/20/2016 12/20/2015 07/09/2015 02/24/2015  Systolic BP 122 124 146 130 138 122 132  Diastolic BP 78 80 73 84 86 82 78  Wt. (Lbs) 346 344.04 330 348 356 330.12 324  BMI 57.58 57.25 58.46 57.91 59.24 54.93 53.92       MORBID OBESITY Deteriorated. Patient re-educated about  the importance of commitment to a  minimum of 150 minutes of exercise per week.  The importance of healthy food choices with portion control discussed. Encouraged to start a food diary, count calories and to consider  joining a support group. Sample diet sheets offered. Goals set by the patient for the next several months.   Weight /BMI 10/24/2016 07/03/2016 06/29/2016  WEIGHT 346 lb 344 lb 0.6 oz 330 lb  HEIGHT     BMI 57.58 kg/m2 57.25 kg/m2 58.46 kg/m2      Hyperlipidemia LDL goal <100 Hyperlipidemia:Low fat diet discussed and encouraged.   Lipid Panel  Lab Results  Component Value Date   CHOL 173 03/16/2016   HDL 77 03/16/2016   LDLCALC 74 03/16/2016   TRIG 109 03/16/2016   CHOLHDL 2.2 03/16/2016   Updated lab needed at/ before next visit.    Osteoarthritis of both knees Unchanged, needs to work on weight loss to improve pain and debility

## 2016-10-28 NOTE — Assessment & Plan Note (Signed)
Controlled, no change in medication DASH diet and commitment to daily physical activity for a minimum of 30 minutes discussed and encouraged, as a part of hypertension management. The importance of attaining a healthy weight is also discussed.  BP/Weight 10/24/2016 07/03/2016 06/29/2016 03/20/2016 12/20/2015 07/09/2015 02/24/2015  Systolic BP 122 124 146 130 138 122 132  Diastolic BP 78 80 73 84 86 82 78  Wt. (Lbs) 346 344.04 330 348 356 330.12 324  BMI 57.58 57.25 58.46 57.91 59.24 54.93 53.92

## 2016-11-27 ENCOUNTER — Telehealth: Payer: Self-pay | Admitting: Family Medicine

## 2016-11-27 ENCOUNTER — Other Ambulatory Visit: Payer: Self-pay

## 2016-11-27 MED ORDER — DAPAGLIFLOZIN PROPANEDIOL 10 MG PO TABS: 10.0000 mg | ORAL_TABLET | Freq: Every day | ORAL | 3 refills | Status: DC

## 2016-11-27 NOTE — Telephone Encounter (Signed)
Med sent in ( only farxiga)

## 2016-11-27 NOTE — Telephone Encounter (Signed)
[facility] on nurse line stating that patient needs refill on Januvia (if she is still supposed to be taking) and farxiga.

## 2016-12-07 ENCOUNTER — Telehealth: Payer: Self-pay | Admitting: Family Medicine

## 2016-12-07 NOTE — Telephone Encounter (Signed)
[facility] left [employer] on nurse line regarding patient. They states they have been faxing requests for several weeks to refill patient's Januvia and Farxiga.   Callback# [phone] Fax# [phone]

## 2016-12-07 NOTE — Telephone Encounter (Signed)
Januvia was discontinued

## 2016-12-07 NOTE — Telephone Encounter (Signed)
I have sent in the farxiga but I don't see Januvia on her list. Should she be taking?

## 2016-12-08 ENCOUNTER — Telehealth: Payer: Self-pay | Admitting: Family Medicine

## 2016-12-08 NOTE — Telephone Encounter (Signed)
[facility] on nurse line regarding patient. They got fax that Januvia has been d/c'd and that farxiga was being sent but they never received rx.   Call#[phone] Fax# [phone]

## 2016-12-08 NOTE — Telephone Encounter (Signed)
[facility] notified

## 2016-12-11 ENCOUNTER — Other Ambulatory Visit: Payer: Self-pay

## 2016-12-11 MED ORDER — DAPAGLIFLOZIN PROPANEDIOL 10 MG PO TABS: 10.0000 mg | ORAL_TABLET | Freq: Every day | ORAL | 3 refills | Status: DC

## 2016-12-11 NOTE — Telephone Encounter (Signed)
MESSAGE RESENT

## 2016-12-18 ENCOUNTER — Telehealth: Payer: Self-pay | Admitting: *Deleted

## 2016-12-18 NOTE — Telephone Encounter (Signed)
[facility] called stating her insurance will not cover the medication that was sent in, but it will cover jardian  And invonka. Any questions please call [phone]

## 2016-12-19 ENCOUNTER — Other Ambulatory Visit: Payer: Self-pay | Admitting: Family Medicine

## 2016-12-19 ENCOUNTER — Other Ambulatory Visit: Payer: Self-pay

## 2016-12-19 MED ORDER — SITAGLIPTIN PHOSPHATE 100 MG PO TABS: 100.0000 mg | ORAL_TABLET | Freq: Every day | ORAL | 1 refills | Status: DC

## 2016-12-19 MED ORDER — SITAGLIPTIN PHOSPHATE 100 MG PO TABS
100.0000 mg | ORAL_TABLET | Freq: Every day | ORAL | 4 refills | Status: DC
Start: 2016-12-19 — End: 2016-12-19

## 2016-12-19 NOTE — Telephone Encounter (Signed)
noted 

## 2016-12-19 NOTE — Progress Notes (Signed)
januvia

## 2016-12-19 NOTE — Telephone Encounter (Signed)
farxiga not covered. They will cover either jardiance or invokana.

## 2016-12-19 NOTE — Telephone Encounter (Signed)
Thios is a change from the last visit when I recommended stopping the [place]. Intead, I prefer that she CONTUE the [place]ia and not take either the jardiance or invokana, I am sending the [place] to mail order. I left her a message to call you, I tried calling her

## 2017-01-02 DIAGNOSIS — Z1211 Encounter for screening for malignant neoplasm of colon: Secondary | ICD-10-CM

## 2017-01-02 LAB — HEMOCCULT GUIAC POC 1CARD (OFFICE)
Card #3 Fecal Occult Blood, POC: NEGATIVE
FECAL OCCULT BLD: NEGATIVE
Fecal Occult Blood, POC: NEGATIVE

## 2017-01-08 ENCOUNTER — Other Ambulatory Visit: Payer: Self-pay

## 2017-01-08 MED ORDER — SITAGLIPTIN PHOSPHATE 100 MG PO TABS: 100.0000 mg | ORAL_TABLET | Freq: Every day | ORAL | 1 refills | Status: DC

## 2017-01-29 LAB — LIPID PANEL
CHOL/HDL RATIO: 2.3 (calc) (ref <5.0)
CHOLESTEROL: 180 mg/dL (ref <200)
HDL: 80 mg/dL (ref >50)
LDL CHOLESTEROL (CALC): 81 mg/dL
Non-HDL Cholesterol (Calc): 100 mg/dL (calc) (ref <130)
Triglycerides: 101 mg/dL (ref <150)

## 2017-01-29 LAB — COMPLETE METABOLIC PANEL WITH GFR
AG RATIO: 1.3 (calc) (ref 1.0–2.5)
ALKALINE PHOSPHATASE (APISO): 66 U/L (ref 33–115)
ALT: 17 U/L (ref 6–29)
AST: 16 U/L (ref 10–35)
Albumin: 4.2 g/dL (ref 3.6–5.1)
BILIRUBIN TOTAL: 0.6 mg/dL (ref 0.2–1.2)
BUN: 16 mg/dL (ref 7–25)
CHLORIDE: 101 mmol/L (ref 98–110)
CO2: 27 mmol/L (ref 20–32)
CREATININE: 0.62 mg/dL (ref 0.50–1.10)
Calcium: 9.6 mg/dL (ref 8.6–10.2)
GFR, Est African American: 124 mL/min/{1.73_m2} (ref >=60)
GFR, Est Non African American: 107 mL/min/{1.73_m2} (ref >=60)
Globulin: 3.2 g/dL (calc) (ref 1.9–3.7)
Glucose, Bld: 128 mg/dL — ABNORMAL HIGH (ref 65–99)
POTASSIUM: 4.4 mmol/L (ref 3.5–5.3)
SODIUM: 137 mmol/L (ref 135–146)
Total Protein: 7.4 g/dL (ref 6.1–8.1)

## 2017-01-29 LAB — HEMOGLOBIN A1C
HEMOGLOBIN A1C: 7.1 %{Hb} — AB (ref <5.7)
Mean Plasma Glucose: 157 (calc)
eAG (mmol/L): 8.7 (calc)

## 2017-01-29 LAB — MICROALBUMIN / CREATININE URINE RATIO
Creatinine, Urine: 177 mg/dL (ref 20–275)
Microalb Creat Ratio: 188 mcg/mg creat — ABNORMAL HIGH (ref <30)
Microalb, Ur: 33.3 mg/dL

## 2017-01-29 LAB — VITAMIN D 25 HYDROXY (VIT D DEFICIENCY, FRACTURES): VIT D 25 HYDROXY: 5 ng/mL — AB (ref 30–100)

## 2017-01-29 LAB — TSH: TSH: 0.96 m[IU]/L

## 2017-01-30 ENCOUNTER — Ambulatory Visit: Payer: No Typology Code available for payment source | Admitting: Family Medicine

## 2017-03-07 ENCOUNTER — Telehealth: Payer: Self-pay | Admitting: Family Medicine

## 2017-03-07 DIAGNOSIS — Z794 Long term (current) use of insulin: Principal | ICD-10-CM

## 2017-03-07 DIAGNOSIS — E785 Hyperlipidemia, unspecified: Secondary | ICD-10-CM

## 2017-03-07 DIAGNOSIS — E1121 Type 2 diabetes mellitus with diabetic nephropathy: Secondary | ICD-10-CM

## 2017-03-07 DIAGNOSIS — I10 Essential (primary) hypertension: Secondary | ICD-10-CM

## 2017-03-07 NOTE — Telephone Encounter (Signed)
Need Non fast CBC, chem 7  And EGFR and HBA1C for appointment which she needs scheduled last week in April. Had better labs in Jan, but missed appt, pls sched the appt and mail her labs also, thanks

## 2017-03-08 NOTE — Addendum Note (Signed)
Addended by: Abner GreenspanHUDY, Davinity Fanara H on: 03/08/2017 09:28 AM   Modules accepted: Orders

## 2017-03-08 NOTE — Telephone Encounter (Signed)
Needs appt scheduled for end of April. (I am mailing her fasting lab order to do before the visit)

## 2017-03-16 NOTE — Telephone Encounter (Signed)
Could not get anyone to answer, I made an appt and left it on the answering machine to call if that was a problem.

## 2017-05-12 LAB — BASIC METABOLIC PANEL WITH GFR
BUN: 16 mg/dL (ref 7–25)
CHLORIDE: 102 mmol/L (ref 98–110)
CO2: 25 mmol/L (ref 20–32)
Calcium: 9.5 mg/dL (ref 8.6–10.2)
Creat: 0.84 mg/dL (ref 0.50–1.10)
GFR, EST AFRICAN AMERICAN: 95 mL/min/{1.73_m2} (ref >=60)
GFR, Est Non African American: 82 mL/min/{1.73_m2} (ref >=60)
Glucose, Bld: 132 mg/dL — ABNORMAL HIGH (ref 65–99)
POTASSIUM: 4.3 mmol/L (ref 3.5–5.3)
Sodium: 138 mmol/L (ref 135–146)

## 2017-05-12 LAB — CBC
HCT: 35.9 % (ref 35.0–45.0)
Hemoglobin: 12.6 g/dL (ref 11.7–15.5)
MCH: 31.3 pg (ref 27.0–33.0)
MCHC: 35.1 g/dL (ref 32.0–36.0)
MCV: 89.3 fL (ref 80.0–100.0)
MPV: 10.4 fL (ref 7.5–12.5)
PLATELETS: 232 10*3/uL (ref 140–400)
RBC: 4.02 10*6/uL (ref 3.80–5.10)
RDW: 12.3 % (ref 11.0–15.0)
WBC: 4.7 10*3/uL (ref 3.8–10.8)

## 2017-05-12 LAB — HEMOGLOBIN A1C
EAG (MMOL/L): 9 (calc)
HEMOGLOBIN A1C: 7.3 %{Hb} — AB (ref <5.7)
Mean Plasma Glucose: 163 (calc)

## 2017-05-13 ENCOUNTER — Encounter: Payer: Self-pay | Admitting: Family Medicine

## 2017-05-16 ENCOUNTER — Encounter: Payer: Self-pay | Admitting: Family Medicine

## 2017-05-16 ENCOUNTER — Ambulatory Visit (INDEPENDENT_AMBULATORY_CARE_PROVIDER_SITE_OTHER): Payer: PRIVATE HEALTH INSURANCE | Admitting: Family Medicine

## 2017-05-16 VITALS — BP 160/90 | HR 85 | Resp 16 | Ht 65.0 in | Wt 355.1 lb

## 2017-05-16 DIAGNOSIS — E1121 Type 2 diabetes mellitus with diabetic nephropathy: Secondary | ICD-10-CM

## 2017-05-16 DIAGNOSIS — E785 Hyperlipidemia, unspecified: Secondary | ICD-10-CM

## 2017-05-16 DIAGNOSIS — I10 Essential (primary) hypertension: Secondary | ICD-10-CM | POA: Diagnosis not present

## 2017-05-16 MED ORDER — NYSTATIN 100000 UNIT/GM EX POWD: CUTANEOUS | 1 refills | Status: DC

## 2017-05-16 NOTE — Patient Instructions (Addendum)
Mmamogram is due July , please schedule and have this done  Eye exam is past due please schedule and have this done  F/u in end September, call if yu need me sooner  Non Fasting lchem 7 and EGFR, hBA1C 1 week before September visit  STOP farxiga  Increase lantus to 85 units, call iof blood sugar fasting stays above 130  It is important that you exercise regularly at least 30 minutes 5 times a week. If you develop chest pain, have severe difficulty breathing, or feel very tired, stop exercising immediately and seek medical attention   Weight loss goal of 10 pounds  Thank you  for choosing [facility]. We consider it a privelige to serve you.  Delivering excellent health care in a caring and  compassionate way is our goal.  Partnering with you,  so that together we can achieve this goal is our strategy.

## 2017-05-22 ENCOUNTER — Encounter: Payer: Self-pay | Admitting: Family Medicine

## 2017-05-22 NOTE — Assessment & Plan Note (Signed)
Uncontrolled ,patient did not take  Her medication on the day of the visit, no changesin medication at this time DASH diet and commitment to daily physical activity for a minimum of 30 minutes discussed and encouraged, as a part of hypertension management. The importance of attaining a healthy weight is also discussed.  BP/Weight 05/16/2017 10/24/2016 07/03/2016 06/29/2016 03/20/2016 12/20/2015 07/09/2015  Systolic BP 160 122 124 146 130 138 122  Diastolic BP 90 78 80 73 84 86 82  Wt. (Lbs) 355.12 346 344.04 330 348 356 330.12  BMI 59.1 57.58 57.25 58.46 57.91 59.24 54.93   She is advised of the need to take medicationsdaily

## 2017-05-22 NOTE — Assessment & Plan Note (Addendum)
Isabella Matthews is reminded of the importance of commitment to daily physical activity for 30 minutes or more, as able and the need to limit carbohydrate intake to 30 to 60 grams per meal to help with blood sugar control.   The need to take medication as prescribed, test blood sugar as directed, and to call between visits if there is a concern that blood sugar is uncontrolled is also discussed.   Isabella Matthews is reminded of the importance of daily foot exam, annual eye examination, and good blood sugar, blood pressure and cholesterol control. Controlled but not as well as in the past, need to work on change in food choice and execise and weight loss commitment She is to STOP farxiga and increase lantus   Diabetic Labs Latest Ref Rng & Units 05/11/2017 01/27/2017 10/16/2016 06/27/2016 03/16/2016  HbA1c <5.7 % of total Hgb 7.3(H) 7.1(H) 8.3(H) 8.2(H) 7.4(H)  Microalbumin mg/dL - 96.0 - - -  Micro/Creat Ratio <30 mcg/mg creat - 188(H) - - -  Chol <200 mg/dL - 454 - - 098  HDL >11 mg/dL - 80 - - 77  Calc LDL mg/dL (calc) - 81 - - 74  Triglycerides <150 mg/dL - 914 - - 782  Creatinine 0.50 - 1.10 mg/dL 9.56 2.13 0.86 5.78 4.69   BP/Weight 05/16/2017 10/24/2016 07/03/2016 06/29/2016 03/20/2016 12/20/2015 07/09/2015  Systolic BP 160 122 124 146 130 138 122  Diastolic BP 90 78 80 73 84 86 82  Wt. (Lbs) 355.12 346 344.04 330 348 356 330.12  BMI 59.1 57.58 57.25 58.46 57.91 59.24 54.93   Foot/eye exam completion dates Latest Ref Rng & Units 05/16/2017 12/20/2015  Eye Exam No Retinopathy - -  Foot exam Order - - -  Foot Form Completion - Done Done

## 2017-05-22 NOTE — Progress Notes (Signed)
Isabella Matthews     MRN: [id number]      DOB: Aug 02, 1970   HPI Ms. Golomb is here for follow up and re-evaluation of chronic medical conditions, medication management and review of any available recent lab and radiology data.  Preventive health is updated, specifically  Cancer screening and Immunization.   Questions or concerns regarding consultations or procedures which the PT has had in the interim are  addressed. The PT denies any adverse reactions to current medications since the last visit.  Overwhelmed with her job and has recently been considering seeking a new position Has not been as focused as she would like on lifestyle change needed for improved health , but continues to work on it Denies polyuria, polydipsia, blurred vision , or hypoglycemic episodes.   ROS Denies recent fever or chills. Denies sinus pressure, nasal congestion, ear pain or sore throat. Denies chest congestion, productive cough or wheezing. Denies chest pains, palpitations and leg swelling Denies abdominal pain, nausea, vomiting,diarrhea or constipation.   Denies dysuria, frequency, hesitancy or incontinence. Denies joint pain, swelling and limitation in mobility. Denies headaches, seizures, numbness, or tingling. Denies skin break down or rash.   PE  BP (!) 160/90   Pulse 85   Resp 16   Ht  (1.651 m)   Wt (!) 355 lb 1.9 oz (161.1 kg)   SpO2 94%   BMI 59.10 kg/m   Patient alert and oriented and in no cardiopulmonary distress.  HEENT: No facial asymmetry, EOMI,   oropharynx pink and moist.  Neck supple no JVD, no mass.  Chest: Clear to auscultation bilaterally.  CVS: S1, S2 no murmurs, no S3.Regular rate.  ABD: Soft non tender.   Ext: No edema  MS: Adequate though reduced  ROM spine, shoulders, hips and knees.  Skin: Intact, no ulcerations or rash noted.  Psych: Good eye contact, normal affect. Memory intact not anxious or depressed appearing.  CNS: CN 2-12 intact, power,   normal throughout.no focal deficits noted.   Assessment & Plan  Essential hypertension Uncontrolled ,patient did not take  Her medication on the day of the visit, no changesin medication at this time DASH diet and commitment to daily physical activity for a minimum of 30 minutes discussed and encouraged, as a part of hypertension management. The importance of attaining a healthy weight is also discussed.  BP/Weight 05/16/2017 10/24/2016 07/03/2016 06/29/2016 03/20/2016 12/20/2015 07/09/2015  Systolic BP 160 122 124 146 130 138 122  Diastolic BP 90 78 80 73 84 86 82  Wt. (Lbs) 355.12 346 344.04 330 348 356 330.12  BMI 59.1 57.58 57.25 58.46 57.91 59.24 54.93   She is advised of the need to take medicationsdaily     Hyperlipidemia LDL goal <100 Hyperlipidemia:Low fat diet discussed and encouraged.   Lipid Panel  Lab Results  Component Value Date   CHOL 180 01/27/2017   HDL 80 01/27/2017   LDLCALC 81 01/27/2017   TRIG 101 01/27/2017   CHOLHDL 2.3 01/27/2017   Controlled, no change in medication     MORBID OBESITY Deteriorated. Patient re-educated about  the importance of commitment to a  minimum of 150 minutes of exercise per week.  The importance of healthy food choices with portion control discussed. Encouraged to start a food diary, count calories and to consider  joining a support group. Sample diet sheets offered. Goals set by the patient for the next several months.   Weight /BMI 05/16/2017 10/24/2016 07/03/2016  WEIGHT 355 lb  1.9 oz 346 lb 344 lb 0.6 oz  HEIGHT     BMI 59.1 kg/m2 57.58 kg/m2 57.25 kg/m2  Needs to change diet and focus on daily exercise to lose weight    Type 2 diabetes mellitus with diabetic nephropathy Ms. Pence is reminded of the importance of commitment to daily physical activity for 30 minutes or more, as able and the need to limit carbohydrate intake to 30 to 60 grams per meal to help with blood sugar control.   The need to  take medication as prescribed, test blood sugar as directed, and to call between visits if there is a concern that blood sugar is uncontrolled is also discussed.   Ms. Strike is reminded of the importance of daily foot exam, annual eye examination, and good blood sugar, blood pressure and cholesterol control. Controlled but not as well as in the past, need to work on change in food choice and execise and weight loss commitment She is to STOP farxiga and increase lantus   Diabetic Labs Latest Ref Rng & Units 05/11/2017 01/27/2017 10/16/2016 06/27/2016 03/16/2016  HbA1c <5.7 % of total Hgb 7.3(H) 7.1(H) 8.3(H) 8.2(H) 7.4(H)  Microalbumin mg/dL - 40.9 - - -  Micro/Creat Ratio <30 mcg/mg creat - 188(H) - - -  Chol <200 mg/dL - 811 - - 914  HDL >78 mg/dL - 80 - - 77  Calc LDL mg/dL (calc) - 81 - - 74  Triglycerides <150 mg/dL - 295 - - 621  Creatinine 0.50 - 1.10 mg/dL 3.08 6.57 8.46 9.62 9.52   BP/Weight 05/16/2017 10/24/2016 07/03/2016 06/29/2016 03/20/2016 12/20/2015 07/09/2015  Systolic BP 160 122 124 146 130 138 122  Diastolic BP 90 78 80 73 84 86 82  Wt. (Lbs) 355.12 346 344.04 330 348 356 330.12  BMI 59.1 57.58 57.25 58.46 57.91 59.24 54.93   Foot/eye exam completion dates Latest Ref Rng & Units 05/16/2017 12/20/2015  Eye Exam No Retinopathy - -  Foot exam Order - - -  Foot Form Completion - Done Done

## 2017-05-22 NOTE — Assessment & Plan Note (Signed)
Hyperlipidemia:Low fat diet discussed and encouraged.   Lipid Panel  Lab Results  Component Value Date   CHOL 180 01/27/2017   HDL 80 01/27/2017   LDLCALC 81 01/27/2017   TRIG 101 01/27/2017   CHOLHDL 2.3 01/27/2017   Controlled, no change in medication

## 2017-05-22 NOTE — Assessment & Plan Note (Signed)
Deteriorated. Patient re-educated about  the importance of commitment to a  minimum of 150 minutes of exercise per week.  The importance of healthy food choices with portion control discussed. Encouraged to start a food diary, count calories and to consider  joining a support group. Sample diet sheets offered. Goals set by the patient for the next several months.   Weight /BMI 05/16/2017 10/24/2016 07/03/2016  WEIGHT 355 lb 1.9 oz 346 lb 344 lb 0.6 oz  HEIGHT     BMI 59.1 kg/m2 57.58 kg/m2 57.25 kg/m2  Needs to change diet and focus on daily exercise to lose weight

## 2017-05-23 ENCOUNTER — Other Ambulatory Visit: Payer: Self-pay

## 2017-05-23 MED ORDER — NYSTATIN 100000 UNIT/GM EX POWD: CUTANEOUS | 1 refills | Status: DC

## 2017-05-24 ENCOUNTER — Other Ambulatory Visit: Payer: Self-pay | Admitting: Family Medicine

## 2017-05-25 ENCOUNTER — Telehealth: Payer: Self-pay

## 2017-05-25 NOTE — Telephone Encounter (Signed)
Called patient with no answer. No vm to leave message.

## 2017-05-25 NOTE — Telephone Encounter (Signed)
Patient called stating she had seen on the news where there were recalls on Losartan and she is on this medication. She wants to know if she should continue taking this medication. Please call back. [phone]

## 2017-05-28 NOTE — Telephone Encounter (Signed)
Left generic message on cell phone requesting return call. Left generic message with mother asking for a return call.

## 2017-05-28 NOTE — Telephone Encounter (Signed)
The pharmacist will notify her if there is an issue with her losartan being part of the recalled lot. Please advise

## 2017-06-01 NOTE — Telephone Encounter (Signed)
Left generic message with daughter requesting call back.

## 2017-06-19 ENCOUNTER — Other Ambulatory Visit: Payer: Self-pay | Admitting: Family Medicine

## 2017-06-19 DIAGNOSIS — Z1231 Encounter for screening mammogram for malignant neoplasm of breast: Secondary | ICD-10-CM

## 2017-08-13 ENCOUNTER — Ambulatory Visit (HOSPITAL_COMMUNITY)
Admission: RE | Admit: 2017-08-13 | Discharge: 2017-08-13 | Disposition: A | Discharge status: Home / Self Care | Payer: PRIVATE HEALTH INSURANCE | Source: Ambulatory Visit | Attending: Family Medicine | Admitting: Family Medicine

## 2017-08-13 DIAGNOSIS — Z1231 Encounter for screening mammogram for malignant neoplasm of breast: Secondary | ICD-10-CM

## 2017-08-21 ENCOUNTER — Other Ambulatory Visit: Payer: Self-pay | Admitting: Family Medicine

## 2017-09-16 ENCOUNTER — Other Ambulatory Visit: Payer: Self-pay | Admitting: Family Medicine

## 2017-09-19 LAB — HM DIABETES EYE EXAM

## 2017-10-11 ENCOUNTER — Other Ambulatory Visit: Payer: Self-pay | Admitting: Family Medicine

## 2017-10-22 ENCOUNTER — Ambulatory Visit: Payer: PRIVATE HEALTH INSURANCE | Admitting: Family Medicine

## 2017-10-23 LAB — COMPLETE METABOLIC PANEL WITH GFR
AG RATIO: 1.3 (calc) (ref 1.0–2.5)
ALBUMIN MSPROF: 4.1 g/dL (ref 3.6–5.1)
ALT: 31 U/L — ABNORMAL HIGH (ref 6–29)
AST: 28 U/L (ref 10–35)
Alkaline phosphatase (APISO): 60 U/L (ref 33–115)
BUN: 15 mg/dL (ref 7–25)
CALCIUM: 9.5 mg/dL (ref 8.6–10.2)
CO2: 27 mmol/L (ref 20–32)
Chloride: 99 mmol/L (ref 98–110)
Creat: 0.77 mg/dL (ref 0.50–1.10)
GFR, EST NON AFRICAN AMERICAN: 91 mL/min/{1.73_m2} (ref >=60)
GFR, Est African American: 106 mL/min/{1.73_m2} (ref >=60)
GLOBULIN: 3.1 g/dL (ref 1.9–3.7)
Glucose, Bld: 226 mg/dL — ABNORMAL HIGH (ref 65–99)
POTASSIUM: 4.2 mmol/L (ref 3.5–5.3)
SODIUM: 136 mmol/L (ref 135–146)
TOTAL PROTEIN: 7.2 g/dL (ref 6.1–8.1)
Total Bilirubin: 0.5 mg/dL (ref 0.2–1.2)

## 2017-10-23 LAB — HEMOGLOBIN A1C
EAG (MMOL/L): 10.3 (calc)
Hgb A1c MFr Bld: 8.1 % of total Hgb — ABNORMAL HIGH (ref <5.7)
Mean Plasma Glucose: 186 (calc)

## 2017-10-25 ENCOUNTER — Other Ambulatory Visit: Payer: Self-pay | Admitting: Family Medicine

## 2017-10-30 ENCOUNTER — Ambulatory Visit: Payer: PRIVATE HEALTH INSURANCE | Admitting: Family Medicine

## 2017-12-13 ENCOUNTER — Ambulatory Visit (INDEPENDENT_AMBULATORY_CARE_PROVIDER_SITE_OTHER): Payer: No Typology Code available for payment source | Admitting: Family Medicine

## 2017-12-13 ENCOUNTER — Encounter: Payer: Self-pay | Admitting: Family Medicine

## 2017-12-13 VITALS — BP 175/94 | HR 95 | Resp 12 | Ht 63.0 in | Wt 359.0 lb

## 2017-12-13 DIAGNOSIS — Z794 Long term (current) use of insulin: Secondary | ICD-10-CM

## 2017-12-13 DIAGNOSIS — E1121 Type 2 diabetes mellitus with diabetic nephropathy: Secondary | ICD-10-CM | POA: Diagnosis not present

## 2017-12-13 DIAGNOSIS — J209 Acute bronchitis, unspecified: Secondary | ICD-10-CM

## 2017-12-13 DIAGNOSIS — I10 Essential (primary) hypertension: Secondary | ICD-10-CM | POA: Diagnosis not present

## 2017-12-13 DIAGNOSIS — E785 Hyperlipidemia, unspecified: Secondary | ICD-10-CM

## 2017-12-13 MED ORDER — SITAGLIPTIN PHOSPHATE 100 MG PO TABS: 100.0000 mg | ORAL_TABLET | Freq: Every day | ORAL | 3 refills | Status: DC

## 2017-12-13 MED ORDER — GABAPENTIN 300 MG PO CAPS: 300.0000 mg | ORAL_CAPSULE | Freq: Every day | ORAL | 3 refills | Status: DC

## 2017-12-13 MED ORDER — PRAVASTATIN SODIUM 20 MG PO TABS: 20.0000 mg | ORAL_TABLET | Freq: Every day | ORAL | 3 refills | Status: DC

## 2017-12-13 MED ORDER — LOSARTAN POTASSIUM 50 MG PO TABS: 50.0000 mg | ORAL_TABLET | Freq: Every day | ORAL | 3 refills | Status: DC

## 2017-12-13 MED ORDER — GLIMEPIRIDE 4 MG PO TABS: ORAL_TABLET | ORAL | 3 refills | Status: DC

## 2017-12-13 MED ORDER — BENZONATATE 100 MG PO CAPS: 100.0000 mg | ORAL_CAPSULE | Freq: Two times a day (BID) | ORAL | 0 refills | Status: DC | PRN

## 2017-12-13 MED ORDER — HYDROCHLOROTHIAZIDE 25 MG PO TABS: 25.0000 mg | ORAL_TABLET | Freq: Every day | ORAL | 3 refills | Status: DC

## 2017-12-13 MED ORDER — PENICILLIN V POTASSIUM 500 MG PO TABS: 500.0000 mg | ORAL_TABLET | Freq: Three times a day (TID) | ORAL | 0 refills | Status: DC

## 2017-12-13 NOTE — Progress Notes (Signed)
Isabella Matthews     MRN: [id number]      DOB: 1970/12/17   HPI Ms. Isabella Matthews is here for follow up and re-evaluation of chronic medical conditions, medication management and review of any available recent lab and radiology data.  3 day  h/o worsening head and chest congestion,not  associated with fever and she does c/o  chills intermittently. Nasal drainage has thickened , and is yellowish green, and at times bloody. Sputum is thick and yellow. C/o bilateral ear pressure, denies hearing loss and sore throat. Increasing fatigue , poor appetitie and sleep disturbed by cough. No improvement with OTC medication.   ROS Denies chest pains, palpitations and leg swelling Denies abdominal pain, nausea, vomiting,diarrhea or constipation.   Denies dysuria, frequency, hesitancy or incontinence. Denies joint pain, swelling and limitation in mobility. Denies headaches, seizures, numbness, or tingling. Denies depression, anxiety or insomnia. Denies skin break down or rash.   PE  BP (!) 175/94 (BP Location: Right Arm, Patient Position: Sitting)   Pulse 95   Resp 12   Ht 5\' 3"  (1.6 m)   Wt (!) 359 lb (162.8 kg)   SpO2 96% Comment: room air  BMI 63.59 kg/m   Patient alert and oriented and in no cardiopulmonary distress.  HEENT: No facial asymmetry, EOMI,   oropharynx pink and moist.  Neck supple no JVD, no mass.No sinus tenderness, TM clear   Chest: decreased air entry with scattered crackles and wheezes CVS: S1, S2 no murmurs, no S3.Regular rate.  ABD: Soft non tender.   Ext: No edema  MS: Adequate ROM spine, shoulders, hips and knees.  Skin: Intact, no ulcerations or rash noted.  Psych: Good eye contact, normal affect. Memory intact not anxious or depressed appearing.  CNS: CN 2-12 intact, power,  normal throughout.no focal deficits noted.   Assessment & Plan  Acute bronchitis Acute infection, antibiotic and decongestant prescribed, she will use sugar fee oTC cough  suppressant  MORBID OBESITY Deteriorated. Patient re-educated about  the importance of commitment to a  minimum of 150 minutes of exercise per week.  The importance of healthy food choices with portion control discussed. Encouraged to start a food diary, count calories and to consider  joining a support group. Sample diet sheets offered. Goals set by the patient for the next several months.   Weight /BMI 12/13/2017 05/16/2017 10/24/2016  WEIGHT 359 lb 355 lb 1.9 oz 346 lb  HEIGHT 5\' 3"  5\' 5"  5\' 5"   BMI 63.59 kg/m2 59.1 kg/m2 57.58 kg/m2       Type 2 diabetes mellitus with diabetic nephropathy Uncontrolled, needs to take medication as prescribed and follow diet Ms. Isabella Matthews is reminded of the importance of commitment to daily physical activity for 30 minutes or more, as able and the need to limit carbohydrate intake to 30 to 60 grams per meal to help with blood sugar control.   The need to take medication as prescribed, test blood sugar as directed, and to call between visits if there is a concern that blood sugar is uncontrolled is also discussed.   Ms. Isabella Matthews is reminded of the importance of daily foot exam, annual eye examination, and good blood sugar, blood pressure and cholesterol control.  Diabetic Labs Latest Ref Rng & Units 10/22/2017 05/11/2017 01/27/2017 10/16/2016 06/27/2016  HbA1c <5.7 % of total Hgb 8.1(H) 7.3(H) 7.1(H) 8.3(H) 8.2(H)  Microalbumin mg/dL - - 40.933.3 - -  Micro/Creat Ratio <30 mcg/mg creat - - 188(H) - -  Chol <200  mg/dL - - 161 - -  HDL >09 mg/dL - - 80 - -  Calc LDL mg/dL (calc) - - 81 - -  Triglycerides <150 mg/dL - - 604 - -  Creatinine 0.50 - 1.10 mg/dL 5.40 9.81 1.91 4.78 2.95   BP/Weight 12/13/2017 05/16/2017 10/24/2016 07/03/2016 06/29/2016 03/20/2016 12/20/2015  Systolic BP 175 160 122 124 146 130 138  Diastolic BP 94 90 78 80 73 84 86  Wt. (Lbs) 359 355.12 346 344.04 330 348 356  BMI 63.59 59.1 57.58 57.25 58.46 57.91 59.24   Foot/eye exam completion  dates Latest Ref Rng & Units 09/19/2017 05/16/2017  Eye Exam No Retinopathy No Retinopathy -  Foot exam Order - - -  Foot Form Completion - - Done        Essential hypertension Uncontrolled, has recently been non compliant with medication prescribed DASH diet and commitment to daily physical activity for a minimum of 30 minutes discussed and encouraged, as a part of hypertension management. The importance of attaining a healthy weight is also discussed.  BP/Weight 12/13/2017 05/16/2017 10/24/2016 07/03/2016 06/29/2016 03/20/2016 12/20/2015  Systolic BP 175 160 122 124 146 130 138  Diastolic BP 94 90 78 80 73 84 86  Wt. (Lbs) 359 355.12 346 344.04 330 348 356  BMI 63.59 59.1 57.58 57.25 58.46 57.91 59.24  Importance of good blood pressure control is re emphasized at visit

## 2017-12-13 NOTE — Patient Instructions (Signed)
F/U in 3 months, call if you need me before  Penicillin and a decongestant are prescribed for acute bronchitis Use sugar free robitussin DM OTC for cough suppression and oTC monistat if you develop vaginal yeast infection  NEED opressure medication , fill Blood sugar has increased, PLEASE avoid sugar  Fasting lipid, cmp and eGFr, hBA1C 1 week before next visit  It is important that you exercise regularly at least 30 minutes 5 times a week. If you develop chest pain, have severe difficulty breathing, or feel very tired, stop exercising immediately and seek medical attention    Please work on good  health habits so that your health will improve. 1. Commitment to daily physical activity for 30 to 60  minutes, if you are able to do this.  2. Commitment to wise food choices. Aim for half of your  food intake to be vegetable and fruit, one quarter starchy foods, and one quarter protein. Try to eat on a regular schedule  3 meals per day, snacking between meals should be limited to vegetables or fruits or small portions of nuts. 64 ounces of water per day is generally recommended, unless you have specific health conditions, like heart failure or kidney failure where you will need to limit fluid intake.  3. Commitment to sufficient and a  good quality of physical and mental rest daily, generally between 6 to 8 hours per day.  WITH PERSISTANCE AND PERSEVERANCE, THE IMPOSSIBLE , BECOMES THE NORM!

## 2017-12-15 ENCOUNTER — Encounter: Payer: Self-pay | Admitting: Family Medicine

## 2017-12-15 NOTE — Assessment & Plan Note (Signed)
Uncontrolled, needs to take medication as prescribed and follow diet Ms. Isabella Matthews is reminded of the importance of commitment to daily physical activity for 30 minutes or more, as able and the need to limit carbohydrate intake to 30 to 60 grams per meal to help with blood sugar control.   The need to take medication as prescribed, test blood sugar as directed, and to call between visits if there is a concern that blood sugar is uncontrolled is also discussed.   Ms. Isabella Matthews is reminded of the importance of daily foot exam, annual eye examination, and good blood sugar, blood pressure and cholesterol control.  Diabetic Labs Latest Ref Rng & Units 10/22/2017 05/11/2017 01/27/2017 10/16/2016 06/27/2016  HbA1c <5.7 % of total Hgb 8.1(H) 7.3(H) 7.1(H) 8.3(H) 8.2(H)  Microalbumin mg/dL - - 29.533.3 - -  Micro/Creat Ratio <30 mcg/mg creat - - 188(H) - -  Chol <200 mg/dL - - 284180 - -  HDL >13>50 mg/dL - - 80 - -  Calc LDL mg/dL (calc) - - 81 - -  Triglycerides <150 mg/dL - - 244101 - -  Creatinine 0.50 - 1.10 mg/dL 0.100.77 2.720.84 5.360.62 6.440.69 0.340.99   BP/Weight 12/13/2017 05/16/2017 10/24/2016 07/03/2016 06/29/2016 03/20/2016 12/20/2015  Systolic BP 175 160 122 124 146 130 138  Diastolic BP 94 90 78 80 73 84 86  Wt. (Lbs) 359 355.12 346 344.04 330 348 356  BMI 63.59 59.1 57.58 57.25 58.46 57.91 59.24   Foot/eye exam completion dates Latest Ref Rng & Units 09/19/2017 05/16/2017  Eye Exam No Retinopathy No Retinopathy -  Foot exam Order - - -  Foot Form Completion - - Done

## 2017-12-15 NOTE — Assessment & Plan Note (Signed)
Deteriorated. Patient re-educated about  the importance of commitment to a  minimum of 150 minutes of exercise per week.  The importance of healthy food choices with portion control discussed. Encouraged to start a food diary, count calories and to consider  joining a support group. Sample diet sheets offered. Goals set by the patient for the next several months.   Weight /BMI 12/13/2017 05/16/2017 10/24/2016  WEIGHT 359 lb 355 lb 1.9 oz 346 lb  HEIGHT 5\' 3"  5\' 5"  5\' 5"   BMI 63.59 kg/m2 59.1 kg/m2 57.58 kg/m2

## 2017-12-15 NOTE — Assessment & Plan Note (Signed)
Acute infection, antibiotic and decongestant prescribed, she will use sugar fee oTC cough suppressant

## 2017-12-15 NOTE — Assessment & Plan Note (Signed)
Uncontrolled, has recently been non compliant with medication prescribed DASH diet and commitment to daily physical activity for a minimum of 30 minutes discussed and encouraged, as a part of hypertension management. The importance of attaining a healthy weight is also discussed.  BP/Weight 12/13/2017 05/16/2017 10/24/2016 07/03/2016 06/29/2016 03/20/2016 12/20/2015  Systolic BP 175 160 122 124 146 130 138  Diastolic BP 94 90 78 80 73 84 86  Wt. (Lbs) 359 355.12 346 344.04 330 348 356  BMI 63.59 59.1 57.58 57.25 58.46 57.91 59.24  Importance of good blood pressure control is re emphasized at visit

## 2017-12-17 ENCOUNTER — Other Ambulatory Visit: Payer: Self-pay

## 2017-12-17 ENCOUNTER — Other Ambulatory Visit: Payer: Self-pay | Admitting: Family Medicine

## 2017-12-17 ENCOUNTER — Telehealth: Payer: Self-pay | Admitting: Family Medicine

## 2017-12-17 ENCOUNTER — Emergency Department (HOSPITAL_COMMUNITY)
Admission: EM | Admit: 2017-12-17 | Discharge: 2017-12-17 | Disposition: A | Discharge status: Home / Self Care | Payer: No Typology Code available for payment source | Attending: Emergency Medicine | Admitting: Emergency Medicine

## 2017-12-17 ENCOUNTER — Encounter (HOSPITAL_COMMUNITY): Payer: Self-pay | Admitting: Emergency Medicine

## 2017-12-17 DIAGNOSIS — I10 Essential (primary) hypertension: Secondary | ICD-10-CM | POA: Diagnosis not present

## 2017-12-17 DIAGNOSIS — L03115 Cellulitis of right lower limb: Secondary | ICD-10-CM | POA: Insufficient documentation

## 2017-12-17 DIAGNOSIS — E785 Hyperlipidemia, unspecified: Secondary | ICD-10-CM | POA: Insufficient documentation

## 2017-12-17 DIAGNOSIS — Z79899 Other long term (current) drug therapy: Secondary | ICD-10-CM | POA: Diagnosis not present

## 2017-12-17 DIAGNOSIS — E119 Type 2 diabetes mellitus without complications: Secondary | ICD-10-CM | POA: Diagnosis not present

## 2017-12-17 DIAGNOSIS — Z794 Long term (current) use of insulin: Secondary | ICD-10-CM | POA: Diagnosis not present

## 2017-12-17 DIAGNOSIS — Z87891 Personal history of nicotine dependence: Secondary | ICD-10-CM | POA: Insufficient documentation

## 2017-12-17 DIAGNOSIS — R2241 Localized swelling, mass and lump, right lower limb: Secondary | ICD-10-CM | POA: Diagnosis present

## 2017-12-17 LAB — CBC WITH DIFFERENTIAL/PLATELET
Abs Immature Granulocytes: 0.04 10*3/uL (ref 0.00–0.07)
BASOS PCT: 0 %
Basophils Absolute: 0 10*3/uL (ref 0.0–0.1)
Eosinophils Absolute: 0.2 10*3/uL (ref 0.0–0.5)
Eosinophils Relative: 3 %
HCT: 38.1 % (ref 36.0–46.0)
Hemoglobin: 12.4 g/dL (ref 12.0–15.0)
Immature Granulocytes: 1 %
Lymphocytes Relative: 17 %
Lymphs Abs: 1.3 10*3/uL (ref 0.7–4.0)
MCH: 31.2 pg (ref 26.0–34.0)
MCHC: 32.5 g/dL (ref 30.0–36.0)
MCV: 96 fL (ref 80.0–100.0)
MONO ABS: 1.2 10*3/uL — AB (ref 0.1–1.0)
MONOS PCT: 16 %
NEUTROS ABS: 4.6 10*3/uL (ref 1.7–7.7)
Neutrophils Relative %: 63 %
PLATELETS: 238 10*3/uL (ref 150–400)
RBC: 3.97 MIL/uL (ref 3.87–5.11)
RDW: 12.5 % (ref 11.5–15.5)
WBC: 7.4 10*3/uL (ref 4.0–10.5)
nRBC: 0 % (ref 0.0–0.2)

## 2017-12-17 LAB — BASIC METABOLIC PANEL
Anion gap: 10 (ref 5–15)
BUN: 7 mg/dL (ref 6–20)
CHLORIDE: 100 mmol/L (ref 98–111)
CO2: 26 mmol/L (ref 22–32)
CREATININE: 0.6 mg/dL (ref 0.44–1.00)
Calcium: 9.6 mg/dL (ref 8.9–10.3)
GFR calc non Af Amer: >60 mL/min (ref >60)
GLUCOSE: 114 mg/dL — AB (ref 70–99)
Potassium: 3.7 mmol/L (ref 3.5–5.1)
Sodium: 136 mmol/L (ref 135–145)

## 2017-12-17 LAB — I-STAT BETA HCG BLOOD, ED (MC, WL, AP ONLY)

## 2017-12-17 MED ORDER — CLINDAMYCIN HCL 150 MG PO CAPS: ORAL_CAPSULE | ORAL | 0 refills | Status: DC

## 2017-12-17 MED ORDER — DOXYCYCLINE HYCLATE 100 MG PO TABS: 100.0000 mg | ORAL_TABLET | Freq: Two times a day (BID) | ORAL | 0 refills | Status: DC

## 2017-12-17 NOTE — Progress Notes (Unsigned)
doxycycline

## 2017-12-17 NOTE — Telephone Encounter (Signed)
I spoke wit hte pt and advised her to stop the PCN, I prescribed doxycycline and advised eD evaluation because her main c/o one swollen red calf

## 2017-12-17 NOTE — ED Provider Notes (Signed)
[facility] EMERGENCY DEPARTMENT Provider Note   CSN: [id number] Arrival date & time: 12/17/17  1258     History   Chief Complaint Chief Complaint  Patient presents with  . Leg Problem    HPI Isabella Matthews is a 47 y.o. female.     Pt was seen at 1525. Per pt, c/o gradual onset and persistence of constant right lower leg "redness" that began 2 days ago. Has been associated with "warmth." Pt [facility], rx PCN and questions if this is an allergic reaction to PCN. Denies objective fevers, no injury, no focal motor weakness, no tingling/numbness in extremities, no open wounds, no generalized rash, no SOB/wheezing.    Past Medical History:  Diagnosis Date  . Aortic stenosis, mild   . Aortic valve disorders   . Diabetes mellitus   . Edema   . Essential hypertension   . Morbid obesity (HCC)   . Onychomycosis of toenail     Patient Active Problem List   Diagnosis Date Noted  . Acute bronchitis 12/13/2017  . Carpal tunnel syndrome 07/09/2015  . Osteoarthritis of both knees 11/21/2014  . Allergic rhinitis 07/12/2012  . Hyperlipidemia LDL goal <100 05/06/2009  . Dermatomycosis 04/05/2009  . SLEEP APNEA 04/05/2009  . HEMORRHOIDS 08/22/2007  . PROTEINURIA 08/22/2007  . Essential hypertension 04/24/2006  . ONYCHOMYCOSIS, TOENAILS 03/05/2006  . Type 2 diabetes mellitus with diabetic nephropathy (HCC) 03/05/2006  . MORBID OBESITY 01/02/2006    Past Surgical History:  Procedure Laterality Date  . CESAREAN SECTION     x2     OB History   None      Home Medications    Prior to Admission medications   Medication Sig Start Date End Date Taking? Authorizing Provider  benzonatate (TESSALON) 100 MG capsule Take 1 capsule (100 mg total) by mouth 2 (two) times daily as needed for cough. 12/13/17   Kerri PerchesSimpson, Margaret E, MD  doxycycline (VIBRA-TABS) 100 MG tablet Take 1 tablet (100 mg total) by mouth 2 (two) times daily. 12/17/17    Kerri PerchesSimpson, Margaret E, MD  gabapentin (NEURONTIN) 300 MG capsule Take 1 capsule (300 mg total) by mouth at bedtime. 12/13/17   Kerri PerchesSimpson, Margaret E, MD  glimepiride (AMARYL) 4 MG tablet TAKE ONE TABLET (4MG ) BY MOUTH TWO TIMES A DAY 12/13/17   Kerri PerchesSimpson, Margaret E, MD  hydrochlorothiazide (HYDRODIURIL) 25 MG tablet Take 1 tablet (25 mg total) by mouth daily. 12/13/17   Kerri PerchesSimpson, Margaret E, MD  LANTUS SOLOSTAR 100 UNIT/ML Solostar Pen INJECT 70 UNITS SUBCUTANEOUSLY ONCE DAILY AT 10PM 08/21/17   Kerri PerchesSimpson, Margaret E, MD  losartan (COZAAR) 50 MG tablet Take 1 tablet (50 mg total) by mouth daily. 12/13/17   Kerri PerchesSimpson, Margaret E, MD  nystatin (MYCOSTATIN/NYSTOP) powder Apply to affected area(s) four times daily for 1 week , then as needed, for rash Patient not taking: Reported on 12/13/2017 05/23/17   Kerri PerchesSimpson, Margaret E, MD  nystatin (MYCOSTATIN/NYSTOP) powder APPLY TO AFFECTED AREA(S) FOUR TIMES DAILY FOR 1 WEEK, THEN AS NEEDED, FOR RASH 10/27/17   Kerri PerchesSimpson, Margaret E, MD  penicillin v potassium (VEETID) 500 MG tablet Take 1 tablet (500 mg total) by mouth 3 (three) times daily. 12/13/17   Kerri PerchesSimpson, Margaret E, MD  pravastatin (PRAVACHOL) 20 MG tablet Take 1 tablet (20 mg total) by mouth daily. 12/13/17   Kerri PerchesSimpson, Margaret E, MD  sitaGLIPtin (JANUVIA) 100 MG tablet Take 1 tablet (100 mg total) by mouth daily.  12/13/17   Kerri Perches, MD    Family History Family History  Problem Relation Age of Onset  . Hypertension Mother   . Hypertension Father   . Diabetes Father     Social History Social History   Tobacco Use  . Smoking status: Former Smoker    Packs/day: 0.50    Types: Cigarettes    Last attempt to quit: 11/23/2014    Years since quitting: 3.0  . Smokeless tobacco: Never Used  Substance Use Topics  . Alcohol use: Yes    Alcohol/week: 0.0 standard drinks    Comment: occasionally  . Drug use: No     Allergies   Penicillins; Ace inhibitors; Metformin and related; and  Tramadol   Review of Systems Review of Systems ROS: Statement: All systems negative except as marked or noted in the HPI; Constitutional: Negative for fever and chills. ; ; Eyes: Negative for eye pain, redness and discharge. ; ; ENMT: Negative for ear pain, hoarseness, nasal congestion, sinus pressure and sore throat. ; ; Cardiovascular: Negative for chest pain, palpitations, diaphoresis, dyspnea and peripheral edema. ; ; Respiratory: Negative for cough, wheezing and stridor. ; ; Gastrointestinal: Negative for nausea, vomiting, diarrhea, abdominal pain, blood in stool, hematemesis, jaundice and rectal bleeding. . ; ; Genitourinary: Negative for dysuria, flank pain and hematuria. ; ; Musculoskeletal: Negative for back pain and neck pain. Negative for swelling and trauma.; ; Skin: +rash. Negative for pruritus, abrasions, blisters, bruising and skin lesion.; ; Neuro: Negative for headache, lightheadedness and neck stiffness. Negative for weakness, altered level of consciousness, altered mental status, extremity weakness, paresthesias, involuntary movement, seizure and syncope.       Physical Exam Updated Vital Signs BP (!) 161/76 (BP Location: Right Arm)   Pulse 96   Temp 98.6 F (37 C) (Oral)   Resp 18   Ht 5\' 3"  (1.6 m)   Wt (!) 160.6 kg   SpO2 95%   BMI 62.71 kg/m   Physical Exam 1530: Physical examination:  Nursing notes reviewed; Vital signs and O2 SAT reviewed;  Constitutional: Well developed, Well nourished, Well hydrated, In no acute distress; Head:  Normocephalic, atraumatic; Eyes: EOMI, PERRL, No scleral icterus; ENMT: Mouth and pharynx normal, Mucous membranes moist; Neck: Supple, Full range of motion, No lymphadenopathy; Cardiovascular: Regular rate and rhythm, No gallop; Respiratory: Breath sounds clear & equal bilaterally, No wheezes.  Speaking full sentences with ease, Normal respiratory effort/excursion; Chest: Nontender, Movement normal; Abdomen: Soft, Nontender, Nondistended,  Normal bowel sounds; Genitourinary: No CVA tenderness; Extremities: Peripheral pulses normal, No deformity. +erythema and warmth to right lower anterior tibial area, no open wounds, no streaking, no blisters, no soft tissue crepitus. NT right knee/ankle/foot, muscles compartments soft.. +1 pedal edema bilat. No calf tenderness, edema or asymmetry.; Neuro: AA&Ox3, Major CN grossly intact.  Speech clear. No gross focal motor or sensory deficits in extremities.; Skin: Color normal, Warm, Dry. No generalized rash.    ED Treatments / Results  Labs (all labs ordered are listed, but only abnormal results are displayed)   EKG None  Radiology   Procedures Procedures (including critical care time)  Medications Ordered in ED Medications - No data to display   Initial Impression / Assessment and Plan / ED Course  I have reviewed the triage vital signs and the nursing notes.  Pertinent labs & imaging results that were available during my care of the patient were reviewed by me and considered in my medical decision making (see chart for details).  MDM Reviewed: previous chart, nursing note and vitals Reviewed previous: labs Interpretation: labs    Results for orders placed or performed during the hospital encounter of 12/17/17  Basic metabolic panel  Result Value Ref Range   Sodium 136 135 - 145 mmol/L   Potassium 3.7 3.5 - 5.1 mmol/L   Chloride 100 98 - 111 mmol/L   CO2 26 22 - 32 mmol/L   Glucose, Bld 114 (H) 70 - 99 mg/dL   BUN 7 6 - 20 mg/dL   Creatinine, Ser 1.61 0.44 - 1.00 mg/dL   Calcium 9.6 8.9 - 09.6 mg/dL   GFR calc non Af Amer >60 >60 mL/min   GFR calc Af Amer >60 >60 mL/min   Anion gap 10 5 - 15  CBC with Differential  Result Value Ref Range   WBC 7.4 4.0 - 10.5 K/uL   RBC 3.97 3.87 - 5.11 MIL/uL   Hemoglobin 12.4 12.0 - 15.0 g/dL   HCT 04.5 40.9 - 81.1 %   MCV 96.0 80.0 - 100.0 fL   MCH 31.2 26.0 - 34.0 pg   MCHC 32.5 30.0 - 36.0 g/dL   RDW 91.4 78.2 - 95.6 %    Platelets 238 150 - 400 K/uL   nRBC 0.0 0.0 - 0.2 %   Neutrophils Relative % 63 %   Neutro Abs 4.6 1.7 - 7.7 K/uL   Lymphocytes Relative 17 %   Lymphs Abs 1.3 0.7 - 4.0 K/uL   Monocytes Relative 16 %   Monocytes Absolute 1.2 (H) 0.1 - 1.0 K/uL   Eosinophils Relative 3 %   Eosinophils Absolute 0.2 0.0 - 0.5 K/uL   Basophils Relative 0 %   Basophils Absolute 0.0 0.0 - 0.1 K/uL   Immature Granulocytes 1 %   Abs Immature Granulocytes 0.04 0.00 - 0.07 K/uL  I-Stat beta hCG blood, ED  Result Value Ref Range   I-stat hCG, quantitative <5.0 <5 mIU/mL   Comment 3            1800:  Workup reassuring. Remains afebrile. Tx abx; f/u PMD. Dx and testing d/w pt.  Questions answered.  Verb understanding, agreeable to d/c home with outpt f/u.     Final Clinical Impressions(s) / ED Diagnoses   Final diagnoses:  None    ED Discharge Orders    None       Samuel Jester, DO 12/20/17 1647

## 2017-12-17 NOTE — Progress Notes (Signed)
Doxycycline

## 2017-12-17 NOTE — Discharge Instructions (Signed)
Take the new prescription as directed.  Call your regular medical doctor tomorrow to schedule a follow up appointment within the next 3 days.  Return to the Emergency Department immediately sooner if worsening.  ° °

## 2017-12-17 NOTE — ED Triage Notes (Signed)
Pt c/o lower RT leg redness that began on Saturday. Area warm to touch. Pt was on penicillin and is concerned it may be a reaction to that.

## 2017-12-17 NOTE — Telephone Encounter (Signed)
Patient states she picked up penicillin from pharmacy on Friday and started taking it. Took 3 doses. Woke up Saturday morning and both feet and right leg up through calf was red, swollen, with heat, and painful to the touch. Nothing else new. No issues breath. Nothing else swollen.

## 2017-12-17 NOTE — Telephone Encounter (Signed)
Pt LVM that she had a bad reaction to the medicine she was given, wants a call back

## 2017-12-17 NOTE — ED Notes (Signed)
MD at bedside. 

## 2017-12-18 MED ORDER — DOXYCYCLINE HYCLATE 100 MG PO TABS: 100.0000 mg | ORAL_TABLET | Freq: Two times a day (BID) | ORAL | 0 refills | Status: DC

## 2018-03-07 ENCOUNTER — Other Ambulatory Visit: Payer: Self-pay

## 2018-03-07 ENCOUNTER — Telehealth: Payer: Self-pay | Admitting: Family Medicine

## 2018-03-07 MED ORDER — INSULIN GLARGINE 100 UNIT/ML SOLOSTAR PEN: PEN_INJECTOR | SUBCUTANEOUS | 3 refills | Status: DC

## 2018-03-07 NOTE — Telephone Encounter (Signed)
Lantus refilled

## 2018-03-07 NOTE — Telephone Encounter (Signed)
Pt is calling in to get a RX for Lantus  To be sent to [facility]

## 2018-03-21 ENCOUNTER — Ambulatory Visit: Payer: No Typology Code available for payment source | Admitting: Family Medicine

## 2018-03-22 LAB — LIPID PANEL
CHOL/HDL RATIO: 2.9 (calc) (ref <5.0)
CHOLESTEROL: 174 mg/dL (ref <200)
HDL: 60 mg/dL (ref >=50)
LDL CHOLESTEROL (CALC): 90 mg/dL
NON-HDL CHOLESTEROL (CALC): 114 mg/dL (ref <130)
Triglycerides: 142 mg/dL (ref <150)

## 2018-03-22 LAB — COMPLETE METABOLIC PANEL WITH GFR
AG RATIO: 1.2 (calc) (ref 1.0–2.5)
ALKALINE PHOSPHATASE (APISO): 69 U/L (ref 31–125)
ALT: 16 U/L (ref 6–29)
AST: 17 U/L (ref 10–35)
Albumin: 4.2 g/dL (ref 3.6–5.1)
BUN: 18 mg/dL (ref 7–25)
CALCIUM: 9.5 mg/dL (ref 8.6–10.2)
CO2: 28 mmol/L (ref 20–32)
Chloride: 101 mmol/L (ref 98–110)
Creat: 0.72 mg/dL (ref 0.50–1.10)
GFR, EST NON AFRICAN AMERICAN: 98 mL/min/{1.73_m2} (ref >=60)
GFR, Est African American: 114 mL/min/{1.73_m2} (ref >=60)
GLOBULIN: 3.4 g/dL (ref 1.9–3.7)
Glucose, Bld: 130 mg/dL — ABNORMAL HIGH (ref 65–99)
POTASSIUM: 4 mmol/L (ref 3.5–5.3)
SODIUM: 138 mmol/L (ref 135–146)
Total Bilirubin: 0.5 mg/dL (ref 0.2–1.2)
Total Protein: 7.6 g/dL (ref 6.1–8.1)

## 2018-03-22 LAB — HEMOGLOBIN A1C
EAG (MMOL/L): 9.5 (calc)
Hgb A1c MFr Bld: 7.6 % of total Hgb — ABNORMAL HIGH (ref <5.7)
MEAN PLASMA GLUCOSE: 171 (calc)

## 2018-03-29 ENCOUNTER — Encounter: Payer: Self-pay | Admitting: Family Medicine

## 2018-03-29 ENCOUNTER — Ambulatory Visit (INDEPENDENT_AMBULATORY_CARE_PROVIDER_SITE_OTHER): Payer: No Typology Code available for payment source | Admitting: Family Medicine

## 2018-03-29 VITALS — BP 130/82 | HR 100 | Resp 15 | Ht 63.0 in | Wt 352.0 lb

## 2018-03-29 DIAGNOSIS — Z794 Long term (current) use of insulin: Secondary | ICD-10-CM

## 2018-03-29 DIAGNOSIS — I10 Essential (primary) hypertension: Secondary | ICD-10-CM

## 2018-03-29 DIAGNOSIS — E1121 Type 2 diabetes mellitus with diabetic nephropathy: Secondary | ICD-10-CM

## 2018-03-29 DIAGNOSIS — E785 Hyperlipidemia, unspecified: Secondary | ICD-10-CM | POA: Diagnosis not present

## 2018-03-29 DIAGNOSIS — Z6841 Body Mass Index (BMI) 40.0 and over, adult: Secondary | ICD-10-CM

## 2018-03-29 NOTE — Patient Instructions (Addendum)
    Thank you for coming into the office today. I appreciate the opportunity to provide you with the care for your health and wellness.  Continue weight loss: diet modifications: replace salted items with unsalted. DASH diet.  Eat more consistently during the day. Drink lots of water (one cup before meals). Include season fresh fruits and veggies.(1/2-3/4 of your meal). Avoid crunch tasty potato chips-consume in moderation.    Get back into a daily routine of working out. WALK IS GREAT FOR YOU AND JASPER!!! Look Artist with Daughters.  Recheck of A1c in mid June.   FOOT CARE: Use warm water and vinegar for soaking. DRY well, use wash cloth for dry skin removal (buffing the area). Apply a layer of vaseline, zinc, or and dimethicone cream. Put socks over this, and then remove socks within 1-2 hours.  Repeat this 2-4 times a week.   Go visit the dentist.  Eye exam due in August  It was a pleasure to see you and I look forward to continuing to work together on your health and well-being. Please do not hesitate to call the office if you need care or have questions about your care.  Have a wonderful day and week.  With Gratitude,  Tereasa Coop, DNP, AGNP-BC  Full

## 2018-03-29 NOTE — Progress Notes (Signed)
Established Patient Office Visit  Subjective:  Patient ID: Isabella Matthews, female    DOB: August 26, 1970  Age: 48 y.o. MRN: [id number]  CC:  Chief Complaint  Patient presents with  . Hypertension    3 month follow up visit- lab review   . Hyperlipidemia  . Diabetes    glucose was 143 this am when checked     HPI Isabella Matthews is a 48 year old female patient of Dr. Anthony Sar, that comes in today for follow-up on her hypertension, hyperlipidemia, and diabetes.  Hypertension: Currently on hydrochlorothiazide, Cozaar. Patient reports taking blood pressure medicine as prescribed.  Does not have any reports of having headaches, vision changes, dizziness, chest pain, palpitations, shortness of breath, leg swelling.  Hyperlipidemia: Currently on lovastatin.  Patient denies having any myopathies or difficulties taking this medication.  Reports that she has not really improved much of her diet.  Lipid profile is good today in the office.  Would like to see her LDL under 70, currently at 90.  Diabetes: Currently on Januvia, Amaryl, and Lantus 80 units daily.  Reports taking this medication as described.  Last A1c of 7.6, improved from 8.1 at the end of last year.  Denies having any polyphagia, polydipsia, poly-urea.  Denies having any blurred vision.  Denies having any signs or symptoms of infection.  Overall today in the office she is doing well.  She would like to see her weight improved.  Has shoes off for her foot examination.  Up-to-date on other health maintenance, care gaps.  Past Medical, Surgical, Social History, Allergies, and Medications have been Reviewed.   Past Medical History:  Diagnosis Date  . Aortic stenosis, mild   . Aortic valve disorders   . Diabetes mellitus   . Edema   . Essential hypertension   . Morbid obesity (HCC)   . Onychomycosis of toenail     Past Surgical History:  Procedure Laterality Date  . CESAREAN SECTION     x2    Family History  Problem  Relation Age of Onset  . Hypertension Mother   . Hypertension Father   . Diabetes Father     Social History   Socioeconomic History  . Marital status: Single    Spouse name: Not on file  . Number of children: Not on file  . Years of education: Not on file  . Highest education level: Not on file  Occupational History  . Occupation: CNA - [employer]   Social Needs  . Financial resource strain: Not on file  . Food insecurity:    Worry: Not on file    Inability: Not on file  . Transportation needs:    Medical: Not on file    Non-medical: Not on file  Tobacco Use  . Smoking status: Former Smoker    Packs/day: 0.50    Types: Cigarettes    Last attempt to quit: 11/23/2014    Years since quitting: 3.3  . Smokeless tobacco: Never Used  Substance and Sexual Activity  . Alcohol use: Yes    Alcohol/week: 0.0 standard drinks    Comment: occasionally  . Drug use: No  . Sexual activity: Not on file  Lifestyle  . Physical activity:    Days per week: Not on file    Minutes per session: Not on file  . Stress: Not on file  Relationships  . Social connections:    Talks on phone: Not on file    Gets together: Not on file  Attends religious service: Not on file    Active member of club or organization: Not on file    Attends meetings of clubs or organizations: Not on file    Relationship status: Not on file  . Intimate partner violence:    Fear of current or ex partner: Not on file    Emotionally abused: Not on file    Physically abused: Not on file    Forced sexual activity: Not on file  Other Topics Concern  . Not on file  Social History Narrative  . Not on file    Outpatient Medications Prior to Visit  Medication Sig Dispense Refill  . gabapentin (NEURONTIN) 300 MG capsule Take 1 capsule (300 mg total) by mouth at bedtime. 90 capsule 3  . glimepiride (AMARYL) 4 MG tablet TAKE ONE TABLET ( ) BY MOUTH TWO TIMES A DAY (Patient taking differently: Take 4 mg by mouth 2 (two)  times daily. ) 90 tablet 3  . hydrochlorothiazide (HYDRODIURIL) 25 MG tablet Take 1 tablet (25 mg total) by mouth daily. 90 tablet 3  . Insulin Glargine (LANTUS SOLOSTAR) 100 UNIT/ML Solostar Pen INJECT 70 UNITS SUBCUTANEOUSLY ONCE DAILY AT 10PM 75 mL 3  . losartan (COZAAR) 50 MG tablet Take 1 tablet (50 mg total) by mouth daily. 90 tablet 3  . nystatin (MYCOSTATIN/NYSTOP) powder APPLY TO AFFECTED AREA(S) FOUR TIMES DAILY FOR 1 WEEK, THEN AS NEEDED, FOR RASH (Patient taking differently: Apply topically as needed (for rash/irritation). ) 60 g 1  . oxymetazoline (AFRIN NODRIP EXTRA MOISTURE) 0.05 % nasal spray Place 1 spray into both nostrils 2 (two) times daily as needed for congestion.    . pravastatin (PRAVACHOL) 20 MG tablet Take 1 tablet (20 mg total) by mouth daily. 90 tablet 3  . sitaGLIPtin (JANUVIA) 100 MG tablet Take 1 tablet (100 mg total) by mouth daily. 90 tablet 3  . doxycycline (VIBRA-TABS) 100 MG tablet Take 1 tablet (100 mg total) by mouth 2 (two) times daily. 20 tablet 0  . benzonatate (TESSALON) 100 MG capsule Take 1 capsule (100 mg total) by mouth 2 (two) times daily as needed for cough. 14 capsule 0  . clindamycin (CLEOCIN) 150 MG capsule 3 tabs PO TID x 10 days 90 capsule 0  . doxycycline (VIBRA-TABS) 100 MG tablet Take 1 tablet (100 mg total) by mouth 2 (two) times daily. 20 tablet 0  . nystatin (MYCOSTATIN/NYSTOP) powder Apply to affected area(s) four times daily for 1 week , then as needed, for rash (Patient not taking: Reported on 12/13/2017) 60 g 1   No facility-administered medications prior to visit.     Allergies  Allergen Reactions  . Penicillins Swelling    Has patient had a PCN reaction causing immediate rash, facial/tongue/throat swelling, SOB or lightheadedness with hypotension: Yes Has patient had a PCN reaction causing severe rash involving mucus membranes or skin necrosis: No Has patient had a PCN reaction that required hospitalization: No Has patient had a  PCN reaction occurring within the last 10 years: Yes If all of the above answers are "NO", then may proceed with Cephalosporin use. Leg swelling/redness  . Ace Inhibitors Cough  . Metformin And Related Nausea Only  . Tramadol Rash    Scant rash on hands  After starting tramadol for knee pain in Jan 16, 2013    ROS Review of Systems  Constitutional: Negative for activity change, appetite change, chills and fever.  HENT:       Need dentist  Eyes:  Eye appt: august  Respiratory: Positive for cough. Negative for choking, chest tightness, shortness of breath and wheezing.        Post residual cough from previous cold.  Cardiovascular: Negative for chest pain, palpitations and leg swelling.  Gastrointestinal: Positive for constipation. Negative for blood in stool.  Endocrine: Negative for polydipsia, polyphagia and polyuria.  Genitourinary: Negative for hematuria, vaginal bleeding, vaginal discharge and vaginal pain.       LMP 2019: unsure- last cycle was very heavy.  Skin: Negative.   Neurological: Negative for dizziness and headaches.  Psychiatric/Behavioral: Negative.   All other systems reviewed and are negative.     Objective:    Physical Exam  Constitutional: She is oriented to person, place, and time. She appears well-developed and well-nourished.  HENT:  Head: Normocephalic.  Right Ear: External ear normal.  Left Ear: External ear normal.  Nose: Nose normal.  Mouth/Throat: Oropharynx is clear and moist.  Eyes: Conjunctivae are normal. Right eye exhibits no discharge. Left eye exhibits no discharge.  Neck: Normal range of motion. Neck supple.  Cardiovascular: Normal rate, regular rhythm and normal heart sounds.  Pulmonary/Chest: Effort normal and breath sounds normal.  Abdominal: Soft. Bowel sounds are normal.  Musculoskeletal: Normal range of motion.  Lymphadenopathy:    She has no cervical adenopathy.  Neurological: She is alert and oriented to person, place,  and time.  Skin: Skin is warm and dry.  Psychiatric: She has a normal mood and affect. Her behavior is normal. Judgment and thought content normal.  Nursing note and vitals reviewed.   BP 140/88   Pulse 100   Resp 15   Ht 5\' 3"  (1.6 m)   Wt (!) 352 lb (159.7 kg)   SpO2 96%   BMI 62.35 kg/m  Wt Readings from Last 3 Encounters:  03/29/18 (!) 352 lb (159.7 kg)  12/17/17 (!) 354 lb (160.6 kg)  12/13/17 (!) 359 lb (162.8 kg)    Lab Results  Component Value Date   TSH 0.96 01/27/2017   Lab Results  Component Value Date   WBC 7.4 12/17/2017   HGB 12.4 12/17/2017   HCT 38.1 12/17/2017   MCV 96.0 12/17/2017   PLT 238 12/17/2017   Lab Results  Component Value Date   NA 138 03/21/2018   K 4.0 03/21/2018   CO2 28 03/21/2018   GLUCOSE 130 (H) 03/21/2018   BUN 18 03/21/2018   CREATININE 0.72 03/21/2018   BILITOT 0.5 03/21/2018   ALKPHOS 52 03/16/2016   AST 17 03/21/2018   ALT 16 03/21/2018   PROT 7.6 03/21/2018   ALBUMIN 3.9 03/16/2016   CALCIUM 9.5 03/21/2018   ANIONGAP 10 12/17/2017   Lab Results  Component Value Date   CHOL 174 03/21/2018   Lab Results  Component Value Date   HDL 60 03/21/2018   Lab Results  Component Value Date   LDLCALC 90 03/21/2018   Lab Results  Component Value Date   TRIG 142 03/21/2018   Lab Results  Component Value Date   CHOLHDL 2.9 03/21/2018   Lab Results  Component Value Date   HGBA1C 7.6 (H) 03/21/2018      Assessment & Plan:   1. Morbid obesity with BMI of 60.0-69.9, adult (HCC) Uncontrolled.  Patient has lost 7 pounds since last November.  Reports that she can do a lot better on her diet.  Really likes potato chips, provided extensive education on how to adjust diet.  Will bring back in a few months  for check of weight.  And continue diabetic education as well.  Encouraged her to get exercising, to a walking routine.  30 minutes, 5 days a week moderate to vigorous exercise.  2. Type 2 diabetes mellitus with diabetic  nephropathy, with long-term current use of insulin (HCC) Currently A1c has improved from previous check.  Detailed diet modifications today in the office.  Encouraged her to get exercising.  Will reassess A1c in 3 months.  Simple foot exam, sensation predominantly intact.  Mild sensation decreased on the bottom of the foot bilaterally.  Detailed foot care, as she does have neuropathy.  - Hemoglobin A1c  3. Hyperlipidemia LDL goal <100 Stable but not at goal.  Due to diabetes needs to be LDL under 70.  Currently at 90.  Educated about the need to have this number lower.  Provided her with education on diet and exercise implementations to help.  4. Essential hypertension Controlled, currently blood pressure is 130/82.  Advised to continue medication regime as ordered.  We will continue to follow.  Educated her on the need to be on DASH diet this will help with not only weight loss, but with blood pressure control.     Follow-up: 06/28/2018   Freddy Finner, NP

## 2018-04-02 ENCOUNTER — Other Ambulatory Visit: Payer: Self-pay

## 2018-04-02 MED ORDER — INSULIN GLARGINE 100 UNIT/ML SOLOSTAR PEN: PEN_INJECTOR | SUBCUTANEOUS | 1 refills | Status: DC

## 2018-06-28 ENCOUNTER — Ambulatory Visit: Payer: No Typology Code available for payment source | Admitting: Family Medicine

## 2018-08-02 ENCOUNTER — Other Ambulatory Visit: Payer: Self-pay | Admitting: Family Medicine

## 2018-08-12 ENCOUNTER — Other Ambulatory Visit: Payer: Self-pay | Admitting: Family Medicine

## 2018-08-30 ENCOUNTER — Ambulatory Visit (INDEPENDENT_AMBULATORY_CARE_PROVIDER_SITE_OTHER): Payer: No Typology Code available for payment source | Admitting: Family Medicine

## 2018-08-30 ENCOUNTER — Other Ambulatory Visit: Payer: Self-pay

## 2018-08-30 ENCOUNTER — Encounter: Payer: Self-pay | Admitting: Family Medicine

## 2018-08-30 VITALS — BP 138/86 | HR 91 | Temp 98.2°F | Resp 14 | Ht 63.0 in | Wt 342.0 lb

## 2018-08-30 DIAGNOSIS — Z794 Long term (current) use of insulin: Secondary | ICD-10-CM

## 2018-08-30 DIAGNOSIS — E1121 Type 2 diabetes mellitus with diabetic nephropathy: Secondary | ICD-10-CM

## 2018-08-30 DIAGNOSIS — I152 Hypertension secondary to endocrine disorders: Secondary | ICD-10-CM

## 2018-08-30 DIAGNOSIS — E1159 Type 2 diabetes mellitus with other circulatory complications: Secondary | ICD-10-CM

## 2018-08-30 DIAGNOSIS — Z6841 Body Mass Index (BMI) 40.0 and over, adult: Secondary | ICD-10-CM

## 2018-08-30 DIAGNOSIS — I10 Essential (primary) hypertension: Secondary | ICD-10-CM

## 2018-08-30 NOTE — Progress Notes (Signed)
Subjective:     Patient ID: Isabella Matthews, female   DOB: 1970/12/14, 48 y.o.   MRN: [id number]  KATANYA SCHLIE presents for Diabetes (3 mth follow up), Hypertension, and weight management  Here for follow-up of hypertension.   is not exercising,   female is not adherent to a low-salt diet.  Blood pressure is not checking at home well controlled at home. Cardiac symptoms: none. Patient denies: chest pain, chest pressure/discomfort, claudication, dyspnea, exertional chest pressure/discomfort, fatigue, irregular heart beat, lower extremity edema, near-syncope, orthopnea, palpitations, paroxysmal nocturnal dyspnea, syncope and tachypnea. Cardiovascular risk factors: diabetes mellitus, dyslipidemia, hypertension, obesity (BMI >= 30 kg/m2) and sedentary lifestyle. Use of agents associated with hypertension: none. History of target organ damage: none.  Denies side effects of current medications. Takes medications as directed.  Has not taken this mornings medication yet.  Diabetes follow-up:  Blood sugars at home are running averaging in the 200's.  Denies hypoglycemia.  Denies polydipsia and polyuria.  Last eye exam was scheduled later this year.  Last foot exam in office was 03/2018 .  Last A1c 7.6. Denies non-healing wounds or rashes. Denies signs of UTI or other infections. Patient follows a low sugar diet and checks feet regularly without concerns. Reports staying hydrated by drinking water. Not eating well at work, overeat when home.  Isabella Matthews is here for follow up regarding weight.   is not following a low fat heart healthy diet. female is not exercising.  Reports drinking 4 cups water daily. Reports sleeping maybe 5 a night, does not sleep well. Reports taking medications as ordered to control  DM, HTN, HLD.   Wt Readings from Last 3 Encounters:  08/30/18 (!) 342 lb (155.1 kg)  03/29/18 (!) 352 lb (159.7 kg)  12/17/17 (!) 354 lb (160.6 kg)   Today patient denies signs and  symptoms of COVID 19 infection including fever, chills, cough, shortness of breath, and headache.  Past Medical, Surgical, Social History, Allergies, and Medications have been Reviewed.  Past Medical History:  Diagnosis Date  . Aortic stenosis, mild   . Aortic valve disorders   . Diabetes mellitus   . Edema   . Essential hypertension   . Morbid obesity ([place])   . Onychomycosis of toenail    Past Surgical History:  Procedure Laterality Date  . CESAREAN SECTION     x2   Social History   Socioeconomic History  . Marital status: Single    Spouse name: Not on file  . Number of children: Not on file  . Years of education: Not on file  . Highest education level: Not on file  Occupational History  . Occupation: CNA - [employer]   Social Needs  . Financial resource strain: Not on file  . Food insecurity    Worry: Not on file    Inability: Not on file  . Transportation needs    Medical: Not on file    Non-medical: Not on file  Tobacco Use  . Smoking status: Former Smoker    Packs/day: 0.50    Types: Cigarettes    Quit date: 11/23/2014    Years since quitting: 3.7  . Smokeless tobacco: Never Used  Substance and Sexual Activity  . Alcohol use: Yes    Alcohol/week: 0.0 standard drinks    Comment: occasionally  . Drug use: No  . Sexual activity: Not on file  Lifestyle  . Physical activity    Days per week: Not on file  Minutes per session: Not on file  . Stress: Not on file  Relationships  . Social Musicianconnections    Talks on phone: Not on file    Gets together: Not on file    Attends religious service: Not on file    Active member of club or organization: Not on file    Attends meetings of clubs or organizations: Not on file    Relationship status: Not on file  . Intimate partner violence    Fear of current or ex partner: Not on file    Emotionally abused: Not on file    Physically abused: Not on file    Forced sexual activity: Not on file  Other Topics Concern  . Not  on file  Social History Narrative  . Not on file    Outpatient Encounter Medications as of 08/30/2018  Medication Sig  . gabapentin (NEURONTIN) 300 MG capsule Take 1 capsule (300 mg total) by mouth at bedtime.  Marland Kitchen. glimepiride (AMARYL) 4 MG tablet Take 1 tablet by mouth twice daily  . hydrochlorothiazide (HYDRODIURIL) 25 MG tablet Take 1 tablet (25 mg total) by mouth daily.  . Insulin Glargine (LANTUS SOLOSTAR) 100 UNIT/ML Solostar Pen INJECT 70 UNITS SUBCUTANEOUSLY ONCE DAILY AT 10PM. Dispense 90- day supply  . losartan (COZAAR) 50 MG tablet Take 1 tablet (50 mg total) by mouth daily.  Marland Kitchen. nystatin (MYCOSTATIN/NYSTOP) powder APPLY TO AFFECTED AREA(S) FOUR TIMES DAILY FOR 1 WEEK, THEN AS NEEDED, FOR RASH (Patient taking differently: Apply topically as needed (for rash/irritation). )  . oxymetazoline (AFRIN NODRIP EXTRA MOISTURE) 0.05 % nasal spray Place 1 spray into both nostrils 2 (two) times daily as needed for congestion.  . pravastatin (PRAVACHOL) 20 MG tablet Take 1 tablet (20 mg total) by mouth daily.  . [DISCONTINUED] sitaGLIPtin (JANUVIA) 100 MG tablet Take 1 tablet (100 mg total) by mouth daily. (Patient not taking: Reported on 08/30/2018)   No facility-administered encounter medications on file as of 08/30/2018.    Allergies  Allergen Reactions  . Penicillins Swelling    Has patient had a PCN reaction causing immediate rash, facial/tongue/throat swelling, SOB or lightheadedness with hypotension: Yes Has patient had a PCN reaction causing severe rash involving mucus membranes or skin necrosis: No Has patient had a PCN reaction that required hospitalization: No Has patient had a PCN reaction occurring within the last 10 years: Yes If all of the above answers are "NO", then may proceed with Cephalosporin use. Leg swelling/redness  . Ace Inhibitors Cough  . Metformin And Related Nausea Only  . Tramadol Rash    Scant rash on hands  After starting tramadol for knee pain in Jan 16, 2013     Review of Systems  Constitutional: Negative for chills and fever.  HENT: Negative.   Eyes: Positive for visual disturbance.  Respiratory: Negative.  Negative for cough and shortness of breath.   Cardiovascular: Negative.  Negative for chest pain, palpitations and leg swelling.  Gastrointestinal: Negative.   Endocrine: Negative.  Negative for polydipsia, polyphagia and polyuria.  Genitourinary: Negative.   Musculoskeletal: Negative.   Skin: Negative.   Allergic/Immunologic: Negative.   Neurological: Negative.  Negative for dizziness and headaches.  Hematological: Negative.   Psychiatric/Behavioral: Positive for sleep disturbance.  All other systems reviewed and are negative.      Objective:     BP (!) 142/90   Pulse 91   Temp 98.2 F (36.8 C) (Temporal)   Resp 14   Ht 5\' 3"  (1.6 m)  Wt (!) 342 lb (155.1 kg)   SpO2 96%   BMI 60.58 kg/m   Physical Exam Vitals signs and nursing note reviewed.  Constitutional:      Appearance: Normal appearance. She is well-developed and well-groomed. She is obese.  HENT:     Head: Normocephalic and atraumatic.     Right Ear: External ear normal.     Left Ear: External ear normal.     Nose: Nose normal.     Mouth/Throat:     Mouth: Mucous membranes are moist.     Pharynx: Oropharynx is clear.  Eyes:     General:        Right eye: No discharge.        Left eye: No discharge.     Conjunctiva/sclera: Conjunctivae normal.  Neck:     Musculoskeletal: Normal range of motion and neck supple.  Cardiovascular:     Rate and Rhythm: Normal rate and regular rhythm.     Pulses: Normal pulses.     Heart sounds: Normal heart sounds.  Pulmonary:     Effort: Pulmonary effort is normal.     Breath sounds: Normal breath sounds.  Musculoskeletal: Normal range of motion.  Skin:    General: Skin is warm.  Neurological:     General: No focal deficit present.     Mental Status: She is alert and oriented to person, place, and time.  Psychiatric:         Attention and Perception: Attention normal.        Mood and Affect: Mood normal.        Speech: Speech normal.        Behavior: Behavior normal. Behavior is cooperative.        Thought Content: Thought content normal.        Cognition and Memory: Cognition normal.        Judgment: Judgment normal.        Assessment and Plan       1. Type 2 diabetes mellitus with diabetic nephropathy, with long-term current use of insulin (HCC) Celesta AverLinda D Rasco is encouraged to check blood sugar daily as directed. Continue current medications. Is on statin as well. Educated on importance of maintain a well balanced diabetic friendly diet. She is reminded the importance of maintaining  good blood sugars,  taking medications as directed, daily foot care, annual eye exams. Additionally educated about keeping good control over blood pressure and cholesterol as well.  I previously ordered an A1c she reports she will get today. Januvia is to costly. Will need to look at medications once we get A1c back.  2. Hypertension associated with diabetes (HCC) Celesta AverLinda D Lafavor is encouraged to maintain a well balanced diet that is low in salt. Borderline Controlled, continue current medication regimen.  No refills needed. Recheck was better. Encourage to take medication prior to visits. She is also reminded that exercise is beneficial for heart health and control of  Blood pressure. 30-60 minutes daily is recommended-walking was suggested.   3. Morbid obesity with BMI of 60.0-69.9, adult (HCC)  Improved-but unsure if wt loss is related to poor DM control. As she reports herself she is not eating well.  Celesta AverLinda D Woodfin is re-educated about the importance of exercise daily to help with weight management. A minumum of 30 minutes daily is recommended. Additionally, importance of healthy food choices  with portion control discussed. Encouraged to start a food diary, count calories and to consider joining a  support group if possible. Diet information sheets offered.  Filed Weights   08/30/18 1044  Weight: (!) 342 lb (155.1 kg)    Follow Up: 1 month 10/01/2018  Freddy FinnerHannah M. , DNP, AGNP-BC [facility] [address], [address] [place], [place] [address] Office Hours: Mon-Thurs 8 am-5 pm; Fri 8 am-12 pm Office Phone:  [phone]  Office Fax: [phone]

## 2018-08-30 NOTE — Patient Instructions (Addendum)
    Thank you for coming into the office today. I appreciate the opportunity to provide you with the care for your health and wellness. Today we discussed: overall health  Follow Up: 1 month for DM, HTN, WT  Labs today  Walk [place] :) :) :)   Find some healthy snacks to eat at work: boiled eggs, low fat cheese, tuna packs, PB an celery At night avoid eating after 8 pm, fix snack at 8 pm of low fat milk and pb and crackers, pb 1/2 sandwich. Drink water only after 8 pm.  Be patient with yourself. :)  Please continue to practice social distancing to keep you, your family, and our community safe.  If you must go out, please wear a Mask and practice good handwashing.   [place] YOUR HANDS WELL AND FREQUENTLY. AVOID TOUCHING YOUR FACE, UNLESS YOUR HANDS ARE FRESHLY WASHED.   GET FRESH AIR DAILY. STAY HYDRATED WITH WATER.   It was a pleasure to see you and I look forward to continuing to work together on your health and well-being. Please do not hesitate to call the office if you need care or have questions about your care.  Have a wonderful day and week. With Gratitude, Cherly Beach, DNP, AGNP-BC

## 2018-09-14 LAB — HEMOGLOBIN A1C
Hgb A1c MFr Bld: 10.5 % of total Hgb — ABNORMAL HIGH (ref <5.7)
Mean Plasma Glucose: 255 (calc)
eAG (mmol/L): 14.1 (calc)

## 2018-09-18 ENCOUNTER — Other Ambulatory Visit (HOSPITAL_COMMUNITY): Payer: Self-pay | Admitting: Family Medicine

## 2018-09-18 DIAGNOSIS — Z1231 Encounter for screening mammogram for malignant neoplasm of breast: Secondary | ICD-10-CM

## 2018-09-20 ENCOUNTER — Other Ambulatory Visit: Payer: Self-pay | Admitting: Family Medicine

## 2018-09-20 DIAGNOSIS — E1121 Type 2 diabetes mellitus with diabetic nephropathy: Secondary | ICD-10-CM

## 2018-09-20 DIAGNOSIS — Z794 Long term (current) use of insulin: Secondary | ICD-10-CM

## 2018-09-20 MED ORDER — LANTUS SOLOSTAR 100 UNIT/ML ~~LOC~~ SOPN: PEN_INJECTOR | SUBCUTANEOUS | 1 refills | Status: DC

## 2018-10-01 ENCOUNTER — Encounter: Payer: Self-pay | Admitting: Family Medicine

## 2018-10-01 ENCOUNTER — Other Ambulatory Visit: Payer: Self-pay

## 2018-10-01 ENCOUNTER — Ambulatory Visit (INDEPENDENT_AMBULATORY_CARE_PROVIDER_SITE_OTHER): Payer: No Typology Code available for payment source | Admitting: Family Medicine

## 2018-10-01 VITALS — BP 136/86 | HR 80 | Temp 98.4°F | Resp 12 | Ht 63.0 in | Wt 340.1 lb

## 2018-10-01 DIAGNOSIS — E1159 Type 2 diabetes mellitus with other circulatory complications: Secondary | ICD-10-CM | POA: Diagnosis not present

## 2018-10-01 DIAGNOSIS — E1121 Type 2 diabetes mellitus with diabetic nephropathy: Secondary | ICD-10-CM

## 2018-10-01 DIAGNOSIS — Z23 Encounter for immunization: Secondary | ICD-10-CM | POA: Diagnosis not present

## 2018-10-01 DIAGNOSIS — Z6841 Body Mass Index (BMI) 40.0 and over, adult: Secondary | ICD-10-CM

## 2018-10-01 DIAGNOSIS — Z794 Long term (current) use of insulin: Secondary | ICD-10-CM

## 2018-10-01 DIAGNOSIS — I10 Essential (primary) hypertension: Secondary | ICD-10-CM

## 2018-10-01 DIAGNOSIS — I152 Hypertension secondary to endocrine disorders: Secondary | ICD-10-CM

## 2018-10-01 NOTE — Patient Instructions (Signed)
    Thank you for coming into the office today. I appreciate the opportunity to provide you with the care for your health and wellness. Today we discussed:  Diabetes  Follow Up:   No labs today, please get labs in 2 weeks- orders placed today  Referral to Dr Dorris Fetch an Endocrinologist placed today  I think you are insulin resistant.  Diet improvement is going to be the best option to get blood sugar back in range.   Please continue your current insulin dosage you have been, and continue to check blood sugars 3-4 times daily.  [place] YOUR HANDS WELL AND FREQUENTLY. AVOID TOUCHING YOUR FACE, UNLESS YOUR HANDS ARE FRESHLY WASHED.  GET FRESH AIR DAILY. STAY HYDRATED WITH WATER.   It was a pleasure to see you and I look forward to continuing to work together on your health and well-being. Please do not hesitate to call the office if you need care or have questions about your care.  Have a wonderful day and week. With Gratitude, Cherly Beach, DNP, AGNP-BC

## 2018-10-01 NOTE — Progress Notes (Signed)
Subjective:     Patient ID: Isabella Matthews, female   DOB: 1971-01-07, 48 y.o.   MRN: [id number]  Isabella Matthews presents for Hypertension (follow up), Obesity, and Diabetes  Diabetes follow-up:  Blood sugars at home are running 87-280. Not well controlled, variable on what she eats she reports. Denies hypoglycemia.  Denies polydipsia and polyuria.  Last eye exam was Dec 2020 .  Last foot exam in office was .  Last A1c 10.5%-09/13/2018. Denies non-healing wounds or rashes. Denies signs of UTI or other infections. Patient follows a low sugar diet and checks feet regularly without concerns. Reports staying hydrated by drinking water.   She misunderstood the insulin instructions over the phone-she was suppose to take 40 units BID, she has been taking 80 units BID. Has not felt bad and denies any sugars under 70. She reports eating hot dogs, chips, noodles, biscuits and sweets. She understands these are not the healthiest. Additionally, she skips meals at times, mainly lunch is the skipped meal.  She also reports not taking her BP medication until she was about to leave her house. So her BP is elevated today as well. She denies S&S of HTN.  Today patient denies signs and symptoms of COVID 19 infection including fever, chills, cough, shortness of breath, and headache.  Past Medical, Surgical, Social History, Allergies, and Medications have been Reviewed.   Past Medical History:  Diagnosis Date   Aortic stenosis, mild    Aortic valve disorders    Diabetes mellitus    Edema    Essential hypertension    Morbid obesity (HCC)    Onychomycosis of toenail    Past Surgical History:  Procedure Laterality Date   CESAREAN SECTION     x2   Social History   Socioeconomic History   Marital status: Single    Spouse name: Not on file   Number of children: Not on file   Years of education: Not on file   Highest education level: Not on file  Occupational History    Occupation: CNA - [employer]   Social Needs   Financial resource strain: Not on file   Food insecurity    Worry: Not on file    Inability: Not on file   Transportation needs    Medical: Not on file    Non-medical: Not on file  Tobacco Use   Smoking status: Former Smoker    Packs/day: 0.50    Types: Cigarettes    Quit date: 11/23/2014    Years since quitting: 3.8   Smokeless tobacco: Never Used  Substance and Sexual Activity   Alcohol use: Yes    Alcohol/week: 0.0 standard drinks    Comment: occasionally   Drug use: No   Sexual activity: Not on file  Lifestyle   Physical activity    Days per week: Not on file    Minutes per session: Not on file   Stress: Not on file  Relationships   Social connections    Talks on phone: Not on file    Gets together: Not on file    Attends religious service: Not on file    Active member of club or organization: Not on file    Attends meetings of clubs or organizations: Not on file    Relationship status: Not on file   Intimate partner violence    Fear of current or ex partner: Not on file    Emotionally abused: Not on file    Physically  abused: Not on file    Forced sexual activity: Not on file  Other Topics Concern   Not on file  Social History Narrative   Not on file    Outpatient Encounter Medications as of 10/01/2018  Medication Sig   gabapentin (NEURONTIN) 300 MG capsule Take 1 capsule (300 mg total) by mouth at bedtime.   glimepiride (AMARYL) 4 MG tablet Take 1 tablet by mouth twice daily   hydrochlorothiazide (HYDRODIURIL) 25 MG tablet Take 1 tablet (25 mg total) by mouth daily.   Insulin Glargine (LANTUS SOLOSTAR) 100 UNIT/ML Solostar Pen Inject 40 units subcutaneously twice daily at 10 am and 10 pm.  Needs to be given 12 hours apart. Dispense 90- day supply   losartan (COZAAR) 50 MG tablet Take 1 tablet (50 mg total) by mouth daily.   nystatin (MYCOSTATIN/NYSTOP) powder APPLY TO AFFECTED AREA(S) FOUR TIMES  DAILY FOR 1 WEEK, THEN AS NEEDED, FOR RASH (Patient taking differently: Apply topically as needed (for rash/irritation). )   oxymetazoline (AFRIN NODRIP EXTRA MOISTURE) 0.05 % nasal spray Place 1 spray into both nostrils 2 (two) times daily as needed for congestion.   pravastatin (PRAVACHOL) 20 MG tablet Take 1 tablet (20 mg total) by mouth daily.   No facility-administered encounter medications on file as of 10/01/2018.    Allergies  Allergen Reactions   Penicillins Swelling    Has patient had a PCN reaction causing immediate rash, facial/tongue/throat swelling, SOB or lightheadedness with hypotension: Yes Has patient had a PCN reaction causing severe rash involving mucus membranes or skin necrosis: No Has patient had a PCN reaction that required hospitalization: No Has patient had a PCN reaction occurring within the last 10 years: Yes If all of the above answers are "NO", then may proceed with Cephalosporin use. Leg swelling/redness   Ace Inhibitors Cough   Metformin And Related Nausea Only   Tramadol Rash    Scant rash on hands  After starting tramadol for knee pain in Jan 16, 2013    Review of Systems  Constitutional: Negative for chills and fever.  HENT: Negative.   Eyes: Negative.   Respiratory: Negative.  Negative for cough and shortness of breath.   Cardiovascular: Negative.   Gastrointestinal: Negative.   Endocrine: Negative.   Genitourinary: Negative.   Musculoskeletal: Negative.   Skin: Negative.   Allergic/Immunologic: Negative.   Neurological: Negative.   Hematological: Negative.   Psychiatric/Behavioral: Negative.   All other systems reviewed and are negative.      Objective:     BP (!) 138/106    Pulse 80    Temp 98.4 F (36.9 C) (Oral)    Resp 12    Ht 5\' 3"  (1.6 m)    Wt (!) 340 lb 1.9 oz (154.3 kg)    SpO2 95%    BMI 60.25 kg/m   Physical Exam Vitals signs and nursing note reviewed.  Constitutional:      Appearance: Normal appearance. She is  well-developed and well-groomed. She is obese.  HENT:     Head: Normocephalic and atraumatic.     Right Ear: External ear normal.     Left Ear: External ear normal.     Nose: Nose normal.     Mouth/Throat:     Mouth: Mucous membranes are moist.     Pharynx: Oropharynx is clear.  Eyes:     General:        Right eye: No discharge.        Left eye: No  discharge.     Conjunctiva/sclera: Conjunctivae normal.  Neck:     Musculoskeletal: Normal range of motion and neck supple.  Cardiovascular:     Rate and Rhythm: Normal rate and regular rhythm.     Pulses: Normal pulses.     Heart sounds: Normal heart sounds.  Pulmonary:     Effort: Pulmonary effort is normal.     Breath sounds: Normal breath sounds.  Musculoskeletal: Normal range of motion.  Skin:    General: Skin is warm.  Neurological:     General: No focal deficit present.     Mental Status: She is alert and oriented to person, place, and time.  Psychiatric:        Attention and Perception: Attention normal.        Mood and Affect: Mood normal.        Speech: Speech normal.        Behavior: Behavior normal. Behavior is cooperative.        Thought Content: Thought content normal.        Cognition and Memory: Cognition normal.        Judgment: Judgment normal.        Assessment and Plan        1. Type 2 diabetes mellitus with diabetic nephropathy, with long-term current use of insulin (HCC) Isabella Matthews is encouraged to check blood sugar daily as directed. Continue current medications. Is on statin as well. Eye exam in Dec.  She is reminded the importance of maintaining  good blood sugars,  taking medications as directed, daily foot care, annual eye exams. Additionally educated about keeping good control over blood pressure and cholesterol as well.  Referral has been placed to Dr Fransico Him and nutrition. I appreciate collaboration in patient's plan of care. Please let PCP know if assistance from [place] is needed.  -  Ambulatory referral to Endocrinology - Amb Referral to Nutrition and Diabetic E   2. Hypertension associated with type 2 diabetes mellitus (HCC) Isabella Matthews is encouraged to maintain a well balanced diet that is low in salt. She is on the line of not being well controlled, she reports not taking her medication until coming here.  She is also reminded that exercise is beneficial for heart health and control of  Blood pressure. 30-60 minutes daily is recommended-walking was suggested.  3. Morbid obesity with BMI of 60.0-69.9, adult (HCC)  Improved- without much effort, still eating poorly. Loss 2 pounds in last month.  Isabella Matthews is re-educated about the importance of exercise daily to help with weight management. A minumum of 30 minutes daily is recommended. Additionally, importance of healthy food choices  with portion control discussed. Encouraged to start a food diary, count calories and to consider joining a  support group if possible.   Filed Weights   10/01/18 0809  Weight: (!) 340 lb 1.9 oz (154.3 kg)    4. Need for immunization against influenza Patient was educated on the recommendation for flu vaccine. After obtaining informed consent, the vaccine was administered no adverse effects noted at time of administration. Patient provided with education on arm soreness and use of tylenol or ibuprofen (if safe) for this. Encourage to use the arm vaccine was given in to help reduce the soreness. Patient educated on the signs of a reaction to the vaccine and advised to contact the office should these occur.   - Flu Vaccine QUAD 36+ mos IM  Follow Up: 10/17/2018  Freddy Finner,  DNP, AGNP-BC [facility] [address] [place], [place] [address] Office Hours: Mon-Thurs 8 am-5 pm; Fri 8 am-12 pm Office Phone:  [phone]  Office Fax: [phone]

## 2018-10-03 ENCOUNTER — Ambulatory Visit: Payer: No Typology Code available for payment source | Admitting: Family Medicine

## 2018-10-15 LAB — BASIC METABOLIC PANEL WITH GFR
BUN: 20 mg/dL (ref 7–25)
CO2: 25 mmol/L (ref 20–32)
Calcium: 10 mg/dL (ref 8.6–10.2)
Chloride: 102 mmol/L (ref 98–110)
Creat: 0.76 mg/dL (ref 0.50–1.10)
GFR, Est African American: 107 mL/min/{1.73_m2} (ref >=60)
GFR, Est Non African American: 92 mL/min/{1.73_m2} (ref >=60)
Glucose, Bld: 94 mg/dL (ref 65–139)
Potassium: 3.7 mmol/L (ref 3.5–5.3)
Sodium: 139 mmol/L (ref 135–146)

## 2018-10-15 NOTE — Progress Notes (Signed)
Labs look great, and blood sugar fasting is in range! Continue the good work with lifestyle changes and medications. So happy to see this improvement. If this continues A1c should improve as well.

## 2018-10-17 ENCOUNTER — Other Ambulatory Visit: Payer: Self-pay

## 2018-10-17 ENCOUNTER — Ambulatory Visit (INDEPENDENT_AMBULATORY_CARE_PROVIDER_SITE_OTHER): Payer: No Typology Code available for payment source | Admitting: Family Medicine

## 2018-10-17 ENCOUNTER — Encounter: Payer: Self-pay | Admitting: Family Medicine

## 2018-10-17 VITALS — BP 133/87 | HR 100 | Ht 63.0 in | Wt 340.0 lb

## 2018-10-17 DIAGNOSIS — E1121 Type 2 diabetes mellitus with diabetic nephropathy: Secondary | ICD-10-CM | POA: Diagnosis not present

## 2018-10-17 DIAGNOSIS — Z794 Long term (current) use of insulin: Secondary | ICD-10-CM

## 2018-10-17 NOTE — Progress Notes (Signed)
Virtual Visit via Telephone Note   This visit type was conducted due to national recommendations for restrictions regarding the COVID-19 Pandemic (e.g. social distancing) in an effort to limit this patient's exposure and mitigate transmission in our community.  Due to her co-morbid illnesses, this patient is at least at moderate risk for complications without adequate follow up.  This format is felt to be most appropriate for this patient at this time.  The patient did not have access to video technology/had technical difficulties with video requiring transitioning to audio format only (telephone).  All issues noted in this document were discussed and addressed.  No physical exam could be performed with this format.    Evaluation Performed:  Follow-up visit  Date:  10/17/2018   ID:  Isabella Matthews, DOB February 08, 1970, MRN [id number]  Patient Location: Home Provider Location: Office  Location of Patient: Home Location of Provider: Telehealth Consent was obtain for visit to be over via telehealth. I verified that I am speaking with the correct person using two identifiers.  PCP:  Kerri Perches, MD   Chief Complaint:  DM  History of Present Illness:    Isabella Matthews is a 48 y.o. female with history of diabetes and obesity.  Reports today for 2-week checkup after being seen in the office and realizing that her instructions for taking the insulin was confusing for her over the phone.  She had been taking 80 units twice daily instead of 40 units twice daily.  Her blood sugars are still running in the 140-170 range in the morning she says.  Previously it sounded like she was actually better controlled on the 80 units with 87 being the lowest fasting that she saw.  She is very variable and not well controlled.  I provided her with a booklet so that she can start writing down her food that she was eating.  She is done really well with this she is very proud of herself with this.  She  denies having any polydipsia and polyuria.  She denies having any hypoglycemia.  Last eye exam was August  2019.  Last foot exam in the office was March 2020.  Last A1c 10.5% on August 21 of 2020.  Denies having any nonhealing wounds or rashes.  Denies having any signs of symptoms of UTI or infections.  She is working really hard to follow a low sugar and low carbohydrate diet.  She is drinking more water she knows she still does not make the best food choices.  She knows that skipping meals is not healthy anymore.  She is really trying to do better with this.  And is looking forward to seeing what her A1c is in November.   She reports that her blood sugars are elevated in the morning.  But then reports that her blood sugars are in the 200 range in the evening.  Put a referral out for Dr. Fransico Him with endocrinology she has an appointment with him on October 5.  The patient does not have symptoms concerning for COVID-19 infection (fever, chills, cough, or new shortness of breath).   Past Medical, Surgical, Social History, Allergies, and Medications have been Reviewed.   Past Medical History:  Diagnosis Date   Aortic stenosis, mild    Aortic valve disorders    Diabetes mellitus    Edema    Essential hypertension    Morbid obesity (HCC)    Onychomycosis of toenail    Past Surgical  History:  Procedure Laterality Date   CESAREAN SECTION     x2     Current Meds  Medication Sig   gabapentin (NEURONTIN) 300 MG capsule Take 1 capsule (300 mg total) by mouth at bedtime.   glimepiride (AMARYL) 4 MG tablet Take 1 tablet by mouth twice daily   hydrochlorothiazide (HYDRODIURIL) 25 MG tablet Take 1 tablet (25 mg total) by mouth daily.   Insulin Glargine (LANTUS SOLOSTAR) 100 UNIT/ML Solostar Pen Inject 40 units subcutaneously twice daily at 10 am and 10 pm.  Needs to be given 12 hours apart. Dispense 90- day supply   losartan (COZAAR) 50 MG tablet Take 1 tablet (50 mg total) by mouth daily.    nystatin (MYCOSTATIN/NYSTOP) powder APPLY TO AFFECTED AREA(S) FOUR TIMES DAILY FOR 1 WEEK, THEN AS NEEDED, FOR RASH (Patient taking differently: Apply topically as needed (for rash/irritation). )   oxymetazoline (AFRIN NODRIP EXTRA MOISTURE) 0.05 % nasal spray Place 1 spray into both nostrils 2 (two) times daily as needed for congestion.   pravastatin (PRAVACHOL) 20 MG tablet Take 1 tablet (20 mg total) by mouth daily.     Allergies:   Penicillins, Ace inhibitors, Metformin and related, and Tramadol   Social History   Tobacco Use   Smoking status: Former Smoker    Packs/day: 0.50    Types: Cigarettes    Quit date: 11/23/2014    Years since quitting: 3.9   Smokeless tobacco: Never Used  Substance Use Topics   Alcohol use: Yes    Alcohol/week: 0.0 standard drinks    Comment: occasionally   Drug use: No     Family Hx: The patient's family history includes Diabetes in her father; Hypertension in her father and mother.  ROS:   Please see the history of present illness.    All other systems reviewed and are negative.   Labs/Other Tests and Data Reviewed:    Recent Labs: 12/17/2017: Hemoglobin 12.4; Platelets 238 03/21/2018: ALT 16 10/14/2018: BUN 20; Creat 0.76; Potassium 3.7; Sodium 139   Recent Lipid Panel Lab Results  Component Value Date/Time   CHOL 174 03/21/2018 09:52 AM   TRIG 142 03/21/2018 09:52 AM   HDL 60 03/21/2018 09:52 AM   CHOLHDL 2.9 03/21/2018 09:52 AM   LDLCALC 90 03/21/2018 09:52 AM    Wt Readings from Last 3 Encounters:  10/17/18 (!) 340 lb (154.2 kg)  10/01/18 (!) 340 lb 1.9 oz (154.3 kg)  08/30/18 (!) 342 lb (155.1 kg)     Objective:    Vital Signs:  BP 133/87    Pulse 100    Ht 5\' 3"  (1.6 m)    Wt (!) 340 lb (154.2 kg)    BMI 60.23 kg/m    VITAL SIGNS:  reviewed GEN:  Alert and oriented RESPIRATORY:  No shortness of breath noted in conversation PSYCH:  Normal affect and mood  ASSESSMENT & PLAN:    1. Type 2 diabetes mellitus  with diabetic nephropathy, with long-term current use of insulin (HCC)  Ms. Nabarro is working really hard on changing her diet and lifestyle.  She reports that she has had trouble adjusting to some of the food choices.  Is keeping a daily diary and is very proud of herself for this.  I have advised that she takes her diarrhea and her blood sugar readings to her new appointment with Dr. Fransico Him in October.  I have instructed her to continue current medications at this time.  To see if she can continue  to get some improvement without adjustment of the medications.  Appreciate Dr. Isidoro Donning input in this.  Possibly need for nutrition as she still making poor food choices.  Is on statin medication.  She reports she has an eye exam in December.   Time:   Today, I have spent 10 minutes with the patient with telehealth technology discussing the above problems.     Medication Adjustments/Labs and Tests Ordered: Current medicines are reviewed at length with the patient today.  Concerns regarding medicines are outlined above.   Tests Ordered: No orders of the defined types were placed in this encounter.   Medication Changes: No orders of the defined types were placed in this encounter.   Disposition:  Follow up Nov   Signed, Freddy Finner, NP  10/17/2018 3:59 PM     [facility]

## 2018-10-17 NOTE — Patient Instructions (Signed)
    I appreciate the opportunity to provide you with the care for your health and wellness. Today we discussed: diabetes  Follow up: Nov   No labs or referrals today  Please keep continuing to keep your food diary and checking her blood sugars as we talked about.  You might be insulin resistance but diet control was going to be a very important factor in how your body responds to insulin.  Making sure that you try to get 30 minutes of walking in daily is also going to be of benefit to you.  Pending what Dr. Dorris Fetch thinks is best we will go off of his referrals and changes or recommendations for your care regarding her diabetes.  Stay positive I am very happy that you are engaged in your journey and your health care.  I am here for you if you need me I will see you back in November happy fall.  Please continue to practice social distancing to keep you, your family, and our community safe.  If you must go out, please wear a mask and practice good handwashing.  [place] YOUR HANDS WELL AND FREQUENTLY. AVOID TOUCHING YOUR FACE, UNLESS YOUR HANDS ARE FRESHLY WASHED.  GET FRESH AIR DAILY. STAY HYDRATED WITH WATER.   It was a pleasure to see you and I look forward to continuing to work together on your health and well-being. Please do not hesitate to call the office if you need care or have questions about your care.  Have a wonderful day and week. With Gratitude, Cherly Beach, DNP, AGNP-BC

## 2018-10-25 ENCOUNTER — Other Ambulatory Visit: Payer: Self-pay | Admitting: Family Medicine

## 2018-10-28 ENCOUNTER — Ambulatory Visit (INDEPENDENT_AMBULATORY_CARE_PROVIDER_SITE_OTHER): Payer: No Typology Code available for payment source | Admitting: "Endocrinology

## 2018-10-28 ENCOUNTER — Other Ambulatory Visit: Payer: Self-pay

## 2018-10-28 ENCOUNTER — Encounter: Payer: Self-pay | Admitting: "Endocrinology

## 2018-10-28 VITALS — BP 134/80 | HR 90 | Ht 63.0 in | Wt 329.0 lb

## 2018-10-28 DIAGNOSIS — E782 Mixed hyperlipidemia: Secondary | ICD-10-CM | POA: Diagnosis not present

## 2018-10-28 DIAGNOSIS — I10 Essential (primary) hypertension: Secondary | ICD-10-CM

## 2018-10-28 DIAGNOSIS — E1121 Type 2 diabetes mellitus with diabetic nephropathy: Secondary | ICD-10-CM | POA: Diagnosis not present

## 2018-10-28 DIAGNOSIS — Z794 Long term (current) use of insulin: Secondary | ICD-10-CM | POA: Diagnosis not present

## 2018-10-28 MED ORDER — GLIPIZIDE ER 5 MG PO TB24: 5.0000 mg | ORAL_TABLET | Freq: Every day | ORAL | 3 refills | Status: DC

## 2018-10-28 MED ORDER — TRULICITY 0.75 MG/0.5ML ~~LOC~~ SOAJ: 0.7500 mg | SUBCUTANEOUS | 2 refills | Status: DC

## 2018-10-28 MED ORDER — LANTUS SOLOSTAR 100 UNIT/ML ~~LOC~~ SOPN: 60.0000 [IU] | PEN_INJECTOR | Freq: Every day | SUBCUTANEOUS | 2 refills | Status: DC

## 2018-10-28 NOTE — Progress Notes (Signed)
Endocrinology Consult Note       10/28/2018, 4:32 PM   Subjective:    Patient ID: Isabella Matthews, female    DOB: 1970-05-07.  Isabella Matthews is being seen in consultation for management of currently uncontrolled symptomatic diabetes requested by  Fayrene Helper, MD.   Past Medical History:  Diagnosis Date  . Aortic stenosis, mild   . Aortic valve disorders   . Diabetes mellitus   . Edema   . Essential hypertension   . Morbid obesity ([place])   . Onychomycosis of toenail     Past Surgical History:  Procedure Laterality Date  . CESAREAN SECTION     x2    Social History   Socioeconomic History  . Marital status: Single    Spouse name: Not on file  . Number of children: Not on file  . Years of education: Not on file  . Highest education level: Not on file  Occupational History  . Occupation: CNA - [employer]   Social Needs  . Financial resource strain: Not on file  . Food insecurity    Worry: Not on file    Inability: Not on file  . Transportation needs    Medical: Not on file    Non-medical: Not on file  Tobacco Use  . Smoking status: Former Smoker    Packs/day: 0.50    Types: Cigarettes    Quit date: 11/23/2014    Years since quitting: 3.9  . Smokeless tobacco: Never Used  Substance and Sexual Activity  . Alcohol use: Yes    Alcohol/week: 0.0 standard drinks    Comment: occasionally  . Drug use: No  . Sexual activity: Not on file  Lifestyle  . Physical activity    Days per week: Not on file    Minutes per session: Not on file  . Stress: Not on file  Relationships  . Social Herbalist on phone: Not on file    Gets together: Not on file    Attends religious service: Not on file    Active member of club or organization: Not on file    Attends meetings of clubs or organizations: Not on file    Relationship status: Not on file  Other Topics Concern  . Not on  file  Social History Narrative  . Not on file    Family History  Problem Relation Age of Onset  . Hypertension Mother   . Hypertension Father   . Diabetes Father     Outpatient Encounter Medications as of 10/28/2018  Medication Sig  . Dulaglutide (TRULICITY) 9.32 IZ/1.2WP SOPN Inject 0.75 mg into the skin once a week.  . gabapentin (NEURONTIN) 300 MG capsule Take 1 capsule (300 mg total) by mouth at bedtime.  Marland Kitchen glipiZIDE (GLUCOTROL XL) 5 MG 24 hr tablet Take 1 tablet (5 mg total) by mouth daily with breakfast.  . hydrochlorothiazide (HYDRODIURIL) 25 MG tablet Take 1 tablet by mouth once daily  . Insulin Glargine (LANTUS SOLOSTAR) 100 UNIT/ML Solostar Pen Inject 60 Units into the skin at bedtime.  Marland Kitchen losartan (COZAAR) 50 MG tablet Take 1  tablet (50 mg total) by mouth daily.  Marland Kitchen nystatin (MYCOSTATIN/NYSTOP) powder APPLY TO AFFECTED AREA(S) FOUR TIMES DAILY FOR 1 WEEK, THEN AS NEEDED, FOR RASH (Patient taking differently: Apply topically as needed (for rash/irritation). )  . oxymetazoline (AFRIN NODRIP EXTRA MOISTURE) 0.05 % nasal spray Place 1 spray into both nostrils 2 (two) times daily as needed for congestion.  . pravastatin (PRAVACHOL) 20 MG tablet Take 1 tablet (20 mg total) by mouth daily.  . [DISCONTINUED] glimepiride (AMARYL) 4 MG tablet Take 1 tablet by mouth twice daily  . [DISCONTINUED] Insulin Glargine (LANTUS SOLOSTAR) 100 UNIT/ML Solostar Pen Inject 40 units subcutaneously twice daily at 10 am and 10 pm.  Needs to be given 12 hours apart. Dispense 90- day supply   No facility-administered encounter medications on file as of 10/28/2018.     ALLERGIES: Allergies  Allergen Reactions  . Penicillins Swelling    Has patient had a PCN reaction causing immediate rash, facial/tongue/throat swelling, SOB or lightheadedness with hypotension: Yes Has patient had a PCN reaction causing severe rash involving mucus membranes or skin necrosis: No Has patient had a PCN reaction that  required hospitalization: No Has patient had a PCN reaction occurring within the last 10 years: Yes If all of the above answers are "NO", then may proceed with Cephalosporin use. Leg swelling/redness  . Ace Inhibitors Cough  . Metformin And Related Nausea Only  . Tramadol Rash    Scant rash on hands  After starting tramadol for knee pain in Jan 16, 2013    VACCINATION STATUS: Immunization History  Administered Date(s) Administered  . Influenza Split 10/27/2013  . Influenza Whole 12/29/2005, 09/29/2009  . Influenza,inj,Quad PF,6+ Mos 10/17/2012, 10/24/2016, 10/01/2018  . Pneumococcal Polysaccharide-23 01/12/2006, 07/09/2015  . Tdap 08/12/2010    Diabetes She presents for her initial diabetic visit. She has type 2 diabetes mellitus. Onset time: She was diagnosed at approximate age of 48 years, she did have an episode of gestational diabetes. Her disease course has been worsening. There are no hypoglycemic associated symptoms. Pertinent negatives for hypoglycemia include no confusion, headaches, pallor or seizures. Associated symptoms include fatigue, polydipsia and polyuria. Pertinent negatives for diabetes include no chest pain and no polyphagia. There are no hypoglycemic complications. Symptoms are worsening. Diabetic complications include peripheral neuropathy. Risk factors for coronary artery disease include diabetes mellitus, dyslipidemia, family history, hypertension, obesity and sedentary lifestyle. Current diabetic treatment includes insulin injections and oral agent (monotherapy). Her weight is increasing steadily. She is following a generally unhealthy diet. When asked about meal planning, she reported none. She has not had a previous visit with a dietitian. She rarely participates in exercise. (She did not bring any logs nor meter to review.  Her recent A1c was 10.5% increasing from 7.6%.) An ACE inhibitor/angiotensin II receptor blocker is being taken. Eye exam is current.   Hyperlipidemia This is a chronic problem. The current episode started more than 1 year ago. The problem is controlled. Exacerbating diseases include diabetes and obesity. Pertinent negatives include no chest pain, myalgias or shortness of breath. Current antihyperlipidemic treatment includes statins. Risk factors for coronary artery disease include dyslipidemia, family history, obesity, hypertension, a sedentary lifestyle and diabetes mellitus.  Hypertension This is a chronic problem. The current episode started more than 1 year ago. Pertinent negatives include no chest pain, headaches, palpitations or shortness of breath. Risk factors for coronary artery disease include diabetes mellitus, dyslipidemia, obesity and sedentary lifestyle. Past treatments include angiotensin blockers.     Review of  Systems  Constitutional: Positive for fatigue. Negative for chills, fever and unexpected weight change.  HENT: Negative for trouble swallowing and voice change.   Eyes: Negative for visual disturbance.  Respiratory: Negative for cough, shortness of breath and wheezing.   Cardiovascular: Negative for chest pain, palpitations and leg swelling.  Gastrointestinal: Negative for diarrhea, nausea and vomiting.  Endocrine: Positive for polydipsia and polyuria. Negative for cold intolerance, heat intolerance and polyphagia.  Musculoskeletal: Negative for arthralgias and myalgias.  Skin: Negative for color change, pallor, rash and wound.  Neurological: Negative for seizures and headaches.  Psychiatric/Behavioral: Negative for confusion and suicidal ideas.    Objective:    BP 134/80   Pulse 90   Ht 5\' 3"  (1.6 m)   Wt (!) 329 lb (149.2 kg)   BMI 58.28 kg/m   Wt Readings from Last 3 Encounters:  10/28/18 (!) 329 lb (149.2 kg)  10/17/18 (!) 340 lb (154.2 kg)  10/01/18 (!) 340 lb 1.9 oz (154.3 kg)     Physical Exam Constitutional:      Appearance: She is well-developed.  HENT:     Head: Normocephalic  and atraumatic.  Neck:     Musculoskeletal: Normal range of motion and neck supple.     Thyroid: No thyromegaly.     Trachea: No tracheal deviation.  Pulmonary:     Effort: Pulmonary effort is normal.  Abdominal:     Tenderness: There is no abdominal tenderness. There is no guarding.  Musculoskeletal: Normal range of motion.  Skin:    General: Skin is warm and dry.     Coloration: Skin is not pale.     Findings: No erythema or rash.  Neurological:     Mental Status: She is alert and oriented to person, place, and time.     Cranial Nerves: No cranial nerve deficit.     Coordination: Coordination normal.     Deep Tendon Reflexes: Reflexes are normal and symmetric.  Psychiatric:        Judgment: Judgment normal.       CMP ( most recent) CMP     Component Value Date/Time   NA 139 10/14/2018 1552   K 3.7 10/14/2018 1552   CL 102 10/14/2018 1552   CO2 25 10/14/2018 1552   GLUCOSE 94 10/14/2018 1552   BUN 20 10/14/2018 1552   CREATININE 0.76 10/14/2018 1552   CALCIUM 10.0 10/14/2018 1552   PROT 7.6 03/21/2018 0952   ALBUMIN 3.9 03/16/2016 0808   AST 17 03/21/2018 0952   ALT 16 03/21/2018 0952   ALKPHOS 52 03/16/2016 0808   BILITOT 0.5 03/21/2018 0952   GFRNONAA 92 10/14/2018 1552   GFRAA 107 10/14/2018 1552     Diabetic Labs (most recent): Lab Results  Component Value Date   HGBA1C 10.5 (H) 09/13/2018   HGBA1C 7.6 (H) 03/21/2018   HGBA1C 8.1 (H) 10/22/2017     Lipid Panel ( most recent) Lipid Panel     Component Value Date/Time   CHOL 174 03/21/2018 0952   TRIG 142 03/21/2018 0952   HDL 60 03/21/2018 0952   CHOLHDL 2.9 03/21/2018 0952   VLDL 22 03/16/2016 0808   LDLCALC 90 03/21/2018 0952      Lab Results  Component Value Date   TSH 0.96 01/27/2017   TSH 1.18 11/05/2015   TSH 1.603 07/10/2014   TSH 1.152 10/14/2012   TSH 1.202 10/14/2012   TSH 0.437 04/20/2011   TSH 1.274 04/06/2009   TSH 1.394 07/11/2007  Assessment & Plan:   1. Type 2  diabetes mellitus with diabetic nephropathy, with long-term current use of insulin (HCC)   - Isabella Matthews has currently uncontrolled symptomatic type 2 DM since  48 years of age,  with most recent A1c of 10.5 %. Recent labs reviewed. - I had a long discussion with her about the progressive nature of diabetes and the pathology behind its complications. -her diabetes is complicated by neuropathy, obesity/sedentary life and she remains at a high risk for more acute and chronic complications which include CAD, CVA, CKD, retinopathy, and neuropathy. These are all discussed in detail with her.  - I have counseled her on diet  and weight management  by adopting a carbohydrate restricted/protein rich diet. Patient is encouraged to switch to  unprocessed or minimally processed     complex starch and increased protein intake (animal or plant source), fruits, and vegetables. -  she is advised to stick to a routine mealtimes to eat 3 meals  a day and avoid unnecessary snacks ( to snack only to correct hypoglycemia).   - she admits that there is a room for improvement in her food and drink choices. - Suggestion is made for her to avoid simple carbohydrates  from her diet including Cakes, Sweet Desserts, Ice Cream, Soda (diet and regular), Sweet Tea, Candies, Chips, Cookies, Store Bought Juices, Alcohol in Excess of  1-2 drinks a day, Artificial Sweeteners,  Coffee Creamer, and "Sugar-free" Products. This will help patient to have more stable blood glucose profile and potentially avoid unintended weight gain.  - she will be scheduled with Norm Salt, RDN, CDE for diabetes education.  - I have approached her with the following individualized plan to manage  her diabetes and patient agrees:   -Given her current glycemic burden, she will continue to benefit insulin treatment, advised her to increase her Lantus to 60 units and switch to nightly, , associated with strict monitoring of glucose 4 times a  day-before meals and at bedtime. - she is warned not to take insulin without proper monitoring per orders. - she is encouraged to call clinic for blood glucose levels less than 70 or above 300 mg /dl. -I discussed and switched her glimepiride to glipizide 5 mg XL p.o. daily at breakfast. -I discussed initiated Trulicity 0.5 mg subcutaneously weekly.  His medication will be advanced if tolerated. -She will be considered for prandial insulin if she continues to have significantly above target postprandial hyperglycemia. -She reports GI intolerance from metformin.  - Specific targets for  A1c;  LDL, HDL, Triglycerides, and  Waist Circumference were discussed with the patient.  2) Blood Pressure /Hypertension:  her blood pressure is  controlled to target.   she is advised to continue her current medications including 50 mg p.o. daily with breakfast . 3) Lipids/Hyperlipidemia:   Review of her recent lipid panel showed uncontrolled  LDL at 90 .  she  is advised to continue    pravastatin 20 mg daily at bedtime.  Side effects and precautions discussed with her.  4)  Weight/Diet:  Body mass index is 58.28 kg/m.  -   clearly complicating her diabetes care.   she is  a candidate for weight loss. I discussed with her the fact that loss of 5 - 10% of her  current body weight will have the most impact on her diabetes management.  Exercise, and detailed carbohydrates information provided  -  detailed on discharge instructions. -She may benefit from bariatric  surgery, will be discussed in more detail on subsequent visit.  5) Chronic Care/Health Maintenance:  -she  is on ACEI/ARB and Statin medications and  is encouraged to initiate and continue to follow up with Ophthalmology, Dentist,  Podiatrist at least yearly or according to recommendations, and advised to   stay away from smoking. I have recommended yearly flu vaccine and pneumonia vaccine at least every 5 years; moderate intensity exercise for up to 150  minutes weekly; and  sleep for at least 7 hours a day.  - she is  advised to maintain close follow up with Kerri Perches, MD for primary care needs, as well as her other providers for optimal and coordinated care.  - Time spent with the patient: 45 minutes, of which >50% was spent in obtaining information about her symptoms, reviewing her previous labs/studies, evaluations, and treatments, counseling her about her currently uncontrolled type 2 diabetes, hyperlipidemia, hypertension, and developing plans for long term treatment based on the latest standards of care/guidelines.  Please refer to " Patient Self Inventory" in the Media  tab for reviewed elements of pertinent patient history.  Celesta Aver participated in the discussions, expressed understanding, and voiced agreement with the above plans.  All questions were answered to her satisfaction. she is encouraged to contact clinic should she have any questions or concerns prior to her return visit.  Follow up plan: - Return in about 10 days (around 11/07/2018) for Follow up with Meter and Logs Only - no Labs.  Marquis Lunch, MD [facility] [address] [place], [place] [address] Phone: [phone]  Fax: [phone]    10/28/2018, 4:32 PM  This note was partially dictated with voice recognition software. Similar sounding words can be transcribed inadequately or may not  be corrected upon review.

## 2018-10-28 NOTE — Patient Instructions (Signed)

## 2018-11-06 ENCOUNTER — Other Ambulatory Visit: Payer: Self-pay

## 2018-11-06 ENCOUNTER — Encounter: Payer: Self-pay | Admitting: "Endocrinology

## 2018-11-06 ENCOUNTER — Ambulatory Visit (INDEPENDENT_AMBULATORY_CARE_PROVIDER_SITE_OTHER): Payer: No Typology Code available for payment source | Admitting: "Endocrinology

## 2018-11-06 DIAGNOSIS — I10 Essential (primary) hypertension: Secondary | ICD-10-CM

## 2018-11-06 DIAGNOSIS — E782 Mixed hyperlipidemia: Secondary | ICD-10-CM

## 2018-11-06 DIAGNOSIS — E1121 Type 2 diabetes mellitus with diabetic nephropathy: Secondary | ICD-10-CM

## 2018-11-06 DIAGNOSIS — Z794 Long term (current) use of insulin: Secondary | ICD-10-CM | POA: Diagnosis not present

## 2018-11-06 MED ORDER — TRULICITY 1.5 MG/0.5ML ~~LOC~~ SOAJ: 1.5000 mg | SUBCUTANEOUS | 2 refills | Status: DC

## 2018-11-06 NOTE — Progress Notes (Signed)
11/06/2018, 3:57 PM                                                     Endocrinology Telehealth Visit Follow up Note -During COVID -19 Pandemic  This visit type was conducted due to national recommendations for restrictions regarding the COVID-19 Pandemic  in an effort to limit this patient's exposure and mitigate transmission of the corona virus.  Due to her co-morbid illnesses, Isabella Matthews is at  moderate to high risk for complications without adequate follow up.  This format is felt to be most appropriate for her at this time.  I connected with this patient on 11/06/2018   by telephone and verified that I am speaking with the correct person using two identifiers. Isabella Matthews, [birth year]. she has verbally consented to this visit. All issues noted in this document were discussed and addressed. The format was not optimal for physical exam.   Subjective:    Patient ID: Isabella Matthews, female    DOB: [birth year].  Isabella Matthews is engaged in telehealth via telephone after she was  seen in consultation for management of currently uncontrolled symptomatic diabetes requested by  Kerri Perches, MD.   Past Medical History:  Diagnosis Date  . Aortic stenosis, mild   . Aortic valve disorders   . Diabetes mellitus   . Edema   . Essential hypertension   . Morbid obesity (HCC)   . Onychomycosis of toenail     Past Surgical History:  Procedure Laterality Date  . CESAREAN SECTION     x2    Social History   Socioeconomic History  . Marital status: Single    Spouse name: Not on file  . Number of children: Not on file  . Years of education: Not on file  . Highest education level: Not on file  Occupational History  . Occupation: CNA - [employer]   Social Needs  . Financial resource strain: Not on file  . Food insecurity    Worry: Not on file    Inability: Not on file  . Transportation needs    Medical: Not on file    Non-medical: Not on  file  Tobacco Use  . Smoking status: Former Smoker    Packs/day: 0.50    Types: Cigarettes    Quit date: 11/23/2014    Years since quitting: 3.9  . Smokeless tobacco: Never Used  Substance and Sexual Activity  . Alcohol use: Yes    Alcohol/week: 0.0 standard drinks    Comment: occasionally  . Drug use: No  . Sexual activity: Not on file  Lifestyle  . Physical activity    Days per week: Not on file    Minutes per session: Not on file  . Stress: Not on file  Relationships  . Social Musician on phone: Not on file    Gets together: Not on file    Attends religious service: Not on file    Active member of club or organization: Not on file    Attends meetings of clubs or organizations: Not on file    Relationship status: Not on file  Other Topics Concern  . Not on file  Social History Narrative  . Not on file    Family History  Problem Relation Age of Onset  . Hypertension Mother   . Hypertension Father   . Diabetes Father     Outpatient Encounter Medications as of 11/06/2018  Medication Sig  . Dulaglutide (TRULICITY) 1.5 MG/0.5ML SOPN Inject 1.5 mg into the skin once a week.  . gabapentin (NEURONTIN) 300 MG capsule Take 1 capsule (300 mg total) by mouth at bedtime.  Marland Kitchen glipiZIDE (GLUCOTROL XL) 5 MG 24 hr tablet Take 1 tablet (5 mg total) by mouth daily with breakfast.  . hydrochlorothiazide (HYDRODIURIL) 25 MG tablet Take 1 tablet by mouth once daily  . Insulin Glargine (LANTUS SOLOSTAR) 100 UNIT/ML Solostar Pen Inject 60 Units into the skin at bedtime.  Marland Kitchen losartan (COZAAR) 50 MG tablet Take 1 tablet (50 mg total) by mouth daily.  Marland Kitchen nystatin (MYCOSTATIN/NYSTOP) powder APPLY TO AFFECTED AREA(S) FOUR TIMES DAILY FOR 1 WEEK, THEN AS NEEDED, FOR RASH (Patient taking differently: Apply topically as needed (for rash/irritation). )  . oxymetazoline (AFRIN NODRIP EXTRA MOISTURE) 0.05 % nasal spray Place 1 spray into both nostrils 2 (two) times daily as needed for  congestion.  . pravastatin (PRAVACHOL) 20 MG tablet Take 1 tablet (20 mg total) by mouth daily.  . [DISCONTINUED] Dulaglutide (TRULICITY) 0.75 MG/0.5ML SOPN Inject 0.75 mg into the skin once a week.   No facility-administered encounter medications on file as of 11/06/2018.     ALLERGIES: Allergies  Allergen Reactions  . Penicillins Swelling    Has patient had a PCN reaction causing immediate rash, facial/tongue/throat swelling, SOB or lightheadedness with hypotension: Yes Has patient had a PCN reaction causing severe rash involving mucus membranes or skin necrosis: No Has patient had a PCN reaction that required hospitalization: No Has patient had a PCN reaction occurring within the last 10 years: Yes If all of the above answers are "NO", then may proceed with Cephalosporin use. Leg swelling/redness  . Ace Inhibitors Cough  . Metformin And Related Nausea Only  . Tramadol Rash    Scant rash on hands  After starting tramadol for knee pain in Jan 16, 2013    VACCINATION STATUS: Immunization History  Administered Date(s) Administered  . Influenza Split 10/27/2013  . Influenza Whole 12/29/2005, 09/29/2009  . Influenza,inj,Quad PF,6+ Mos 10/17/2012, 10/24/2016, 10/01/2018  . Pneumococcal Polysaccharide-23 01/12/2006, 07/09/2015  . Tdap 08/12/2010    Diabetes She presents for her follow-up diabetic visit. She has type 2 diabetes mellitus. Onset time: She was diagnosed at approximate age of 37 years, she did have an episode of gestational diabetes. Her disease course has been improving. There are no hypoglycemic associated symptoms. Pertinent negatives for hypoglycemia include no confusion, headaches, pallor or seizures. Pertinent negatives for diabetes include no chest pain, no fatigue, no polydipsia, no polyphagia and no polyuria. There are no hypoglycemic complications. Symptoms are improving. Diabetic complications include peripheral neuropathy. Risk factors for coronary artery disease  include diabetes mellitus, dyslipidemia, family history, hypertension, obesity and sedentary lifestyle. Current diabetic treatment includes insulin injections and oral agent (monotherapy). She is following a generally unhealthy diet. When asked about meal planning, she reported none. She has not had a previous visit with a dietitian. She rarely participates in exercise. Her breakfast blood glucose range is generally 140-180 mg/dl. Her lunch blood glucose range is generally 140-180 mg/dl. Her dinner blood glucose range is generally 140-180 mg/dl. Her bedtime blood glucose range is generally 180-200 mg/dl. Her overall blood glucose range is 140-180 mg/dl. (She is reporting near target glycemic profile, both fasting and postprandial.  Her  recent A1c was 10.5% increasing from 7.6%.) An ACE inhibitor/angiotensin II receptor blocker is being taken. Eye exam is current.  Hyperlipidemia This is a chronic problem. The current episode started more than 1 year ago. The problem is controlled. Exacerbating diseases include diabetes and obesity. Pertinent negatives include no chest pain, myalgias or shortness of breath. Current antihyperlipidemic treatment includes statins. Risk factors for coronary artery disease include dyslipidemia, family history, obesity, hypertension, a sedentary lifestyle and diabetes mellitus.  Hypertension This is a chronic problem. The current episode started more than 1 year ago. Pertinent negatives include no chest pain, headaches, palpitations or shortness of breath. Risk factors for coronary artery disease include diabetes mellitus, dyslipidemia, obesity and sedentary lifestyle. Past treatments include angiotensin blockers.    Review of systems: Limited as above.  Objective:    There were no vitals taken for this visit.  Wt Readings from Last 3 Encounters:  10/28/18 (!) 329 lb (149.2 kg)  10/17/18 (!) 340 lb (154.2 kg)  10/01/18 (!) 340 lb 1.9 oz (154.3 kg)      CMP ( most  recent) CMP     Component Value Date/Time   NA 139 10/14/2018 1552   K 3.7 10/14/2018 1552   CL 102 10/14/2018 1552   CO2 25 10/14/2018 1552   GLUCOSE 94 10/14/2018 1552   BUN 20 10/14/2018 1552   CREATININE 0.76 10/14/2018 1552   CALCIUM 10.0 10/14/2018 1552   PROT 7.6 03/21/2018 0952   ALBUMIN 3.9 03/16/2016 0808   AST 17 03/21/2018 0952   ALT 16 03/21/2018 0952   ALKPHOS 52 03/16/2016 0808   BILITOT 0.5 03/21/2018 0952   GFRNONAA 92 10/14/2018 1552   GFRAA 107 10/14/2018 1552     Diabetic Labs (most recent): Lab Results  Component Value Date   HGBA1C 10.5 (H) 09/13/2018   HGBA1C 7.6 (H) 03/21/2018   HGBA1C 8.1 (H) 10/22/2017     Lipid Panel ( most recent) Lipid Panel     Component Value Date/Time   CHOL 174 03/21/2018 0952   TRIG 142 03/21/2018 0952   HDL 60 03/21/2018 0952   CHOLHDL 2.9 03/21/2018 0952   VLDL 22 03/16/2016 0808   LDLCALC 90 03/21/2018 0952      Lab Results  Component Value Date   TSH 0.96 01/27/2017   TSH 1.18 11/05/2015   TSH 1.603 07/10/2014   TSH 1.152 10/14/2012   TSH 1.202 10/14/2012   TSH 0.437 04/20/2011   TSH 1.274 04/06/2009   TSH 1.394 07/11/2007     Assessment & Plan:   1. Type 2 diabetes mellitus with diabetic nephropathy, with long-term current use of insulin (HCC)   - Isabella Matthews has currently uncontrolled symptomatic type 2 DM since  48 years of age. -She reports significantly improved glycemic profile to near target level.  This is despite the fact that her recent A1c of 10.5%. -Her recent labs were reviewed with her. - I had a long discussion with her about the progressive nature of diabetes and the pathology behind its complications. -her diabetes is complicated by neuropathy, obesity/sedentary life and she remains at a high risk for more acute and chronic complications which include CAD, CVA, CKD, retinopathy, and neuropathy. These are all discussed in detail with her.  - I have counseled her on diet  and  weight management  by adopting a carbohydrate restricted/protein rich diet. Patient is encouraged to switch to  unprocessed or minimally processed     complex starch and increased protein  intake (animal or plant source), fruits, and vegetables. -  she is advised to stick to a routine mealtimes to eat 3 meals  a day and avoid unnecessary snacks ( to snack only to correct hypoglycemia).   - she  admits there is a room for improvement in her diet and drink choices. -  Suggestion is made for her to avoid simple carbohydrates  from her diet including Cakes, Sweet Desserts / Pastries, Ice Cream, Soda (diet and regular), Sweet Tea, Candies, Chips, Cookies, Sweet Pastries,  Store Bought Juices, Alcohol in Excess of  1-2 drinks a day, Artificial Sweeteners, Coffee Creamer, and "Sugar-free" Products. This will help patient to have stable blood glucose profile and potentially avoid unintended weight gain.   - she will be scheduled with Norm Salt, RDN, CDE for diabetes education.  - I have approached her with the following individualized plan to manage  her diabetes and patient agrees:   -Given her current glycemic response, she will not need prandial insulin for now.  -She is advised to continue Lantus 60 units nightly, associated with monitoring of blood glucose 2 times a day-before BKF and at bedtime.  - she is warned not to take insulin without proper monitoring per orders. - she is encouraged to call clinic for blood glucose levels less than 70 or above 300 mg /dl. -She is advised to continue glipizide 5 mg XL p.o. daily at breakfast. -She has benefited and tolerated Trulicity.  I discussed and increased it to 1.5 mg subcutaneously weekly.   -She reports GI intolerance from metformin.  - Specific targets for  A1c;  LDL, HDL, Triglycerides, and  Waist Circumference were discussed with the patient.  2) Blood Pressure /Hypertension: she is advised to home monitor blood pressure and report if > 140/90  on 2 separate readings.   she is advised to continue her current medications including 50 mg p.o. daily with breakfast . 3) Lipids/Hyperlipidemia:   Review of her recent lipid panel showed uncontrolled  LDL at 90 .  she  is advised to continue pravastatin 20 mg p.o. daily at bedtime. Side effects and precautions discussed with her.  4)  Weight/Diet: Her BMI is 47-   clearly complicating her diabetes care.   she is  a candidate for weight loss. I discussed with her the fact that loss of 5 - 10% of her  current body weight will have the most impact on her diabetes management.  Exercise, and detailed carbohydrates information provided  -  detailed on discharge instructions. -She may benefit from bariatric surgery, will be discussed in more detail on subsequent visit.  5) Chronic Care/Health Maintenance:  -she  is on ACEI/ARB and Statin medications and  is encouraged to initiate and continue to follow up with Ophthalmology, Dentist,  Podiatrist at least yearly or according to recommendations, and advised to   stay away from smoking. I have recommended yearly flu vaccine and pneumonia vaccine at least every 5 years; moderate intensity exercise for up to 150 minutes weekly; and  sleep for at least 7 hours a day.  - she is  advised to maintain close follow up with Kerri Perches, MD for primary care needs, as well as her other providers for optimal and coordinated care.  - Patient Care Time Today:  25 min, of which >50% was spent in  counseling and the rest reviewing her  current and  previous labs/studies, previous treatments, her blood glucose readings, and medications' doses and developing a plan  for long-term care based on the latest recommendations for standards of care.   Isabella Matthews participated in the discussions, expressed understanding, and voiced agreement with the above plans.  All questions were answered to her satisfaction. she is encouraged to contact clinic should she have any  questions or concerns prior to her return visit.   Follow up plan: - Return in about 3 months (around 02/06/2019) for Bring Meter and Logs- A1c in Office, Include 8 log sheets.  Marquis Lunch, MD [facility] [address] [place], [place] [address] Phone: [phone]  Fax: [phone]    11/06/2018, 3:57 PM  This note was partially dictated with voice recognition software. Similar sounding words can be transcribed inadequately or may not  be corrected upon review.

## 2018-12-06 ENCOUNTER — Other Ambulatory Visit: Payer: Self-pay | Admitting: Family Medicine

## 2018-12-10 ENCOUNTER — Other Ambulatory Visit: Payer: Self-pay

## 2018-12-10 ENCOUNTER — Encounter: Payer: Self-pay | Admitting: Family Medicine

## 2018-12-10 ENCOUNTER — Ambulatory Visit (INDEPENDENT_AMBULATORY_CARE_PROVIDER_SITE_OTHER): Payer: No Typology Code available for payment source | Admitting: Family Medicine

## 2018-12-10 VITALS — BP 120/87 | Ht 63.0 in | Wt 329.0 lb

## 2018-12-10 DIAGNOSIS — Z794 Long term (current) use of insulin: Secondary | ICD-10-CM

## 2018-12-10 DIAGNOSIS — E1121 Type 2 diabetes mellitus with diabetic nephropathy: Secondary | ICD-10-CM | POA: Diagnosis not present

## 2018-12-10 DIAGNOSIS — E785 Hyperlipidemia, unspecified: Secondary | ICD-10-CM

## 2018-12-10 DIAGNOSIS — I10 Essential (primary) hypertension: Secondary | ICD-10-CM

## 2018-12-10 DIAGNOSIS — Z6841 Body Mass Index (BMI) 40.0 and over, adult: Secondary | ICD-10-CM

## 2018-12-10 DIAGNOSIS — E1159 Type 2 diabetes mellitus with other circulatory complications: Secondary | ICD-10-CM

## 2018-12-10 DIAGNOSIS — E782 Mixed hyperlipidemia: Secondary | ICD-10-CM | POA: Insufficient documentation

## 2018-12-10 DIAGNOSIS — E1169 Type 2 diabetes mellitus with other specified complication: Secondary | ICD-10-CM | POA: Diagnosis not present

## 2018-12-10 DIAGNOSIS — I152 Hypertension secondary to endocrine disorders: Secondary | ICD-10-CM

## 2018-12-10 NOTE — Progress Notes (Signed)
.      Virtual Visit via Telephone Note   This visit type was conducted due to national recommendations for restrictions regarding the COVID-19 Pandemic (e.g. social distancing) in an effort to limit this patient's exposure and mitigate transmission in our community.  Due to her co-morbid illnesses, this patient is at least at moderate risk for complications without adequate follow up.  This format is felt to be most appropriate for this patient at this time.  The patient did not have access to video technology/had technical difficulties with video requiring transitioning to audio format only (telephone).  All issues noted in this document were discussed and addressed.  No physical exam could be performed with this format.    Evaluation Performed:  Follow-up visit  Date:  12/10/2018   ID:  Isabella Matthews, DOB 11-04-1970, MRN [id number]  Patient Location: Home Provider Location: Office  Location of Patient: Home Location of Provider: Telehealth Consent was obtain for visit to be over via telehealth. I verified that I am speaking with the correct person using two identifiers.  PCP:  Kerri Perches, MD   Chief Complaint: Diabetic follow-up  History of Present Illness:    Isabella Matthews is a 48 y.o. female with history of diabetes, hypertension, hyperlipidemia, morbid obesity among others.  Is been followed up with Dr. Fransico Him now twice will be following up with him in January as well.  Reports that her blood sugars are doing much better.   Denies hypoglycemia.  Denies polydipsia and polyuria.   Last eye exam was 12/2018.   Last foot exam in office was 03/2018.   Last A1c 10.5% 08/2018 Denies non-healing wounds or rashes. Denies signs of UTI or other infections. Patient follows a low sugar diet and checks feet regularly without concerns. Reports staying hydrated by drinking water.  Reports taking all her medications as directed.  Not have any issue or side effects with them at  this time.  The patient does not have symptoms concerning for COVID-19 infection (fever, chills, cough, or new shortness of breath).   Past Medical, Surgical, Social History, Allergies, and Medications have been Reviewed.   Past Medical History:  Diagnosis Date  . Aortic stenosis, mild   . Aortic valve disorders   . Diabetes mellitus   . Edema   . Essential hypertension   . Morbid obesity (HCC)   . Onychomycosis of toenail    Past Surgical History:  Procedure Laterality Date  . CESAREAN SECTION     x2     No outpatient medications have been marked as taking for the 12/10/18 encounter (Office Visit) with Freddy Finner, NP.     Allergies:   Penicillins, Ace inhibitors, Metformin and related, and Tramadol   Social History   Tobacco Use  . Smoking status: Former Smoker    Packs/day: 0.50    Types: Cigarettes    Quit date: 11/23/2014    Years since quitting: 4.0  . Smokeless tobacco: Never Used  Substance Use Topics  . Alcohol use: Yes    Alcohol/week: 0.0 standard drinks    Comment: occasionally  . Drug use: No     Family Hx: The patient's family history includes Diabetes in her father; Hypertension in her father and mother.  ROS:   Please see the history of present illness.    All other systems reviewed and are negative.   Labs/Other Tests and Data Reviewed:     Recent Labs: 12/17/2017: Hemoglobin 12.4; Platelets 238 03/21/2018:  ALT 16 10/14/2018: BUN 20; Creat 0.76; Potassium 3.7; Sodium 139   Recent Lipid Panel Lab Results  Component Value Date/Time   CHOL 174 03/21/2018 09:52 AM   TRIG 142 03/21/2018 09:52 AM   HDL 60 03/21/2018 09:52 AM   CHOLHDL 2.9 03/21/2018 09:52 AM   LDLCALC 90 03/21/2018 09:52 AM    Wt Readings from Last 3 Encounters:  12/10/18 (!) 329 lb (149.2 kg)  10/28/18 (!) 329 lb (149.2 kg)  10/17/18 (!) 340 lb (154.2 kg)     Objective:    Vital Signs:  BP 120/87   Ht 5\' 3"  (1.6 m)   Wt (!) 329 lb (149.2 kg)   BMI 58.28  kg/m    GEN:  alert and oriented  RESPIRATORY:  no shortness of breath noted in conversation  PSYCH:  normal affect and mood, good communication   ASSESSMENT & PLAN:    1. Type 2 diabetes mellitus with diabetic nephropathy, with long-term current use of insulin (HCC) Isabella Matthews is working very hard to change her diet and lifestyle.  Has been seen by Dr. Fransico Him several times since our last appointment in September.  She reports that she has definitely gotten better about her diet and medication regime.  Has lost weight is now 329 pounds and has dropped a BMI level on the index.  Dr. Fransico Him has adjusted and change some of her medications around.  This includes: Trulicity, glipizide, 60 units of insulin gargling at bedtime.  She is checking her blood sugars as directed.  And has noted some improvement in her numbers.   Continue current medications. Is on statin as well. Educated on importance of maintain a well balanced diabetic friendly diet.  She is reminded the importance of maintaining  good blood sugars,  taking medications as directed, daily foot care, annual eye exams. Additionally educated about keeping good control over blood pressure and cholesterol as well. Eye exam is coming up in Dec   Follow-up with Dr. Fransico Him is in January where he will probably next A1c  2. Morbid obesity with BMI of 50.0-59.9, adult (HCC) Improved  Isabella Matthews is re-educated about the importance of exercise daily to help with weight management. A minumum of 30 minutes daily is recommended. Additionally, importance of healthy food choices  with portion control discussed.   Wt Readings from Last 3 Encounters:  12/10/18 (!) 329 lb (149.2 kg)  10/28/18 (!) 329 lb (149.2 kg)  10/17/18 (!) 340 lb (154.2 kg)     3. Hypertension associated with type 2 diabetes mellitus (HCC) Isabella Matthews is encouraged to maintain a well balanced diet that is low in salt. Controlled, continue current medication  regimen. No refills needed.  Additionally, she is also reminded that exercise is beneficial for heart health and control of  Blood pressure. 30-60 minutes daily is recommended-walking was suggested.  4. Hyperlipidemia associated with type 2 diabetes mellitus (HCC) Controlled, continue statin at this time.  Encourage heart healthy, low-fat diet.    Time:   Today, I have spent 10 minutes with the patient with telehealth technology discussing the above problems.     Medication Adjustments/Labs and Tests Ordered: Current medicines are reviewed at length with the patient today.  Concerns regarding medicines are outlined above.   Tests Ordered: No orders of the defined types were placed in this encounter.   Medication Changes: No orders of the defined types were placed in this encounter.   Disposition:  Follow up 5 months or  PRN   Signed, Freddy Finner, NP  12/10/2018 3:48 PM     [facility]

## 2018-12-10 NOTE — Patient Instructions (Signed)
  I appreciate the opportunity to provide you with care for your health and wellness. Today we discussed: Diabetes  Follow up: 5 months or as needed  No labs or referrals today  CONGRATULATIONS!!! You have loss weight and taken control over your overall health! Definitely something to be proud of! We are very happy and proud of you as well!  Please continue to focus on you and taking care of you. Please follow Dr Liliane Channel great advice and the nutrition and lifestyle changes. You are gonna rock this DM into control!  HAPPY BIRTHDAY!!!   I hope you have a wonderful, happy, safe, and healthy Holiday Season! See you in the New Year :)  Please continue to practice social distancing to keep you, your family, and our community safe.  If you must go out, please wear a mask and practice good handwashing.  It was a pleasure to see you and I look forward to continuing to work together on your health and well-being. Please do not hesitate to call the office if you need care or have questions about your care.  Have a wonderful day and week. With Gratitude, Cherly Beach, DNP, AGNP-BC

## 2018-12-11 ENCOUNTER — Ambulatory Visit: Payer: No Typology Code available for payment source | Admitting: Family Medicine

## 2018-12-25 ENCOUNTER — Other Ambulatory Visit: Payer: Self-pay | Admitting: Family Medicine

## 2018-12-25 LAB — HM DIABETES EYE EXAM

## 2019-01-10 ENCOUNTER — Other Ambulatory Visit: Payer: Self-pay | Admitting: "Endocrinology

## 2019-01-13 MED ORDER — TRULICITY 1.5 MG/0.5ML ~~LOC~~ SOAJ: 1.5000 mg | SUBCUTANEOUS | 1 refills | Status: DC

## 2019-01-28 ENCOUNTER — Other Ambulatory Visit: Payer: Self-pay | Admitting: Family Medicine

## 2019-02-03 ENCOUNTER — Other Ambulatory Visit: Payer: Self-pay

## 2019-02-03 MED ORDER — GLIPIZIDE ER 5 MG PO TB24: 5.0000 mg | ORAL_TABLET | Freq: Every day | ORAL | 0 refills | Status: DC

## 2019-02-05 ENCOUNTER — Other Ambulatory Visit: Payer: Self-pay | Admitting: "Endocrinology

## 2019-02-05 ENCOUNTER — Telehealth: Payer: Self-pay | Admitting: "Endocrinology

## 2019-02-05 DIAGNOSIS — Z794 Long term (current) use of insulin: Secondary | ICD-10-CM

## 2019-02-05 DIAGNOSIS — E782 Mixed hyperlipidemia: Secondary | ICD-10-CM

## 2019-02-05 DIAGNOSIS — E1121 Type 2 diabetes mellitus with diabetic nephropathy: Secondary | ICD-10-CM

## 2019-02-05 NOTE — Telephone Encounter (Signed)
done

## 2019-02-05 NOTE — Telephone Encounter (Signed)
Patient is working with Covid and [facility] and do a phone visit in 2 weeks. Can you place the order?

## 2019-02-07 ENCOUNTER — Ambulatory Visit: Payer: No Typology Code available for payment source | Admitting: "Endocrinology

## 2019-02-15 LAB — COMPLETE METABOLIC PANEL WITH GFR
AG Ratio: 1.3 (calc) (ref 1.0–2.5)
ALT: 13 U/L (ref 6–29)
AST: 13 U/L (ref 10–35)
Albumin: 4.1 g/dL (ref 3.6–5.1)
Alkaline phosphatase (APISO): 64 U/L (ref 37–153)
BUN: 22 mg/dL (ref 7–25)
CO2: 29 mmol/L (ref 20–32)
Calcium: 10 mg/dL (ref 8.6–10.4)
Chloride: 103 mmol/L (ref 98–110)
Creat: 0.7 mg/dL (ref 0.50–1.05)
GFR, Est African American: 117 mL/min/{1.73_m2} (ref >=60)
GFR, Est Non African American: 101 mL/min/{1.73_m2} (ref >=60)
Globulin: 3.2 g/dL (calc) (ref 1.9–3.7)
Glucose, Bld: 116 mg/dL — ABNORMAL HIGH (ref 65–99)
Potassium: 4.6 mmol/L (ref 3.5–5.3)
Sodium: 141 mmol/L (ref 135–146)
Total Bilirubin: 0.5 mg/dL (ref 0.2–1.2)
Total Protein: 7.3 g/dL (ref 6.1–8.1)

## 2019-02-15 LAB — TSH: TSH: 0.88 mIU/L

## 2019-02-15 LAB — HEMOGLOBIN A1C
Hgb A1c MFr Bld: 6.6 % of total Hgb — ABNORMAL HIGH (ref <5.7)
Mean Plasma Glucose: 143 (calc)
eAG (mmol/L): 7.9 (calc)

## 2019-02-15 LAB — T4, FREE: Free T4: 1.2 ng/dL (ref 0.8–1.8)

## 2019-02-15 LAB — VITAMIN D 25 HYDROXY (VIT D DEFICIENCY, FRACTURES): Vit D, 25-Hydroxy: 28 ng/mL — ABNORMAL LOW (ref 30–100)

## 2019-02-18 ENCOUNTER — Ambulatory Visit (INDEPENDENT_AMBULATORY_CARE_PROVIDER_SITE_OTHER): Payer: No Typology Code available for payment source | Admitting: "Endocrinology

## 2019-02-18 ENCOUNTER — Encounter: Payer: Self-pay | Admitting: "Endocrinology

## 2019-02-18 ENCOUNTER — Other Ambulatory Visit: Payer: Self-pay | Admitting: "Endocrinology

## 2019-02-18 DIAGNOSIS — E1121 Type 2 diabetes mellitus with diabetic nephropathy: Secondary | ICD-10-CM

## 2019-02-18 DIAGNOSIS — E782 Mixed hyperlipidemia: Secondary | ICD-10-CM

## 2019-02-18 DIAGNOSIS — E559 Vitamin D deficiency, unspecified: Secondary | ICD-10-CM

## 2019-02-18 DIAGNOSIS — R5383 Other fatigue: Secondary | ICD-10-CM

## 2019-02-18 DIAGNOSIS — Z794 Long term (current) use of insulin: Secondary | ICD-10-CM

## 2019-02-18 DIAGNOSIS — I10 Essential (primary) hypertension: Secondary | ICD-10-CM

## 2019-02-18 MED ORDER — VITAMIN D3 125 MCG (5000 UT) PO CAPS: 5000.0000 [IU] | ORAL_CAPSULE | Freq: Every day | ORAL | 0 refills | Status: DC

## 2019-02-18 NOTE — Progress Notes (Signed)
02/18/2019, 1:08 PM                                                     Endocrinology Telehealth Visit Follow up Note -During COVID -19 Pandemic  This visit type was conducted due to national recommendations for restrictions regarding the COVID-19 Pandemic  in an effort to limit this patient's exposure and mitigate transmission of the corona virus.  Due to her co-morbid illnesses, Isabella Matthews is at  moderate to high risk for complications without adequate follow up.  This format is felt to be most appropriate for her at this time.  I connected with this patient on 02/18/2019   by telephone and verified that I am speaking with the correct person using two identifiers. Isabella Matthews, 25-Aug-1970. she has verbally consented to this visit. All issues noted in this document were discussed and addressed. The format was not optimal for physical exam.   Subjective:    Patient ID: Isabella Matthews, female    DOB: 09-14-1970.  [name] is engaged in telehealth via telephone after she was  seen in consultation for management of currently uncontrolled symptomatic diabetes requested by  Kerri Perches, MD.   Past Medical History:  Diagnosis Date  . Aortic stenosis, mild   . Aortic valve disorders   . Diabetes mellitus   . Edema   . Essential hypertension   . Morbid obesity (HCC)   . Onychomycosis of toenail     Past Surgical History:  Procedure Laterality Date  . CESAREAN SECTION     x2    Social History   Socioeconomic History  . Marital status: Single    Spouse name: Not on file  . Number of children: Not on file  . Years of education: Not on file  . Highest education level: Not on file  Occupational History  . Occupation: CNA - [employer]   Tobacco Use  . Smoking status: Former Smoker    Packs/day: 0.50    Types: Cigarettes    Quit date: 11/23/2014    Years since quitting: 4.2  . Smokeless tobacco: Never Used  Substance and Sexual  Activity  . Alcohol use: Yes    Alcohol/week: 0.0 standard drinks    Comment: occasionally  . Drug use: No  . Sexual activity: Not on file  Other Topics Concern  . Not on file  Social History Narrative  . Not on file   Social Determinants of Health   Financial Resource Strain:   . Difficulty of Paying Living Expenses: Not on file  Food Insecurity:   . Worried About Programme researcher, broadcasting/film/video in the Last Year: Not on file  . Ran Out of Food in the Last Year: Not on file  Transportation Needs:   . Lack of Transportation (Medical): Not on file  . Lack of Transportation (Non-Medical): Not on file  Physical Activity:   . Days of Exercise per Week: Not on file  . Minutes of Exercise per Session: Not on file  Stress:   . Feeling of Stress : Not on file  Social Connections:   . Frequency of Communication with Friends and Family: Not on file  . Frequency of Social Gatherings with Friends and Family: Not on file  . Attends Religious Services:  Not on file  . Active Member of Clubs or Organizations: Not on file  . Attends Banker Meetings: Not on file  . Marital Status: Not on file    Family History  Problem Relation Age of Onset  . Hypertension Mother   . Hypertension Father   . Diabetes Father     Outpatient Encounter Medications as of 02/18/2019  Medication Sig  . Cholecalciferol (VITAMIN D3) 125 MCG (5000 UT) CAPS Take 1 capsule (5,000 Units total) by mouth daily.  . Dulaglutide (TRULICITY) 1.5 MG/0.5ML SOPN Inject 1.5 mg into the skin once a week.  . gabapentin (NEURONTIN) 300 MG capsule Take 1 capsule by mouth at bedtime  . glipiZIDE (GLUCOTROL XL) 5 MG 24 hr tablet Take 1 tablet (5 mg total) by mouth daily with breakfast.  . hydrochlorothiazide (HYDRODIURIL) 25 MG tablet Take 1 tablet by mouth once daily  . Insulin Glargine (LANTUS SOLOSTAR) 100 UNIT/ML Solostar Pen Inject 60 Units into the skin at bedtime.  Marland Kitchen losartan (COZAAR) 50 MG tablet Take 1 tablet by mouth  once daily  . nystatin (MYCOSTATIN/NYSTOP) powder APPLY TO AFFECTED AREA(S) FOUR TIMES DAILY FOR 1 WEEK, THEN AS NEEDED, FOR RASH (Patient taking differently: Apply topically as needed (for rash/irritation). )  . oxymetazoline (AFRIN NODRIP EXTRA MOISTURE) 0.05 % nasal spray Place 1 spray into both nostrils 2 (two) times daily as needed for congestion.  . pravastatin (PRAVACHOL) 20 MG tablet Take 1 tablet by mouth once daily   No facility-administered encounter medications on file as of 02/18/2019.    ALLERGIES: Allergies  Allergen Reactions  . Penicillins Swelling    Has patient had a PCN reaction causing immediate rash, facial/tongue/throat swelling, SOB or lightheadedness with hypotension: Yes Has patient had a PCN reaction causing severe rash involving mucus membranes or skin necrosis: No Has patient had a PCN reaction that required hospitalization: No Has patient had a PCN reaction occurring within the last 10 years: Yes If all of the above answers are "NO", then may proceed with Cephalosporin use. Leg swelling/redness  . Ace Inhibitors Cough  . Metformin And Related Nausea Only  . Tramadol Rash    Scant rash on hands  After starting tramadol for knee pain in Jan 16, 2013    VACCINATION STATUS: Immunization History  Administered Date(s) Administered  . Influenza Split 10/27/2013  . Influenza Whole 12/29/2005, 09/29/2009  . Influenza,inj,Quad PF,6+ Mos 10/17/2012, 10/24/2016, 10/01/2018  . Pneumococcal Polysaccharide-23 01/12/2006, 07/09/2015  . Tdap 08/12/2010    Diabetes She presents for her follow-up diabetic visit. She has type 2 diabetes mellitus. Onset time: She was diagnosed at approximate age of 49 years, she did have an episode of gestational diabetes. Her disease course has been improving. There are no hypoglycemic associated symptoms. Pertinent negatives for hypoglycemia include no confusion, headaches, pallor or seizures. Pertinent negatives for diabetes include no  chest pain, no fatigue, no polydipsia, no polyphagia and no polyuria. There are no hypoglycemic complications. Symptoms are improving. Diabetic complications include peripheral neuropathy. Risk factors for coronary artery disease include diabetes mellitus, dyslipidemia, family history, hypertension, obesity and sedentary lifestyle. Current diabetic treatment includes insulin injections and oral agent (monotherapy). She is following a generally unhealthy diet. When asked about meal planning, she reported none. She has not had a previous visit with a dietitian. She rarely participates in exercise. Her breakfast blood glucose range is generally 140-180 mg/dl. Her bedtime blood glucose range is generally 140-180 mg/dl. Her overall blood glucose range is 140-180 mg/dl. (  She reports near target glycemic profile both fasting and postprandial.  Her previsit labs show A1c of 6.6% improving from 10.5%.  She denies hypoglycemic episodes. ) An ACE inhibitor/angiotensin II receptor blocker is being taken. Eye exam is current.  Hyperlipidemia This is a chronic problem. The current episode started more than 1 year ago. The problem is controlled. Exacerbating diseases include diabetes and obesity. Pertinent negatives include no chest pain, myalgias or shortness of breath. Current antihyperlipidemic treatment includes statins. Risk factors for coronary artery disease include dyslipidemia, family history, obesity, hypertension, a sedentary lifestyle and diabetes mellitus.  Hypertension This is a chronic problem. The current episode started more than 1 year ago. Pertinent negatives include no chest pain, headaches, palpitations or shortness of breath. Risk factors for coronary artery disease include diabetes mellitus, dyslipidemia, obesity and sedentary lifestyle. Past treatments include angiotensin blockers.    Review of systems: Limited as above.  Objective:    There were no vitals taken for this visit.  Wt Readings  from Last 3 Encounters:  12/10/18 (!) 329 lb (149.2 kg)  10/28/18 (!) 329 lb (149.2 kg)  10/17/18 (!) 340 lb (154.2 kg)      CMP ( most recent) CMP     Component Value Date/Time   NA 141 02/14/2019 0848   K 4.6 02/14/2019 0848   CL 103 02/14/2019 0848   CO2 29 02/14/2019 0848   GLUCOSE 116 (H) 02/14/2019 0848   BUN 22 02/14/2019 0848   CREATININE 0.70 02/14/2019 0848   CALCIUM 10.0 02/14/2019 0848   PROT 7.3 02/14/2019 0848   ALBUMIN 3.9 03/16/2016 0808   AST 13 02/14/2019 0848   ALT 13 02/14/2019 0848   ALKPHOS 52 03/16/2016 0808   BILITOT 0.5 02/14/2019 0848   GFRNONAA 101 02/14/2019 0848   GFRAA 117 02/14/2019 0848     Diabetic Labs (most recent): Lab Results  Component Value Date   HGBA1C 6.6 (H) 02/14/2019   HGBA1C 10.5 (H) 09/13/2018   HGBA1C 7.6 (H) 03/21/2018     Lipid Panel ( most recent) Lipid Panel     Component Value Date/Time   CHOL 174 03/21/2018 0952   TRIG 142 03/21/2018 0952   HDL 60 03/21/2018 0952   CHOLHDL 2.9 03/21/2018 0952   VLDL 22 03/16/2016 0808   LDLCALC 90 03/21/2018 0952      Lab Results  Component Value Date   TSH 0.88 02/14/2019   TSH 0.96 01/27/2017   TSH 1.18 11/05/2015   TSH 1.603 07/10/2014   TSH 1.152 10/14/2012   TSH 1.202 10/14/2012   TSH 0.437 04/20/2011   TSH 1.274 04/06/2009   TSH 1.394 07/11/2007   FREET4 1.2 02/14/2019     Assessment & Plan:   1. Type 2 diabetes mellitus with diabetic nephropathy, with long-term current use of insulin (HCC)   - MORAYO LEVEN has currently uncontrolled symptomatic type 2 DM since  49 years of age. She reports near target glycemic profile both fasting and postprandial.  Her previsit labs show A1c of 6.6% improving from 10.5%.  She denies hypoglycemic episodes. -Her recent labs were reviewed with her. - I had a long discussion with her about the progressive nature of diabetes and the pathology behind its complications. -her diabetes is complicated by neuropathy,  obesity/sedentary life and she remains at a high risk for more acute and chronic complications which include CAD, CVA, CKD, retinopathy, and neuropathy. These are all discussed in detail with her.  - I have counseled her on  diet  and weight management  by adopting a carbohydrate restricted/protein rich diet. Patient is encouraged to switch to  unprocessed or minimally processed     complex starch and increased protein intake (animal or plant source), fruits, and vegetables. -  she is advised to stick to a routine mealtimes to eat 3 meals  a day and avoid unnecessary snacks ( to snack only to correct hypoglycemia).   - she  admits there is a room for improvement in her diet and drink choices. -  Suggestion is made for her to avoid simple carbohydrates  from her diet including Cakes, Sweet Desserts / Pastries, Ice Cream, Soda (diet and regular), Sweet Tea, Candies, Chips, Cookies, Sweet Pastries,  Store Bought Juices, Alcohol in Excess of  1-2 drinks a day, Artificial Sweeteners, Coffee Creamer, and "Sugar-free" Products. This will help patient to have stable blood glucose profile and potentially avoid unintended weight gain.   - she will be scheduled with Jearld Fenton, RDN, CDE for diabetes education.  - I have approached her with the following individualized plan to manage  her diabetes and patient agrees:   -Given her current glycemic response, she will not need prandial insulin for now. -She is advised to continue  Lantus 60 units nightly, associated with monitoring of blood glucose 2 times a day-before BKF and at bedtime.  - she is warned not to take insulin without proper monitoring per orders. - she is encouraged to call clinic for blood glucose levels less than 70 or above 300 mg /dl. -She is advised to continue glipizide 5 mg XL p.o. daily at breakfast.   -She is advised to continue Trulicity 1.5 mg subcutaneously weekly.  -She reports GI intolerance from metformin.  - Specific targets  for  A1c;  LDL, HDL, Triglycerides, and  Waist Circumference were discussed with the patient.  2) Blood Pressure /Hypertension:  she is advised to home monitor blood pressure and report if > 140/90 on 2 separate readings.   she is advised to continue her current medications including 50 mg p.o. daily with breakfast .  3) Lipids/Hyperlipidemia:   Review of her recent lipid panel showed uncontrolled  LDL at 90 .  she  is advised to continue atorvastatin 20 mg p.o. daily at bedtime.  Side effects and precautions discussed with her.  4)  Weight/Diet: Her BMI is 73-   clearly complicating her diabetes care.   she is  a candidate for weight loss. I discussed with her the fact that loss of 5 - 10% of her  current body weight will have the most impact on her diabetes management.  Exercise, and detailed carbohydrates information provided  -  detailed on discharge instructions. -She may benefit from bariatric surgery, will be discussed in more detail on subsequent visit.  5) Chronic Care/Health Maintenance:  -she  is on ACEI/ARB and Statin medications and  is encouraged to initiate and continue to follow up with Ophthalmology, Dentist,  Podiatrist at least yearly or according to recommendations, and advised to   stay away from smoking. I have recommended yearly flu vaccine and pneumonia vaccine at least every 5 years; moderate intensity exercise for up to 150 minutes weekly; and  sleep for at least 7 hours a day.  - she is  advised to maintain close follow up with Fayrene Helper, MD for primary care needs, as well as her other providers for optimal and coordinated care.  - Time spent on this patient care encounter:  35  min, of which >50% was spent in  counseling and the rest reviewing her  current and  previous labs/studies ( including abstraction from other facilities),  previous treatments, her blood glucose readings, and medications' doses and developing a plan for long-term care based on the latest  recommendations for standards of care; and documenting her care.  Celesta AverLinda D Poehlman participated in the discussions, expressed understanding, and voiced agreement with the above plans.  All questions were answered to her satisfaction. she is encouraged to contact clinic should she have any questions or concerns prior to her return visit.  Follow up plan: - Return in about 4 months (around 06/18/2019) for Follow up with Pre-visit Labs, Meter, and Logs.  Marquis LunchGebre Nida, MD [facility] [address] [place], [place] [address] Phone: [phone]  Fax: [phone]    02/18/2019, 1:08 PM  This note was partially dictated with voice recognition software. Similar sounding words can be transcribed inadequately or may not  be corrected upon review.

## 2019-02-18 NOTE — Patient Instructions (Addendum)
                                     Advice for Weight Management  -For most of us the best way to lose weight is by diet management. Generally speaking, diet management means consuming less calories intentionally which over time brings about progressive weight loss.  This can be achieved more effectively by restricting carbohydrate consumption to the minimum possible.  So, it is critically important to know your numbers: how much calorie you are consuming and how much calorie you need. More importantly, our carbohydrates sources should be unprocessed or minimally processed complex starch food items.   Sometimes, it is important to balance nutrition by increasing protein intake (animal or plant source), fruits, and vegetables.  -Sticking to a routine mealtime to eat 3 meals a day and avoiding unnecessary snacks is shown to have a big role in weight control. Under normal circumstances, the only time we lose real weight is when we are hungry, so allow hunger to take place- hunger means no food between meal times, only water.  It is not advisable to starve.   -It is better to avoid simple carbohydrates including: Cakes, Sweet Desserts, Ice Cream, Soda (diet and regular), Sweet Tea, Candies, Chips, Cookies, Store Bought Juices, Alcohol in Excess of  1-2 drinks a day, Artificial Sweeteners, Doughnuts, Coffee Creamers, "Sugar-free" Products, etc, etc.  This is not a complete list.....    -Consulting with certified diabetes educators is proven to provide you with the most accurate and current information on diet.  Also, you may be  interested in discussing diet options/exchanges , we can schedule a visit with Penny Crumpton, RDN, CDE for individualized nutrition education.  -Exercise: If you are able: 30 -60 minutes a day ,4 days a week, or 150 minutes a week.  The longer the better.  Combine stretch, strength, and aerobic activities.  If you were told in the past that you  have high risk for cardiovascular diseases, you may seek evaluation by your heart doctor prior to initiating moderate to intense exercise programs.                                  Additional Care Considerations for Diabetes   -Diabetes  is a chronic disease.  The most important care consideration is regular follow-up with your diabetes care provider with the goal being avoiding or delaying its complications and to take advantage of advances in medications and technology.    -Type 2 diabetes is known to coexist with other important comorbidities such as high blood pressure and high cholesterol.  It is critical to control not only the diabetes but also the high blood pressure and high cholesterol to minimize and delay the risk of complications including coronary artery disease, stroke, amputations, blindness, etc.    - Studies showed that people with diabetes will benefit from a class of medications known as ACE inhibitors and statins.  Unless there are specific reasons not to be on these medications, the standard of care is to consider getting one from these groups of medications at an optimal doses.  These medications are generally considered safe and proven to help protect the heart and the kidneys.    - People with diabetes are encouraged to initiate and maintain regular follow-up with eye doctors, foot doctors, dentists ,   and if necessary heart and kidney doctors.     - It is highly recommended that people with diabetes quit smoking or stay away from smoking, and get yearly  flu vaccine and pneumonia vaccine at least every 5 years.  One other important lifestyle recommendation is to ensure adequate sleep - at least 6-7 hours of uninterrupted sleep at night.  -Exercise: If you are able: 30 -60 minutes a day, 4 days a week, or 150 minutes a week.  The longer the better.  Combine stretch, strength, and aerobic activities.  If you were told in the past that you have high risk for cardiovascular  diseases, you may seek evaluation by your heart doctor prior to initiating moderate to intense exercise programs.     COVID-19 Vaccine Information can be found at: [email or web address] For questions related to vaccine distribution or appointments, please email [email or web address] or call [phone].        

## 2019-02-20 ENCOUNTER — Ambulatory Visit: Payer: No Typology Code available for payment source | Admitting: "Endocrinology

## 2019-03-11 ENCOUNTER — Other Ambulatory Visit: Payer: Self-pay | Admitting: Family Medicine

## 2019-03-19 ENCOUNTER — Other Ambulatory Visit: Payer: Self-pay | Admitting: Family Medicine

## 2019-03-31 ENCOUNTER — Other Ambulatory Visit: Payer: Self-pay

## 2019-03-31 ENCOUNTER — Ambulatory Visit (HOSPITAL_COMMUNITY)
Admission: RE | Admit: 2019-03-31 | Discharge: 2019-03-31 | Disposition: A | Discharge status: Home / Self Care | Payer: No Typology Code available for payment source | Source: Ambulatory Visit | Attending: Family Medicine | Admitting: Family Medicine

## 2019-03-31 DIAGNOSIS — Z1231 Encounter for screening mammogram for malignant neoplasm of breast: Secondary | ICD-10-CM | POA: Diagnosis present

## 2019-04-08 ENCOUNTER — Other Ambulatory Visit: Payer: Self-pay | Admitting: "Endocrinology

## 2019-04-08 DIAGNOSIS — E1121 Type 2 diabetes mellitus with diabetic nephropathy: Secondary | ICD-10-CM

## 2019-04-28 ENCOUNTER — Other Ambulatory Visit: Payer: Self-pay | Admitting: "Endocrinology

## 2019-04-28 ENCOUNTER — Other Ambulatory Visit: Payer: Self-pay | Admitting: Family Medicine

## 2019-04-28 DIAGNOSIS — Z794 Long term (current) use of insulin: Secondary | ICD-10-CM

## 2019-04-28 DIAGNOSIS — E1121 Type 2 diabetes mellitus with diabetic nephropathy: Secondary | ICD-10-CM

## 2019-05-12 ENCOUNTER — Ambulatory Visit: Payer: No Typology Code available for payment source | Admitting: Family Medicine

## 2019-05-29 ENCOUNTER — Other Ambulatory Visit: Payer: Self-pay | Admitting: Family Medicine

## 2019-06-03 ENCOUNTER — Other Ambulatory Visit: Payer: Self-pay | Admitting: *Deleted

## 2019-06-03 MED ORDER — HYDROCHLOROTHIAZIDE 25 MG PO TABS: 25.0000 mg | ORAL_TABLET | Freq: Every day | ORAL | 0 refills | Status: DC

## 2019-06-19 ENCOUNTER — Ambulatory Visit: Payer: No Typology Code available for payment source | Admitting: "Endocrinology

## 2019-06-20 LAB — VITAMIN D 25 HYDROXY (VIT D DEFICIENCY, FRACTURES): Vit D, 25-Hydroxy: 22 ng/mL — ABNORMAL LOW (ref 30–100)

## 2019-06-20 LAB — COMPREHENSIVE METABOLIC PANEL
AG Ratio: 1.4 (calc) (ref 1.0–2.5)
ALT: 20 U/L (ref 6–29)
AST: 21 U/L (ref 10–35)
Albumin: 4.3 g/dL (ref 3.6–5.1)
Alkaline phosphatase (APISO): 69 U/L (ref 37–153)
BUN: 18 mg/dL (ref 7–25)
CO2: 27 mmol/L (ref 20–32)
Calcium: 9.8 mg/dL (ref 8.6–10.4)
Chloride: 104 mmol/L (ref 98–110)
Creat: 0.87 mg/dL (ref 0.50–1.05)
Globulin: 3 g/dL (calc) (ref 1.9–3.7)
Glucose, Bld: 138 mg/dL — ABNORMAL HIGH (ref 65–99)
Potassium: 4.1 mmol/L (ref 3.5–5.3)
Sodium: 140 mmol/L (ref 135–146)
Total Bilirubin: 0.6 mg/dL (ref 0.2–1.2)
Total Protein: 7.3 g/dL (ref 6.1–8.1)

## 2019-06-20 LAB — HEMOGLOBIN A1C
Hgb A1c MFr Bld: 5.6 % of total Hgb (ref <5.7)
Mean Plasma Glucose: 114 (calc)
eAG (mmol/L): 6.3 (calc)

## 2019-06-20 LAB — TSH: TSH: 0.98 mIU/L

## 2019-06-20 LAB — T4, FREE: Free T4: 1.2 ng/dL (ref 0.8–1.8)

## 2019-06-30 ENCOUNTER — Telehealth: Payer: Self-pay | Admitting: "Endocrinology

## 2019-06-30 ENCOUNTER — Other Ambulatory Visit: Payer: Self-pay | Admitting: Family Medicine

## 2019-06-30 DIAGNOSIS — E1121 Type 2 diabetes mellitus with diabetic nephropathy: Secondary | ICD-10-CM

## 2019-07-04 ENCOUNTER — Other Ambulatory Visit: Payer: Self-pay

## 2019-07-04 DIAGNOSIS — E1121 Type 2 diabetes mellitus with diabetic nephropathy: Secondary | ICD-10-CM

## 2019-07-04 MED ORDER — GLIPIZIDE ER 5 MG PO TB24: 5.0000 mg | ORAL_TABLET | Freq: Every day | ORAL | 0 refills | Status: DC

## 2019-07-04 MED ORDER — TRULICITY 1.5 MG/0.5ML ~~LOC~~ SOAJ: 1.5000 mg | SUBCUTANEOUS | 0 refills | Status: DC

## 2019-07-04 MED ORDER — LANTUS SOLOSTAR 100 UNIT/ML ~~LOC~~ SOPN: PEN_INJECTOR | SUBCUTANEOUS | 0 refills | Status: DC

## 2019-07-04 NOTE — Telephone Encounter (Signed)
Sent in

## 2019-07-04 NOTE — Telephone Encounter (Signed)
Patient has an appt on 7/22 and would like to know if these can be refilled as she is out.

## 2019-07-23 ENCOUNTER — Other Ambulatory Visit (HOSPITAL_COMMUNITY)
Admission: RE | Admit: 2019-07-23 | Discharge: 2019-07-23 | Disposition: A | Discharge status: Home / Self Care | Payer: PRIVATE HEALTH INSURANCE | Source: Other Acute Inpatient Hospital | Attending: Family Medicine | Admitting: Family Medicine

## 2019-07-23 ENCOUNTER — Other Ambulatory Visit: Payer: Self-pay

## 2019-07-23 ENCOUNTER — Encounter: Payer: Self-pay | Admitting: Internal Medicine

## 2019-07-23 ENCOUNTER — Encounter: Payer: Self-pay | Admitting: Family Medicine

## 2019-07-23 ENCOUNTER — Ambulatory Visit (INDEPENDENT_AMBULATORY_CARE_PROVIDER_SITE_OTHER): Payer: No Typology Code available for payment source | Admitting: Family Medicine

## 2019-07-23 VITALS — BP 158/96 | HR 90 | Resp 15 | Ht 63.0 in | Wt 352.0 lb

## 2019-07-23 DIAGNOSIS — E1121 Type 2 diabetes mellitus with diabetic nephropathy: Secondary | ICD-10-CM | POA: Diagnosis not present

## 2019-07-23 DIAGNOSIS — Z23 Encounter for immunization: Secondary | ICD-10-CM | POA: Diagnosis not present

## 2019-07-23 DIAGNOSIS — E785 Hyperlipidemia, unspecified: Secondary | ICD-10-CM

## 2019-07-23 DIAGNOSIS — Z6841 Body Mass Index (BMI) 40.0 and over, adult: Secondary | ICD-10-CM

## 2019-07-23 DIAGNOSIS — D619 Aplastic anemia, unspecified: Secondary | ICD-10-CM

## 2019-07-23 DIAGNOSIS — I10 Essential (primary) hypertension: Secondary | ICD-10-CM | POA: Diagnosis not present

## 2019-07-23 DIAGNOSIS — Z1211 Encounter for screening for malignant neoplasm of colon: Secondary | ICD-10-CM | POA: Diagnosis not present

## 2019-07-23 DIAGNOSIS — Z794 Long term (current) use of insulin: Secondary | ICD-10-CM

## 2019-07-23 DIAGNOSIS — E1169 Type 2 diabetes mellitus with other specified complication: Secondary | ICD-10-CM

## 2019-07-23 DIAGNOSIS — Z1159 Encounter for screening for other viral diseases: Secondary | ICD-10-CM

## 2019-07-23 DIAGNOSIS — D649 Anemia, unspecified: Secondary | ICD-10-CM

## 2019-07-23 MED ORDER — LOSARTAN POTASSIUM 50 MG PO TABS: 50.0000 mg | ORAL_TABLET | Freq: Every day | ORAL | 5 refills | Status: DC

## 2019-07-23 MED ORDER — LOSARTAN POTASSIUM 100 MG PO TABS: 100.0000 mg | ORAL_TABLET | Freq: Every day | ORAL | 3 refills | Status: DC

## 2019-07-23 NOTE — Patient Instructions (Addendum)
Annual physical with pap in  8 weeks, also re evaluate blood pressure and shingrix #2  Shingrix #1 tioday  Blood pressure is high, increase losartan to 100 mg once daily ( you may take two 50 mg tablets together every day till done.)NEW SCRIPT IS FOR 100 mg one daily HCTZ 25 mg one daily as before  Microalb today from office  Hepatitis screen, lipid and CBC today  Start calcium with D 600mg   One twice daily  It is important that you exercise regularly at least 30 minutes 5 times a week. If you develop chest pain, have severe difficulty breathing, or feel very tired, stop exercising immediately and seek medical attention   Need to count calories for weight loss , maximum of 1500 / day  We will provide sheet, still comnsider support from nutrtionist  CONGRATS on excellent blood sugar  You are referred for colonoscopuy  Foot exam today

## 2019-07-24 LAB — CBC
HCT: 32.6 % — ABNORMAL LOW (ref 35.0–45.0)
Hemoglobin: 11.4 g/dL — ABNORMAL LOW (ref 11.7–15.5)
MCH: 31.8 pg (ref 27.0–33.0)
MCHC: 35 g/dL (ref 32.0–36.0)
MCV: 91.1 fL (ref 80.0–100.0)
MPV: 10.5 fL (ref 7.5–12.5)
Platelets: 223 10*3/uL (ref 140–400)
RBC: 3.58 10*6/uL — ABNORMAL LOW (ref 3.80–5.10)
RDW: 12 % (ref 11.0–15.0)
WBC: 6.3 10*3/uL (ref 3.8–10.8)

## 2019-07-24 LAB — LIPID PANEL
Cholesterol: 192 mg/dL (ref <200)
HDL: 86 mg/dL (ref >=50)
LDL Cholesterol (Calc): 89 mg/dL (calc)
Non-HDL Cholesterol (Calc): 106 mg/dL (calc) (ref <130)
Total CHOL/HDL Ratio: 2.2 (calc) (ref <5.0)
Triglycerides: 80 mg/dL (ref <150)

## 2019-07-24 LAB — TEST AUTHORIZATION

## 2019-07-24 LAB — IRON, TOTAL/TOTAL IRON BINDING CAP
%SAT: 23 % (calc) (ref 16–45)
Iron: 88 ug/dL (ref 45–160)
TIBC: 385 mcg/dL (calc) (ref 250–450)

## 2019-07-24 LAB — HEPATITIS C ANTIBODY
Hepatitis C Ab: NONREACTIVE
SIGNAL TO CUT-OFF: 0.02 (ref <1.00)

## 2019-07-24 LAB — MICROALBUMIN, URINE: Microalb, Ur: 3 ug/mL — ABNORMAL HIGH

## 2019-07-25 NOTE — Assessment & Plan Note (Signed)
Controlled, no change in medication Isabella Matthews is reminded of the importance of commitment to daily physical activity for 30 minutes or more, as able and the need to limit carbohydrate intake to 30 to 60 grams per meal to help with blood sugar control.   The need to take medication as prescribed, test blood sugar as directed, and to call between visits if there is a concern that blood sugar is uncontrolled is also discussed.   Isabella Matthews is reminded of the importance of daily foot exam, annual eye examination, and good blood sugar, blood pressure and cholesterol control.  Diabetic Labs Latest Ref Rng & Units 07/23/2019 06/19/2019 02/14/2019 10/14/2018 09/13/2018  HbA1c <5.7 % of total Hgb - 5.6 6.6(H) - 10.5(H)  Microalbumin Not Estab. ug/mL 3.0(H) - - - -  Micro/Creat Ratio <30 mcg/mg creat - - - - -  Chol <200 mg/dL 182 - - - -  HDL > OR = 50 mg/dL 86 - - - -  Calc LDL mg/dL (calc) 89 - - - -  Triglycerides <150 mg/dL 80 - - - -  Creatinine 0.50 - 1.05 mg/dL - 9.93 7.16 9.67 -   BP/Weight 07/23/2019 12/10/2018 10/28/2018 10/17/2018 10/01/2018 08/30/2018 03/29/2018  Systolic BP 158 120 134 133 136 138 130  Diastolic BP 96 87 80 87 86 86 82  Wt. (Lbs) 352 329 329 340 340.12 342 352  BMI 62.35 58.28 58.28 60.23 60.25 60.58 62.35   Foot/eye exam completion dates Latest Ref Rng & Units 07/23/2019 12/25/2018  Eye Exam No Retinopathy - No Retinopathy  Foot exam Order - - -  Foot Form Completion - Done -

## 2019-07-25 NOTE — Assessment & Plan Note (Signed)
  Patient re-educated about  the importance of commitment to a  minimum of 150 minutes of exercise per week as able.  The importance of healthy food choices with portion control discussed, as well as eating regularly and within a 12 hour window most days. The need to choose "clean , green" food 50 to 75% of the time is discussed, as well as to make water the primary drink and set a goal of 64 ounces water daily.    Weight /BMI 07/23/2019 12/10/2018 10/28/2018  WEIGHT 352 lb 329 lb 329 lb  HEIGHT 5\' 3"  5\' 3"  5\' 3"   BMI 62.35 kg/m2 58.28 kg/m2 58.28 kg/m2

## 2019-07-25 NOTE — Assessment & Plan Note (Signed)
Hyperlipidemia:Low fat diet discussed and encouraged.   Lipid Panel  Lab Results  Component Value Date   CHOL 192 07/23/2019   HDL 86 07/23/2019   LDLCALC 89 07/23/2019   TRIG 80 07/23/2019   CHOLHDL 2.2 07/23/2019   Controlled, no change in medication

## 2019-07-25 NOTE — Progress Notes (Signed)
Isabella Matthews     MRN: [id number]      DOB: 28-Jun-1970   HPI Isabella Matthews is here for follow up and re-evaluation of chronic medical conditions, medication management and review of any available recent lab and radiology data.  Preventive health is updated, specifically  Cancer screening and Immunization.   Questions or concerns regarding consultations or procedures which the PT has had in the interim are  addressed. The PT denies any adverse reactions to current medications since the last visit.  Concerned about weight , though blood sugar is good, weight continues to be a challenge , states she is inconsistent in exercise and diet, but wants to change this Denies polyuria, polydipsia, blurred vision , or hypoglycemic episodes.   ROS Denies recent fever or chills. Denies sinus pressure, nasal congestion, ear pain or sore throat. Denies chest congestion, productive cough or wheezing. Denies chest pains, palpitations and leg swelling Denies abdominal pain, nausea, vomiting,diarrhea or constipation.   Denies dysuria, frequency, hesitancy or incontinence. Denies joint pain, swelling and limitation in mobility. Denies headaches, seizures, numbness, or tingling. Denies depression, anxiety or insomnia. Denies skin break down or rash.   PE  BP (!) 158/96   Pulse 90   Resp 15   Ht 5\' 3"  (1.6 m)   Wt (!) 352 lb (159.7 kg)   SpO2 96%   BMI 62.35 kg/m   Patient alert and oriented and in no cardiopulmonary distress.  HEENT: No facial asymmetry, EOMI,     Neck supple .  Chest: Clear to auscultation bilaterally.  CVS: S1, S2 no murmurs, no S3.Regular rate.  ABD: Soft non tender.   Ext: No edema  MS: Adequate ROM spine, shoulders, hips and knees.  Skin: Intact, no ulcerations or rash noted.  Psych: Good eye contact, normal affect. Memory intact not anxious or depressed appearing.  CNS: CN 2-12 intact, power,  normal throughout.no focal deficits noted.   Assessment &  Plan Essential hypertension, benign Uncontrolled, inc losartan dose and re eval in 6 weeks DASH diet and commitment to daily physical activity for a minimum of 30 minutes discussed and encouraged, as a part of hypertension management. The importance of attaining a healthy weight is also discussed.  BP/Weight 07/23/2019 12/10/2018 10/28/2018 10/17/2018 10/01/2018 08/30/2018 03/29/2018  Systolic BP 158 120 134 133 136 138 130  Diastolic BP 96 87 80 87 86 86 82  Wt. (Lbs) 352 329 329 340 340.12 342 352  BMI 62.35 58.28 58.28 60.23 60.25 60.58 62.35       Morbid obesity with BMI of 50.0-59.9, adult (HCC)  Patient re-educated about  the importance of commitment to a  minimum of 150 minutes of exercise per week as able.  The importance of healthy food choices with portion control discussed, as well as eating regularly and within a 12 hour window most days. The need to choose "clean , green" food 50 to 75% of the time is discussed, as well as to make water the primary drink and set a goal of 64 ounces water daily.    Weight /BMI 07/23/2019 12/10/2018 10/28/2018  WEIGHT 352 lb 329 lb 329 lb  HEIGHT 5\' 3"  5\' 3"  5\' 3"   BMI 62.35 kg/m2 58.28 kg/m2 58.28 kg/m2      Hyperlipidemia associated with type 2 diabetes mellitus (HCC) Hyperlipidemia:Low fat diet discussed and encouraged.   Lipid Panel  Lab Results  Component Value Date   CHOL 192 07/23/2019   HDL 86 07/23/2019   LDLCALC  89 07/23/2019   TRIG 80 07/23/2019   CHOLHDL 2.2 07/23/2019   Controlled, no change in medication     Type 2 diabetes mellitus with diabetic nephropathy, with long-term current use of insulin (HCC) Controlled, no change in medication Isabella Matthews is reminded of the importance of commitment to daily physical activity for 30 minutes or more, as able and the need to limit carbohydrate intake to 30 to 60 grams per meal to help with blood sugar control.   The need to take medication as prescribed, test blood sugar  as directed, and to call between visits if there is a concern that blood sugar is uncontrolled is also discussed.   Isabella Matthews is reminded of the importance of daily foot exam, annual eye examination, and good blood sugar, blood pressure and cholesterol control.  Diabetic Labs Latest Ref Rng & Units 07/23/2019 06/19/2019 02/14/2019 10/14/2018 09/13/2018  HbA1c <5.7 % of total Hgb - 5.6 6.6(H) - 10.5(H)  Microalbumin Not Estab. ug/mL 3.0(H) - - - -  Micro/Creat Ratio <30 mcg/mg creat - - - - -  Chol <200 mg/dL 253 - - - -  HDL > OR = 50 mg/dL 86 - - - -  Calc LDL mg/dL (calc) 89 - - - -  Triglycerides <150 mg/dL 80 - - - -  Creatinine 0.50 - 1.05 mg/dL - 6.64 4.03 4.74 -   BP/Weight 07/23/2019 12/10/2018 10/28/2018 10/17/2018 10/01/2018 08/30/2018 03/29/2018  Systolic BP 158 120 134 133 136 138 130  Diastolic BP 96 87 80 87 86 86 82  Wt. (Lbs) 352 329 329 340 340.12 342 352  BMI 62.35 58.28 58.28 60.23 60.25 60.58 62.35   Foot/eye exam completion dates Latest Ref Rng & Units 07/23/2019 12/25/2018  Eye Exam No Retinopathy - No Retinopathy  Foot exam Order - - -  Foot Form Completion - Done -

## 2019-07-25 NOTE — Assessment & Plan Note (Signed)
Uncontrolled, inc losartan dose and re eval in 6 weeks DASH diet and commitment to daily physical activity for a minimum of 30 minutes discussed and encouraged, as a part of hypertension management. The importance of attaining a healthy weight is also discussed.  BP/Weight 07/23/2019 12/10/2018 10/28/2018 10/17/2018 10/01/2018 08/30/2018 03/29/2018  Systolic BP 158 120 134 133 136 138 130  Diastolic BP 96 87 80 87 86 86 82  Wt. (Lbs) 352 329 329 340 340.12 342 352  BMI 62.35 58.28 58.28 60.23 60.25 60.58 62.35

## 2019-08-01 ENCOUNTER — Telehealth: Payer: Self-pay | Admitting: *Deleted

## 2019-08-01 NOTE — Telephone Encounter (Signed)
Pt is wondering if she could do the cologuard instead of colonoscopy she really does not want to do the colonoscopy please advise

## 2019-08-02 NOTE — Telephone Encounter (Signed)
Colonoscopy  Is the better test to do, I recommend she do that

## 2019-08-04 ENCOUNTER — Telehealth: Payer: Self-pay | Admitting: Family Medicine

## 2019-08-04 NOTE — Telephone Encounter (Signed)
Pls arrange a fIT test instead for her as the screen as she is really scared, if this is normal rept in3 years, if abnormal then will need a colonoscopy. Tell her this follows recommendations so should be OK

## 2019-08-04 NOTE — Telephone Encounter (Signed)
Pt notified with verbal understanding  °

## 2019-08-04 NOTE — Telephone Encounter (Signed)
Pt is wondering if the colonoscopy is necessary because she really doesn't want to do the colonoscopy.

## 2019-08-05 ENCOUNTER — Other Ambulatory Visit: Payer: Self-pay

## 2019-08-05 DIAGNOSIS — Z1211 Encounter for screening for malignant neoplasm of colon: Secondary | ICD-10-CM

## 2019-08-05 NOTE — Telephone Encounter (Signed)
Cologuard ordered and patient aware.  

## 2019-08-14 ENCOUNTER — Other Ambulatory Visit: Payer: Self-pay

## 2019-08-14 ENCOUNTER — Encounter: Payer: Self-pay | Admitting: "Endocrinology

## 2019-08-14 ENCOUNTER — Ambulatory Visit (INDEPENDENT_AMBULATORY_CARE_PROVIDER_SITE_OTHER): Payer: No Typology Code available for payment source | Admitting: "Endocrinology

## 2019-08-14 VITALS — BP 136/82 | HR 93 | Ht 63.0 in | Wt 352.8 lb

## 2019-08-14 DIAGNOSIS — I10 Essential (primary) hypertension: Secondary | ICD-10-CM

## 2019-08-14 DIAGNOSIS — E782 Mixed hyperlipidemia: Secondary | ICD-10-CM

## 2019-08-14 DIAGNOSIS — E1121 Type 2 diabetes mellitus with diabetic nephropathy: Secondary | ICD-10-CM

## 2019-08-14 DIAGNOSIS — Z794 Long term (current) use of insulin: Secondary | ICD-10-CM

## 2019-08-14 DIAGNOSIS — E559 Vitamin D deficiency, unspecified: Secondary | ICD-10-CM | POA: Insufficient documentation

## 2019-08-14 MED ORDER — LANTUS SOLOSTAR 100 UNIT/ML ~~LOC~~ SOPN: 50.0000 [IU] | PEN_INJECTOR | Freq: Every day | SUBCUTANEOUS | 2 refills | Status: DC

## 2019-08-14 MED ORDER — VITAMIN D3 125 MCG (5000 UT) PO CAPS: 5000.0000 [IU] | ORAL_CAPSULE | Freq: Every day | ORAL | 0 refills | Status: AC

## 2019-08-14 NOTE — Progress Notes (Signed)
08/14/2019, 6:49 PM                                 Endocrinology follow-up note   Subjective:    Patient ID: Isabella Matthews, female    DOB: December 16, 1970.  Isabella Matthews is being seen in follow-up for  management of currently uncontrolled symptomatic diabetes requested by  Kerri Perches, MD.   Past Medical History:  Diagnosis Date  . Aortic stenosis, mild   . Aortic valve disorders   . Diabetes mellitus   . Edema   . Essential hypertension   . Morbid obesity (HCC)   . Onychomycosis of toenail     Past Surgical History:  Procedure Laterality Date  . CESAREAN SECTION     x2    Social History   Socioeconomic History  . Marital status: Single    Spouse name: Not on file  . Number of children: Not on file  . Years of education: Not on file  . Highest education level: Not on file  Occupational History  . Occupation: CNA - [employer]   Tobacco Use  . Smoking status: Former Smoker    Packs/day: 0.50    Types: Cigarettes    Quit date: 11/23/2014    Years since quitting: 4.7  . Smokeless tobacco: Never Used  Vaping Use  . Vaping Use: Never used  Substance and Sexual Activity  . Alcohol use: Yes    Alcohol/week: 0.0 standard drinks    Comment: occasionally  . Drug use: No  . Sexual activity: Not on file  Other Topics Concern  . Not on file  Social History Narrative  . Not on file   Social Determinants of Health   Financial Resource Strain:   . Difficulty of Paying Living Expenses:   Food Insecurity:   . Worried About Programme researcher, broadcasting/film/video in the Last Year:   . Barista in the Last Year:   Transportation Needs:   . Freight forwarder (Medical):   Marland Kitchen Lack of Transportation (Non-Medical):   Physical Activity:   . Days of Exercise per Week:   . Minutes of Exercise per Session:   Stress:   . Feeling of Stress :   Social Connections:   . Frequency of Communication with Friends and Family:   . Frequency of Social  Gatherings with Friends and Family:   . Attends Religious Services:   . Active Member of Clubs or Organizations:   . Attends Banker Meetings:   Marland Kitchen Marital Status:     Family History  Problem Relation Age of Onset  . Hypertension Mother   . Hypertension Father   . Diabetes Father     Outpatient Encounter Medications as of 08/14/2019  Medication Sig  . Cholecalciferol (VITAMIN D3) 125 MCG (5000 UT) CAPS Take 1 capsule (5,000 Units total) by mouth daily.  . Dulaglutide (TRULICITY) 1.5 MG/0.5ML SOPN Inject 0.5 mLs (1.5 mg total) into the skin once a week.  . gabapentin (NEURONTIN) 300 MG capsule Take 1 capsule by mouth at bedtime  . hydrochlorothiazide (HYDRODIURIL) 25 MG tablet Take 1 tablet (25 mg total) by mouth daily.  . insulin glargine (LANTUS SOLOSTAR) 100 UNIT/ML Solostar Pen Inject 50 Units into the skin at bedtime. INJECT 60 UNITS SUBCUTANEOUSLY AT BEDTIME  . losartan (COZAAR) 100 MG tablet Take 1 tablet (100 mg total) by  mouth daily.  . Multiple Vitamin (MULTIVITAMIN) tablet Take 1 tablet by mouth daily.  Marland Kitchen nystatin (MYCOSTATIN/NYSTOP) powder APPLY TO AFFECTED AREA(S) FOUR TIMES DAILY FOR 1 WEEK, THEN AS NEEDED, FOR RASH (Patient taking differently: Apply topically as needed (for rash/irritation). )  . oxymetazoline (AFRIN NODRIP EXTRA MOISTURE) 0.05 % nasal spray Place 1 spray into both nostrils 2 (two) times daily as needed for congestion.  . pravastatin (PRAVACHOL) 20 MG tablet Take 1 tablet by mouth once daily  . [DISCONTINUED] glipiZIDE (GLUCOTROL XL) 5 MG 24 hr tablet Take 1 tablet (5 mg total) by mouth daily with breakfast.  . [DISCONTINUED] insulin glargine (LANTUS SOLOSTAR) 100 UNIT/ML Solostar Pen INJECT 60 UNITS SUBCUTANEOUSLY AT BEDTIME   No facility-administered encounter medications on file as of 08/14/2019.    ALLERGIES: Allergies  Allergen Reactions  . Penicillins Swelling    Has patient had a PCN reaction causing immediate rash,  facial/tongue/throat swelling, SOB or lightheadedness with hypotension: Yes Has patient had a PCN reaction causing severe rash involving mucus membranes or skin necrosis: No Has patient had a PCN reaction that required hospitalization: No Has patient had a PCN reaction occurring within the last 10 years: Yes If all of the above answers are "NO", then may proceed with Cephalosporin use. Leg swelling/redness  . Ace Inhibitors Cough  . Metformin And Related Nausea Only  . Tramadol Rash    Scant rash on hands  After starting tramadol for knee pain in Jan 16, 2013    VACCINATION STATUS: Immunization History  Administered Date(s) Administered  . Influenza Split 10/27/2013  . Influenza Whole 12/29/2005, 09/29/2009  . Influenza,inj,Quad PF,6+ Mos 10/17/2012, 10/24/2016, 10/01/2018  . Moderna SARS-COVID-2 Vaccination 01/28/2019, 02/25/2019  . Pneumococcal Polysaccharide-23 01/12/2006, 07/09/2015  . Tdap 08/12/2010  . Zoster Recombinat (Shingrix) 07/23/2019    Diabetes She presents for her follow-up diabetic visit. She has type 2 diabetes mellitus. Onset time: She was diagnosed at approximate age of 70 years, she did have an episode of gestational diabetes. Her disease course has been improving. There are no hypoglycemic associated symptoms. Pertinent negatives for hypoglycemia include no confusion, headaches, pallor or seizures. Pertinent negatives for diabetes include no chest pain, no fatigue, no polydipsia, no polyphagia and no polyuria. There are no hypoglycemic complications. Symptoms are improving. Diabetic complications include peripheral neuropathy. Risk factors for coronary artery disease include diabetes mellitus, dyslipidemia, family history, hypertension, obesity and sedentary lifestyle. Current diabetic treatment includes insulin injections and oral agent (monotherapy). Her weight is increasing steadily. She is following a generally unhealthy diet. When asked about meal planning, she  reported none. She has not had a previous visit with a dietitian. She rarely participates in exercise. Her breakfast blood glucose range is generally 130-140 mg/dl. Her bedtime blood glucose range is generally 130-140 mg/dl. Her overall blood glucose range is 130-140 mg/dl. An ACE inhibitor/angiotensin II receptor blocker is being taken. Eye exam is current.  Hyperlipidemia This is a chronic problem. The current episode started more than 1 year ago. The problem is controlled. Exacerbating diseases include diabetes and obesity. Pertinent negatives include no chest pain, myalgias or shortness of breath. Current antihyperlipidemic treatment includes statins. Risk factors for coronary artery disease include dyslipidemia, family history, obesity, hypertension, a sedentary lifestyle and diabetes mellitus.  Hypertension This is a chronic problem. The current episode started more than 1 year ago. Pertinent negatives include no chest pain, headaches, palpitations or shortness of breath. Risk factors for coronary artery disease include diabetes mellitus, dyslipidemia, obesity and sedentary  lifestyle. Past treatments include angiotensin blockers.    Review of systems: Limited as above.  Objective:    BP (!) 136/82   Pulse 93   Ht 5\' 3"  (1.6 m)   Wt (!) 352 lb 12.8 oz (160 kg)   BMI 62.50 kg/m   Wt Readings from Last 3 Encounters:  08/14/19 (!) 352 lb 12.8 oz (160 kg)  07/23/19 (!) 352 lb (159.7 kg)  12/10/18 (!) 329 lb (149.2 kg)     Physical Exam- Limited  Constitutional:  Body mass index is 62.5 kg/m. , not in acute distress, normal state of mind Eyes:  EOMI, no exophthalmos Neck: Supple Thyroid: No gross goiter Respiratory: Adequate breathing efforts Musculoskeletal: no gross deformities, strength intact in all four extremities, no gross restriction of joint movements,+ bilateral lower extremity edema. Skin:  no rashes, no hyperemia Neurological: no tremor with outstretched hands,    CMP  ( most recent) CMP     Component Value Date/Time   NA 140 06/19/2019 0721   K 4.1 06/19/2019 0721   CL 104 06/19/2019 0721   CO2 27 06/19/2019 0721   GLUCOSE 138 (H) 06/19/2019 0721   BUN 18 06/19/2019 0721   CREATININE 0.87 06/19/2019 0721   CALCIUM 9.8 06/19/2019 0721   PROT 7.3 06/19/2019 0721   ALBUMIN 3.9 03/16/2016 0808   AST 21 06/19/2019 0721   ALT 20 06/19/2019 0721   ALKPHOS 52 03/16/2016 0808   BILITOT 0.6 06/19/2019 0721   GFRNONAA 101 02/14/2019 0848   GFRAA 117 02/14/2019 0848     Diabetic Labs (most recent): Lab Results  Component Value Date   HGBA1C 5.6 06/19/2019   HGBA1C 6.6 (H) 02/14/2019   HGBA1C 10.5 (H) 09/13/2018     Lipid Panel ( most recent) Lipid Panel     Component Value Date/Time   CHOL 192 07/23/2019 1010   TRIG 80 07/23/2019 1010   HDL 86 07/23/2019 1010   CHOLHDL 2.2 07/23/2019 1010   VLDL 22 03/16/2016 0808   LDLCALC 89 07/23/2019 1010      Lab Results  Component Value Date   TSH 0.98 06/19/2019   TSH 0.88 02/14/2019   TSH 0.96 01/27/2017   TSH 1.18 11/05/2015   TSH 1.603 07/10/2014   TSH 1.152 10/14/2012   TSH 1.202 10/14/2012   TSH 0.437 04/20/2011   TSH 1.274 04/06/2009   TSH 1.394 07/11/2007   FREET4 1.2 06/19/2019   FREET4 1.2 02/14/2019     Assessment & Plan:   1. Type 2 diabetes mellitus with diabetic nephropathy, with long-term current use of insulin (HCC)   - Isabella Matthews has currently uncontrolled symptomatic type 2 DM since  49 years of age. She presents with near target glycemic profile with some rare and random hypoglycemia.  Her recent A1c was 5.6%, overall improving from 10.5%.    -Her recent labs were reviewed with her. - I had a long discussion with her about the progressive nature of diabetes and the pathology behind its complications. -her diabetes is complicated by neuropathy, obesity/sedentary life and she remains at a high risk for more acute and chronic complications which include CAD,  CVA, CKD, retinopathy, and neuropathy. These are all discussed in detail with her.  - I have counseled her on diet  and weight management  by adopting a carbohydrate restricted/protein rich diet. Patient is encouraged to switch to  unprocessed or minimally processed     complex starch and increased protein intake (animal or plant source),  fruits, and vegetables. -  she is advised to stick to a routine mealtimes to eat 3 meals  a day and avoid unnecessary snacks ( to snack only to correct hypoglycemia).   - she  admits there is a room for improvement in her diet and drink choices. -  Suggestion is made for her to avoid simple carbohydrates  from her diet including Cakes, Sweet Desserts / Pastries, Ice Cream, Soda (diet and regular), Sweet Tea, Candies, Chips, Cookies, Sweet Pastries,  Store Bought Juices, Alcohol in Excess of  1-2 drinks a day, Artificial Sweeteners, Coffee Creamer, and "Sugar-free" Products. This will help patient to have stable blood glucose profile and potentially avoid unintended weight gain.  - she will be scheduled with Norm SaltPenny Crumpton, RDN, CDE for diabetes education.  - I have approached her with the following individualized plan to manage  her diabetes and patient agrees:   -Given her current glycemic response, she will not need prandial insulin for now. -She will benefit from de-escalation in her insulin to avoid inadvertent hypoglycemia.   -She is advised to lower her Lantus to 50 units nightly,  associated with monitoring of blood glucose 2 times a day-before BKF and at bedtime.  - she is warned not to take insulin without proper monitoring per orders. - she is encouraged to call clinic for blood glucose levels less than 70 or above 300 mg /dl. -She is advised to discontinue glipizide for now.     -She is advised to continue Trulicity 1.5 mg subcutaneously weekly.  -She reports GI intolerance from metformin.  - Specific targets for  A1c;  LDL, HDL, Triglycerides, and   Waist Circumference were discussed with the patient.  2) Blood Pressure /Hypertension:  -Her blood pressure is controlled to target.   she is advised to continue her current medications including 50 mg p.o. daily with breakfast .  3) Lipids/Hyperlipidemia:   Review of her recent lipid panel showed uncontrolled  LDL at 90 .  she  is advised to continue atorvastatin 20 mg p.o. daily at bedtime.   Side effects and precautions discussed with her.  4)  Weight/Diet: Her BMI is 1862-   clearly complicating her diabetes care.   she is  a candidate for weight loss. I discussed with her the fact that loss of 5 - 10% of her  current body weight will have the most impact on her diabetes management.  Exercise, and detailed carbohydrates information provided  -  detailed on discharge instructions. -She may benefit from bariatric surgery, will be discussed in more detail on subsequent visit.  5) Chronic Care/Health Maintenance:  -she  is on ACEI/ARB and Statin medications and  is encouraged to initiate and continue to follow up with Ophthalmology, Dentist,  Podiatrist at least yearly or according to recommendations, and advised to   stay away from smoking. I have recommended yearly flu vaccine and pneumonia vaccine at least every 5 years; moderate intensity exercise for up to 150 minutes weekly; and  sleep for at least 7 hours a day.  - she is  advised to maintain close follow up with Kerri PerchesSimpson, Margaret E, MD for primary care needs, as well as her other providers for optimal and coordinated care.  - Time spent on this patient care encounter:  35 min, of which > 50% was spent in  counseling and the rest reviewing her blood glucose logs , discussing her hypoglycemia and hyperglycemia episodes, reviewing her current and  previous labs / studies  (  including abstraction from other facilities) and medications  doses and developing a  long term treatment plan and documenting her care.   Please refer to Patient  Instructions for Blood Glucose Monitoring and Insulin/Medications Dosing Guide"  in media tab for additional information. Please  also refer to " Patient Self Inventory" in the Media  tab for reviewed elements of pertinent patient history.  Isabella Matthews participated in the discussions, expressed understanding, and voiced agreement with the above plans.  All questions were answered to her satisfaction. she is encouraged to contact clinic should she have any questions or concerns prior to her return visit.   Follow up plan: - Return in about 9 weeks (around 10/16/2019) for Bring Meter and Logs- A1c in Office.  Marquis Lunch, MD [facility] [address] [place], [place] [address] Phone: [phone]  Fax: [phone]    08/14/2019, 6:49 PM  This note was partially dictated with voice recognition software. Similar sounding words can be transcribed inadequately or may not  be corrected upon review.

## 2019-08-14 NOTE — Patient Instructions (Signed)

## 2019-08-19 ENCOUNTER — Encounter: Payer: Self-pay | Admitting: Family Medicine

## 2019-08-19 LAB — COLOGUARD: Cologuard: NEGATIVE

## 2019-08-21 ENCOUNTER — Other Ambulatory Visit: Payer: Self-pay

## 2019-08-21 DIAGNOSIS — E1121 Type 2 diabetes mellitus with diabetic nephropathy: Secondary | ICD-10-CM

## 2019-08-21 MED ORDER — LANTUS SOLOSTAR 100 UNIT/ML ~~LOC~~ SOPN: 50.0000 [IU] | PEN_INJECTOR | Freq: Every day | SUBCUTANEOUS | 2 refills | Status: DC

## 2019-08-25 LAB — COLOGUARD: Cologuard: NEGATIVE

## 2019-08-28 LAB — COLOGUARD: COLOGUARD: NEGATIVE

## 2019-09-09 ENCOUNTER — Other Ambulatory Visit: Payer: Self-pay | Admitting: Family Medicine

## 2019-09-10 ENCOUNTER — Other Ambulatory Visit: Payer: Self-pay | Admitting: Family Medicine

## 2019-09-25 ENCOUNTER — Other Ambulatory Visit: Payer: Self-pay | Admitting: "Endocrinology

## 2019-09-25 DIAGNOSIS — Z794 Long term (current) use of insulin: Secondary | ICD-10-CM

## 2019-09-30 ENCOUNTER — Other Ambulatory Visit: Payer: Self-pay | Admitting: Family Medicine

## 2019-10-08 ENCOUNTER — Other Ambulatory Visit: Payer: Self-pay

## 2019-10-08 ENCOUNTER — Ambulatory Visit (INDEPENDENT_AMBULATORY_CARE_PROVIDER_SITE_OTHER): Payer: No Typology Code available for payment source | Admitting: Family Medicine

## 2019-10-08 ENCOUNTER — Encounter: Payer: Self-pay | Admitting: Family Medicine

## 2019-10-08 ENCOUNTER — Other Ambulatory Visit (HOSPITAL_COMMUNITY)
Admission: RE | Admit: 2019-10-08 | Discharge: 2019-10-08 | Disposition: A | Discharge status: Home / Self Care | Payer: PRIVATE HEALTH INSURANCE | Source: Ambulatory Visit | Attending: Family Medicine | Admitting: Family Medicine

## 2019-10-08 VITALS — BP 138/82 | HR 87 | Resp 16 | Ht 63.0 in | Wt 339.0 lb

## 2019-10-08 DIAGNOSIS — Z124 Encounter for screening for malignant neoplasm of cervix: Secondary | ICD-10-CM | POA: Insufficient documentation

## 2019-10-08 DIAGNOSIS — E782 Mixed hyperlipidemia: Secondary | ICD-10-CM

## 2019-10-08 DIAGNOSIS — Z23 Encounter for immunization: Secondary | ICD-10-CM | POA: Diagnosis not present

## 2019-10-08 DIAGNOSIS — Z6841 Body Mass Index (BMI) 40.0 and over, adult: Secondary | ICD-10-CM

## 2019-10-08 DIAGNOSIS — E1121 Type 2 diabetes mellitus with diabetic nephropathy: Secondary | ICD-10-CM | POA: Diagnosis not present

## 2019-10-08 DIAGNOSIS — Z0000 Encounter for general adult medical examination without abnormal findings: Secondary | ICD-10-CM

## 2019-10-08 DIAGNOSIS — Z794 Long term (current) use of insulin: Secondary | ICD-10-CM

## 2019-10-08 MED ORDER — NYSTATIN 100000 UNIT/GM EX POWD: CUTANEOUS | 0 refills | Status: DC | PRN

## 2019-10-08 MED ORDER — HYDROCHLOROTHIAZIDE 25 MG PO TABS: 25.0000 mg | ORAL_TABLET | Freq: Every day | ORAL | 5 refills | Status: DC

## 2019-10-08 MED ORDER — TERBINAFINE HCL 250 MG PO TABS: 250.0000 mg | ORAL_TABLET | Freq: Every day | ORAL | 1 refills | Status: DC

## 2019-10-08 NOTE — Patient Instructions (Addendum)
F/U in office with mD in 4 months, call if you needme before  Flu vaccine and shingrix #2 today  1 2 weeks of medication for fungal nail infection is prescribed , take as directed  Congrats on weight loss, great blod sugar control   fasting lipid, cmp and eGFr 1 week before follow up in January  It is important that you exercise regularly at least 30 minutes 5 times a week. If you develop chest pain, have severe difficulty breathing, or feel very tired, stop exercising immediately and seek medical attention  Think about what you will eat, plan ahead. Choose " clean, green, fresh or frozen" over canned, processed or packaged foods which are more sugary, salty and fatty. 70 to 75% of food eaten should be vegetables and fruit. Three meals at set times with snacks allowed between meals, but they must be fruit or vegetables. Aim to eat over a 12 hour period , example 7 am to 7 pm, and STOP after  your last meal of the day. Drink water,generally about 64 ounces per day, no other drink is as healthy. Fruit juice is best enjoyed in a healthy way, by EATING the fruit. Thanks for choosing [facility], we consider it a privelige to serve you.

## 2019-10-09 ENCOUNTER — Ambulatory Visit: Payer: No Typology Code available for payment source | Admitting: Gastroenterology

## 2019-10-09 ENCOUNTER — Encounter: Payer: Self-pay | Admitting: Family Medicine

## 2019-10-09 NOTE — Progress Notes (Signed)
Isabella Matthews     MRN: [id number]      DOB: 11-06-1970  HPI: Patient is in for annual physical exam. No other health concerns are expressed or addressed at the visit. Recent labs, if available are reviewed. Immunization is reviewed , and  updated    PE: BP 138/82   Pulse 87   Resp 16   Ht 5\' 3"  (1.6 m)   Wt (!) 339 lb (153.8 kg)   SpO2 92%   BMI 60.05 kg/m   Pleasant  female, alert and oriented x 3, in no cardio-pulmonary distress. Afebrile. HEENT No facial trauma or asymetry. Sinuses non tender.  Extra occullar muscles intact.. External ears normal, . Neck: supple, no adenopathy,JVD or thyromegaly.No bruits.  Chest: Clear to ascultation bilaterally.No crackles or wheezes. Non tender to palpation  Breast: No asymetry,no masses or lumps. No tenderness. No nipple discharge or inversion. No axillary or supraclavicular adenopathy  Cardiovascular system; Heart sounds normal,  S1 and  S2 ,no S3.  No murmur, or thrill. Apical beat not displaced Peripheral pulses normal.  Abdomen: Soft, non tender, no organomegaly or masses. No bruits. Bowel sounds normal. No guarding, tenderness or rebound.   GU: External genitalia normal female genitalia , normal female distribution of hair. No lesions. Urethral meatus normal in size, no  Prolapse, no lesions visibly  Present. Bladder non tender. Vagina pink and moist , with no visible lesions , discharge present . Adequate pelvic support no  cystocele or rectocele noted Cervix pink and appears healthy, no lesions or ulcerations noted, no discharge noted from os Uterus normal size, no adnexal masses, no cervical motion or adnexal tenderness.   Musculoskeletal exam: Full ROM of spine, hips , shoulders and knees.  deformity ,swelling and  crepitus noted in knees No muscle wasting or atrophy.   Neurologic: Cranial nerves 2 to 12 intact. Power, tone ,sensation and reflexes normal throughout. No disturbance in gait. No  tremor.  Skin: Intact, no ulceration, erythema , tinea pedis and onychomycosis bilaterally Pigmentation normal throughout  Psych; Normal mood and affect. Judgement and concentration normal   Assessment & Plan:  Annual physical exam Annual exam as documented. Counseling done  re healthy lifestyle involving commitment to 150 minutes exercise per week, heart healthy diet, and attaining healthy weight.The importance of adequate sleep also discussed. Regular seat belt use and home safety, is also discussed. Changes in health habits are decided on by the patient with goals and time frames  set for achieving them. Immunization and cancer screening needs are specifically addressed at this visit.   Type 2 diabetes mellitus with diabetic nephropathy, with long-term current use of insulin (HCC) Controlled and managed by Endo [name] is reminded of the importance of commitment to daily physical activity for 30 minutes or more, as able and the need to limit carbohydrate intake to 30 to 60 grams per meal to help with blood sugar control.   The need to take medication as prescribed, test blood sugar as directed, and to call between visits if there is a concern that blood sugar is uncontrolled is also discussed.   [name] is reminded of the importance of daily foot exam, annual eye examination, and good blood sugar, blood pressure and cholesterol control.  Diabetic Labs Latest Ref Rng & Units 07/23/2019 06/19/2019 02/14/2019 10/14/2018 09/13/2018  HbA1c <5.7 % of total Hgb - 5.6 6.6(H) - 10.5(H)  Microalbumin Not Estab. ug/mL 3.0(H) - - - -  Micro/Creat Ratio <30 mcg/mg  creat - - - - -  Chol <200 mg/dL 366 - - - -  HDL > OR = 50 mg/dL 86 - - - -  Calc LDL mg/dL (calc) 89 - - - -  Triglycerides <150 mg/dL 80 - - - -  Creatinine 0.50 - 1.05 mg/dL - 4.40 3.47 4.25 -   BP/Weight 10/08/2019 08/14/2019 07/23/2019 12/10/2018 10/28/2018 10/17/2018 10/01/2018  Systolic BP 138 136 158 120 134 133 136    Diastolic BP 82 82 96 87 80 87 86  Wt. (Lbs) 339 352.8 352 329 329 340 340.12  BMI 60.05 62.5 62.35 58.28 58.28 60.23 60.25   Foot/eye exam completion dates Latest Ref Rng & Units 10/08/2019 07/23/2019  Eye Exam No Retinopathy - -  Foot exam Order - - -  Foot Form Completion - Done Done        Morbid obesity with BMI of 50.0-59.9, adult (HCC) Improved  Patient re-educated about  the importance of commitment to a  minimum of 150 minutes of exercise per week as able.  The importance of healthy food choices with portion control discussed, as well as eating regularly and within a 12 hour window most days. The need to choose "clean , green" food 50 to 75% of the time is discussed, as well as to make water the primary drink and set a goal of 64 ounces water daily.    Weight /BMI 10/08/2019 08/14/2019 07/23/2019  WEIGHT 339 lb 352 lb 12.8 oz 352 lb  HEIGHT 5\' 3"  5\' 3"  5\' 3"   BMI 60.05 kg/m2 62.5 kg/m2 62.35 kg/m2

## 2019-10-09 NOTE — Assessment & Plan Note (Signed)

## 2019-10-10 NOTE — Assessment & Plan Note (Signed)
Improved  Patient re-educated about  the importance of commitment to a  minimum of 150 minutes of exercise per week as able.  The importance of healthy food choices with portion control discussed, as well as eating regularly and within a 12 hour window most days. The need to choose "clean , green" food 50 to 75% of the time is discussed, as well as to make water the primary drink and set a goal of 64 ounces water daily.    Weight /BMI 10/08/2019 08/14/2019 07/23/2019  WEIGHT 339 lb 352 lb 12.8 oz 352 lb  HEIGHT 5\' 3"  5\' 3"  5\' 3"   BMI 60.05 kg/m2 62.5 kg/m2 62.35 kg/m2

## 2019-10-10 NOTE — Assessment & Plan Note (Signed)
Controlled and managed by Endo Ms. Isabella Matthews is reminded of the importance of commitment to daily physical activity for 30 minutes or more, as able and the need to limit carbohydrate intake to 30 to 60 grams per meal to help with blood sugar control.   The need to take medication as prescribed, test blood sugar as directed, and to call between visits if there is a concern that blood sugar is uncontrolled is also discussed.   Isabella Matthews is reminded of the importance of daily foot exam, annual eye examination, and good blood sugar, blood pressure and cholesterol control.  Diabetic Labs Latest Ref Rng & Units 07/23/2019 06/19/2019 02/14/2019 10/14/2018 09/13/2018  HbA1c <5.7 % of total Hgb - 5.6 6.6(H) - 10.5(H)  Microalbumin Not Estab. ug/mL 3.0(H) - - - -  Micro/Creat Ratio <30 mcg/mg creat - - - - -  Chol <200 mg/dL 387 - - - -  HDL > OR = 50 mg/dL 86 - - - -  Calc LDL mg/dL (calc) 89 - - - -  Triglycerides <150 mg/dL 80 - - - -  Creatinine 0.50 - 1.05 mg/dL - 5.64 3.32 9.51 -   BP/Weight 10/08/2019 08/14/2019 07/23/2019 12/10/2018 10/28/2018 10/17/2018 10/01/2018  Systolic BP 138 136 158 120 134 133 136  Diastolic BP 82 82 96 87 80 87 86  Wt. (Lbs) 339 352.8 352 329 329 340 340.12  BMI 60.05 62.5 62.35 58.28 58.28 60.23 60.25   Foot/eye exam completion dates Latest Ref Rng & Units 10/08/2019 07/23/2019  Eye Exam No Retinopathy - -  Foot exam Order - - -  Foot Form Completion - Done Done

## 2019-10-13 LAB — CYTOLOGY - PAP
Adequacy: ABSENT
Comment: NEGATIVE
Diagnosis: NEGATIVE
High risk HPV: NEGATIVE

## 2019-10-16 ENCOUNTER — Encounter: Payer: Self-pay | Admitting: "Endocrinology

## 2019-10-16 ENCOUNTER — Other Ambulatory Visit: Payer: Self-pay

## 2019-10-16 ENCOUNTER — Ambulatory Visit (INDEPENDENT_AMBULATORY_CARE_PROVIDER_SITE_OTHER): Payer: No Typology Code available for payment source | Admitting: "Endocrinology

## 2019-10-16 VITALS — BP 142/86 | HR 80 | Ht 63.0 in | Wt 336.6 lb

## 2019-10-16 DIAGNOSIS — E1121 Type 2 diabetes mellitus with diabetic nephropathy: Secondary | ICD-10-CM | POA: Diagnosis not present

## 2019-10-16 DIAGNOSIS — Z794 Long term (current) use of insulin: Secondary | ICD-10-CM

## 2019-10-16 DIAGNOSIS — E782 Mixed hyperlipidemia: Secondary | ICD-10-CM | POA: Diagnosis not present

## 2019-10-16 DIAGNOSIS — I10 Essential (primary) hypertension: Secondary | ICD-10-CM

## 2019-10-16 DIAGNOSIS — E559 Vitamin D deficiency, unspecified: Secondary | ICD-10-CM | POA: Diagnosis not present

## 2019-10-16 LAB — POCT GLYCOSYLATED HEMOGLOBIN (HGB A1C): Hemoglobin A1C: 6.5 % — AB (ref 4.0–5.6)

## 2019-10-16 NOTE — Patient Instructions (Signed)

## 2019-10-16 NOTE — Progress Notes (Signed)
10/16/2019, 8:54 AM                                 Endocrinology follow-up note   Subjective:    Patient ID: Isabella Matthews, female    DOB: 20-Nov-1970.  Isabella Matthews is being seen in follow-up for  management of currently uncontrolled symptomatic diabetes requested by  Kerri Perches, MD.   Past Medical History:  Diagnosis Date  . Aortic stenosis, mild   . Aortic valve disorders   . Diabetes mellitus   . Edema   . Essential hypertension   . Morbid obesity (HCC)   . Onychomycosis of toenail     Past Surgical History:  Procedure Laterality Date  . CESAREAN SECTION     x2    Social History   Socioeconomic History  . Marital status: Single    Spouse name: Not on file  . Number of children: Not on file  . Years of education: Not on file  . Highest education level: Not on file  Occupational History  . Occupation: CNA - [employer]   Tobacco Use  . Smoking status: Former Smoker    Packs/day: 0.50    Types: Cigarettes    Quit date: 11/23/2014    Years since quitting: 4.8  . Smokeless tobacco: Never Used  Vaping Use  . Vaping Use: Never used  Substance and Sexual Activity  . Alcohol use: Yes    Alcohol/week: 0.0 standard drinks    Comment: occasionally  . Drug use: No  . Sexual activity: Not on file  Other Topics Concern  . Not on file  Social History Narrative  . Not on file   Social Determinants of Health   Financial Resource Strain:   . Difficulty of Paying Living Expenses: Not on file  Food Insecurity:   . Worried About Programme researcher, broadcasting/film/video in the Last Year: Not on file  . Ran Out of Food in the Last Year: Not on file  Transportation Needs:   . Lack of Transportation (Medical): Not on file  . Lack of Transportation (Non-Medical): Not on file  Physical Activity:   . Days of Exercise per Week: Not on file  . Minutes of Exercise per Session: Not on file  Stress:   . Feeling of Stress : Not on file  Social  Connections:   . Frequency of Communication with Friends and Family: Not on file  . Frequency of Social Gatherings with Friends and Family: Not on file  . Attends Religious Services: Not on file  . Active Member of Clubs or Organizations: Not on file  . Attends Banker Meetings: Not on file  . Marital Status: Not on file    Family History  Problem Relation Age of Onset  . Hypertension Mother   . Hypertension Father   . Diabetes Father     Outpatient Encounter Medications as of 10/16/2019  Medication Sig  . Cholecalciferol (VITAMIN D3) 125 MCG (5000 UT) CAPS Take 1 capsule (5,000 Units total) by mouth daily.  Marland Kitchen gabapentin (NEURONTIN) 300 MG capsule Take 1 capsule by mouth at bedtime  . hydrochlorothiazide (HYDRODIURIL) 25 MG tablet Take 1 tablet (25 mg total) by mouth daily.  . insulin glargine (LANTUS SOLOSTAR) 100 UNIT/ML Solostar Pen Inject 50 Units into the skin at bedtime.  Marland Kitchen losartan (COZAAR) 100 MG tablet Take 1 tablet (100  mg total) by mouth daily.  . Multiple Vitamin (MULTIVITAMIN) tablet Take 1 tablet by mouth daily.  Marland Kitchen nystatin (MYCOSTATIN/NYSTOP) powder Apply topically as needed (for rash/irritation).  . pravastatin (PRAVACHOL) 20 MG tablet Take 1 tablet by mouth once daily  . terbinafine (LAMISIL) 250 MG tablet Take 1 tablet (250 mg total) by mouth daily.  . TRULICITY 1.5 MG/0.5ML SOPN INJECT 0.5 MLS (1.5MG  TOTAL) INTO THE SKIN ONCE A WEEK   No facility-administered encounter medications on file as of 10/16/2019.    ALLERGIES: Allergies  Allergen Reactions  . Penicillins Swelling    Has patient had a PCN reaction causing immediate rash, facial/tongue/throat swelling, SOB or lightheadedness with hypotension: Yes Has patient had a PCN reaction causing severe rash involving mucus membranes or skin necrosis: No Has patient had a PCN reaction that required hospitalization: No Has patient had a PCN reaction occurring within the last 10 years: Yes If all of the  above answers are "NO", then may proceed with Cephalosporin use. Leg swelling/redness  . Ace Inhibitors Cough  . Metformin And Related Nausea Only  . Tramadol Rash    Scant rash on hands  After starting tramadol for knee pain in Jan 16, 2013    VACCINATION STATUS: Immunization History  Administered Date(s) Administered  . Influenza Split 10/27/2013  . Influenza Whole 12/29/2005, 09/29/2009  . Influenza,inj,Quad PF,6+ Mos 10/17/2012, 10/24/2016, 10/01/2018, 10/08/2019  . Moderna SARS-COVID-2 Vaccination 01/28/2019, 02/25/2019  . Pneumococcal Polysaccharide-23 01/12/2006, 07/09/2015  . Tdap 08/12/2010  . Zoster Recombinat (Shingrix) 07/23/2019, 10/08/2019    Diabetes She presents for her follow-up diabetic visit. She has type 2 diabetes mellitus. Onset time: She was diagnosed at approximate age of 17 years, she did have an episode of gestational diabetes. Her disease course has been worsening. There are no hypoglycemic associated symptoms. Pertinent negatives for hypoglycemia include no confusion, headaches, pallor or seizures. Pertinent negatives for diabetes include no chest pain, no fatigue, no polydipsia, no polyphagia and no polyuria. There are no hypoglycemic complications. Symptoms are worsening. Diabetic complications include peripheral neuropathy. Risk factors for coronary artery disease include diabetes mellitus, dyslipidemia, family history, hypertension, obesity and sedentary lifestyle. Current diabetic treatment includes insulin injections and oral agent (monotherapy). Her weight is decreasing steadily. She is following a generally unhealthy diet. When asked about meal planning, she reported none. She has not had a previous visit with a dietitian. She rarely participates in exercise. Her home blood glucose trend is fluctuating minimally. Her breakfast blood glucose range is generally 140-180 mg/dl. Her bedtime blood glucose range is generally 140-180 mg/dl. Her overall blood glucose  range is 140-180 mg/dl. (She presents with a meter showing average blood glucose of 168-179 over the last 3 days.  Her point-of-care A1c 6.5%, overall improving from 10.5%.  No documented or reported hypoglycemia.) An ACE inhibitor/angiotensin II receptor blocker is being taken. Eye exam is current.  Hyperlipidemia This is a chronic problem. The current episode started more than 1 year ago. The problem is controlled. Exacerbating diseases include diabetes and obesity. Pertinent negatives include no chest pain, myalgias or shortness of breath. Current antihyperlipidemic treatment includes statins. Risk factors for coronary artery disease include dyslipidemia, family history, obesity, hypertension, a sedentary lifestyle and diabetes mellitus.  Hypertension This is a chronic problem. The current episode started more than 1 year ago. Pertinent negatives include no chest pain, headaches, palpitations or shortness of breath. Risk factors for coronary artery disease include diabetes mellitus, dyslipidemia, obesity and sedentary lifestyle. Past treatments include angiotensin blockers.  Review of systems: Limited as above.  Objective:    BP (!) 142/86   Pulse 80   Ht 5\' 3"  (1.6 m)   Wt (!) 336 lb 9.6 oz (152.7 kg)   BMI 59.63 kg/m   Wt Readings from Last 3 Encounters:  10/16/19 (!) 336 lb 9.6 oz (152.7 kg)  10/08/19 (!) 339 lb (153.8 kg)  08/14/19 (!) 352 lb 12.8 oz (160 kg)     Physical Exam- Limited  Constitutional:  Body mass index is 59.63 kg/m. , not in acute distress, normal state of mind Eyes:  EOMI, no exophthalmos Neck: Supple Thyroid: No gross goiter Respiratory: Adequate breathing efforts Musculoskeletal: no gross deformities, strength intact in all four extremities, no gross restriction of joint movements,+ bilateral lower extremity edema. Skin:  no rashes, no hyperemia Neurological: no tremor with outstretched hands,    CMP ( most recent) CMP     Component Value Date/Time    NA 140 06/19/2019 0721   K 4.1 06/19/2019 0721   CL 104 06/19/2019 0721   CO2 27 06/19/2019 0721   GLUCOSE 138 (H) 06/19/2019 0721   BUN 18 06/19/2019 0721   CREATININE 0.87 06/19/2019 0721   CALCIUM 9.8 06/19/2019 0721   PROT 7.3 06/19/2019 0721   ALBUMIN 3.9 03/16/2016 0808   AST 21 06/19/2019 0721   ALT 20 06/19/2019 0721   ALKPHOS 52 03/16/2016 0808   BILITOT 0.6 06/19/2019 0721   GFRNONAA 101 02/14/2019 0848   GFRAA 117 02/14/2019 0848     Diabetic Labs (most recent): Lab Results  Component Value Date   HGBA1C 6.5 (A) 10/16/2019   HGBA1C 5.6 06/19/2019   HGBA1C 6.6 (H) 02/14/2019     Lipid Panel ( most recent) Lipid Panel     Component Value Date/Time   CHOL 192 07/23/2019 1010   TRIG 80 07/23/2019 1010   HDL 86 07/23/2019 1010   CHOLHDL 2.2 07/23/2019 1010   VLDL 22 03/16/2016 0808   LDLCALC 89 07/23/2019 1010      Lab Results  Component Value Date   TSH 0.98 06/19/2019   TSH 0.88 02/14/2019   TSH 0.96 01/27/2017   TSH 1.18 11/05/2015   TSH 1.603 07/10/2014   TSH 1.152 10/14/2012   TSH 1.202 10/14/2012   TSH 0.437 04/20/2011   TSH 1.274 04/06/2009   TSH 1.394 07/11/2007   FREET4 1.2 06/19/2019   FREET4 1.2 02/14/2019     Assessment & Plan:   1. Type 2 diabetes mellitus with diabetic nephropathy, with long-term current use of insulin (HCC)   - Isabella AverLinda D Matthews has currently uncontrolled symptomatic type 2 DM since  49 years of age.  She presents with a meter showing average blood glucose of 168-179 over the last 3 days.  Her point-of-care A1c 6.5%, overall improving from 10.5%.  No documented or reported hypoglycemia. -Her recent labs were reviewed with her. - I had a long discussion with her about the progressive nature of diabetes and the pathology behind its complications. -her diabetes is complicated by neuropathy, obesity/sedentary life and she remains at a high risk for more acute and chronic complications which include CAD, CVA, CKD,  retinopathy, and neuropathy. These are all discussed in detail with her.  - I have counseled her on diet  and weight management  by adopting a carbohydrate restricted/protein rich diet. Patient is encouraged to switch to  unprocessed or minimally processed     complex starch and increased protein intake (animal or plant source), fruits,  and vegetables. -  she is advised to stick to a routine mealtimes to eat 3 meals  a day and avoid unnecessary snacks ( to snack only to correct hypoglycemia).   - she  admits there is a room for improvement in her diet and drink choices. -  Suggestion is made for her to avoid simple carbohydrates  from her diet including Cakes, Sweet Desserts / Pastries, Ice Cream, Soda (diet and regular), Sweet Tea, Candies, Chips, Cookies, Sweet Pastries,  Store Bought Juices, Alcohol in Excess of  1-2 drinks a day, Artificial Sweeteners, Coffee Creamer, and "Sugar-free" Products. This will help patient to have stable blood glucose profile and potentially avoid unintended weight gain.   - she will be scheduled with Norm Salt, RDN, CDE for diabetes education.  - I have approached her with the following individualized plan to manage  her diabetes and patient agrees:   -Given her current glycemic response, she will not need prandial insulin for now. -She will benefit from de-escalation in her insulin to avoid inadvertent hypoglycemia.   -She is advised to lower her Lantus to 50 units nightly (she has been using 60 units nightly on her decision),   associated with monitoring of blood glucose 2 times a day-before BKF and at bedtime.  - she is warned not to take insulin without proper monitoring per orders. - she is encouraged to call clinic for blood glucose levels less than 70 or above 300 mg /dl.  -She is advised to continue Trulicity 1.5 mg subcutaneously weekly.  -She reports GI intolerance from metformin.  - Specific targets for  A1c;  LDL, HDL, Triglycerides, and  Waist  Circumference were discussed with the patient.  2) Blood Pressure /Hypertension:  Her blood pressure is not controlled to target.  She did not take her medications this morning.   she is advised to continue her current medications including hydrochlorothiazide 25 mg p.o. daily, losartan 100 mg p.o. daily.   3) Lipids/Hyperlipidemia:   Review of her recent lipid panel showed uncontrolled  LDL at 90 .  she  is advised to continue atorvastatin 20 mg p.o. daily at bedtime.    Side effects and precautions discussed with her.  4)  Weight/Diet: Her BMI is 59.6-   clearly complicating her diabetes care.   she is  a candidate for weight loss. I discussed with her the fact that loss of 5 - 10% of her  current body weight will have the most impact on her diabetes management.  Exercise, and detailed carbohydrates information provided  -  detailed on discharge instructions. -She may benefit from bariatric surgery, will be discussed in more detail on subsequent visit.  5) vitamin D deficiency She is on ongoing supplement with vitamin D3 5000 units daily.  She is advised to continue.  6) Chronic Care/Health Maintenance:  -she  is on ACEI/ARB and Statin medications and  is encouraged to initiate and continue to follow up with Ophthalmology, Dentist,  Podiatrist at least yearly or according to recommendations, and advised to   stay away from smoking. I have recommended yearly flu vaccine and pneumonia vaccine at least every 5 years; moderate intensity exercise for up to 150 minutes weekly; and  sleep for at least 7 hours a day.  - she is  advised to maintain close follow up with Kerri Perches, MD for primary care needs, as well as her other providers for optimal and coordinated care.  - Time spent on this patient care encounter:  35 min, of which > 50% was spent in  counseling and the rest reviewing her blood glucose logs , discussing her hypoglycemia and hyperglycemia episodes, reviewing her current and   previous labs / studies  ( including abstraction from other facilities) and medications  doses and developing a  long term treatment plan and documenting her care.   Please refer to Patient Instructions for Blood Glucose Monitoring and Insulin/Medications Dosing Guide"  in media tab for additional information. Please  also refer to " Patient Self Inventory" in the Media  tab for reviewed elements of pertinent patient history.  Isabella Matthews participated in the discussions, expressed understanding, and voiced agreement with the above plans.  All questions were answered to her satisfaction. she is encouraged to contact clinic should she have any questions or concerns prior to her return visit.  Follow up plan: - Return in about 4 months (around 02/15/2020) for Bring Meter and Logs- A1c in Office.  Marquis Lunch, MD [facility] [address] [place], [place] [address] Phone: [phone]  Fax: [phone]    10/16/2019, 8:54 AM  This note was partially dictated with voice recognition software. Similar sounding words can be transcribed inadequately or may not  be corrected upon review.

## 2019-11-14 ENCOUNTER — Other Ambulatory Visit: Payer: Self-pay | Admitting: Family Medicine

## 2019-12-12 ENCOUNTER — Other Ambulatory Visit: Payer: Self-pay | Admitting: "Endocrinology

## 2019-12-12 DIAGNOSIS — E1121 Type 2 diabetes mellitus with diabetic nephropathy: Secondary | ICD-10-CM

## 2019-12-25 ENCOUNTER — Other Ambulatory Visit: Payer: Self-pay | Admitting: Family Medicine

## 2020-01-06 ENCOUNTER — Other Ambulatory Visit: Payer: Self-pay | Admitting: Family Medicine

## 2020-01-20 LAB — HM DIABETES EYE EXAM

## 2020-02-03 LAB — CMP14+EGFR
ALT: 27 IU/L (ref 0–32)
AST: 29 IU/L (ref 0–40)
Albumin/Globulin Ratio: 1.4 (ref 1.2–2.2)
Albumin: 4.3 g/dL (ref 3.8–4.9)
Alkaline Phosphatase: 87 IU/L (ref 44–121)
BUN/Creatinine Ratio: 28 — ABNORMAL HIGH (ref 9–23)
BUN: 26 mg/dL — ABNORMAL HIGH (ref 6–24)
Bilirubin Total: 0.4 mg/dL (ref 0.0–1.2)
CO2: 24 mmol/L (ref 20–29)
Calcium: 9.7 mg/dL (ref 8.7–10.2)
Chloride: 105 mmol/L (ref 96–106)
Creatinine, Ser: 0.94 mg/dL (ref 0.57–1.00)
GFR calc Af Amer: 81 mL/min/{1.73_m2} (ref >59)
GFR calc non Af Amer: 70 mL/min/{1.73_m2} (ref >59)
Globulin, Total: 3.1 g/dL (ref 1.5–4.5)
Glucose: 94 mg/dL (ref 65–99)
Potassium: 4.3 mmol/L (ref 3.5–5.2)
Sodium: 145 mmol/L — ABNORMAL HIGH (ref 134–144)
Total Protein: 7.4 g/dL (ref 6.0–8.5)

## 2020-02-03 LAB — LIPID PANEL
Chol/HDL Ratio: 2.3 ratio (ref 0.0–4.4)
Cholesterol, Total: 194 mg/dL (ref 100–199)
HDL: 86 mg/dL (ref >39)
LDL Chol Calc (NIH): 93 mg/dL (ref 0–99)
Triglycerides: 83 mg/dL (ref 0–149)
VLDL Cholesterol Cal: 15 mg/dL (ref 5–40)

## 2020-02-09 ENCOUNTER — Other Ambulatory Visit: Payer: Self-pay

## 2020-02-09 ENCOUNTER — Telehealth (INDEPENDENT_AMBULATORY_CARE_PROVIDER_SITE_OTHER): Payer: No Typology Code available for payment source | Admitting: Family Medicine

## 2020-02-09 ENCOUNTER — Encounter: Payer: Self-pay | Admitting: Family Medicine

## 2020-02-09 VITALS — BP 125/72 | Temp 96.4°F | Wt 321.0 lb

## 2020-02-09 DIAGNOSIS — Z1231 Encounter for screening mammogram for malignant neoplasm of breast: Secondary | ICD-10-CM

## 2020-02-09 DIAGNOSIS — Z6841 Body Mass Index (BMI) 40.0 and over, adult: Secondary | ICD-10-CM

## 2020-02-09 DIAGNOSIS — E1169 Type 2 diabetes mellitus with other specified complication: Secondary | ICD-10-CM

## 2020-02-09 DIAGNOSIS — E1121 Type 2 diabetes mellitus with diabetic nephropathy: Secondary | ICD-10-CM | POA: Diagnosis not present

## 2020-02-09 DIAGNOSIS — I152 Hypertension secondary to endocrine disorders: Secondary | ICD-10-CM

## 2020-02-09 DIAGNOSIS — Z794 Long term (current) use of insulin: Secondary | ICD-10-CM

## 2020-02-09 DIAGNOSIS — E1159 Type 2 diabetes mellitus with other circulatory complications: Secondary | ICD-10-CM | POA: Diagnosis not present

## 2020-02-09 DIAGNOSIS — E785 Hyperlipidemia, unspecified: Secondary | ICD-10-CM

## 2020-02-09 DIAGNOSIS — I10 Essential (primary) hypertension: Secondary | ICD-10-CM

## 2020-02-09 DIAGNOSIS — E559 Vitamin D deficiency, unspecified: Secondary | ICD-10-CM

## 2020-02-09 NOTE — Assessment & Plan Note (Signed)
managed by Endo

## 2020-02-09 NOTE — Assessment & Plan Note (Signed)
Isabella Matthews is reminded of the importance of commitment to daily physical activity for 30 minutes or more, as able and the need to limit carbohydrate intake to 30 to 60 grams per meal to help with blood sugar control.   The need to take medication as prescribed, test blood sugar as directed, and to call between visits if there is a concern that blood sugar is uncontrolled is also discussed.   Isabella Matthews is reminded of the importance of daily foot exam, annual eye examination, and good blood sugar, blood pressure and cholesterol control. Controlled, no change in medication Managed by Endo  Diabetic Labs Latest Ref Rng & Units 02/02/2020 10/16/2019 07/23/2019 06/19/2019 02/14/2019  HbA1c 4.0 - 5.6 % - 6.5(A) - 5.6 6.6(H)  Microalbumin Not Estab. ug/mL - - 3.0(H) - -  Micro/Creat Ratio <30 mcg/mg creat - - - - -  Chol 100 - 199 mg/dL 500 - 938 - -  HDL >18 mg/dL 86 - 86 - -  Calc LDL 0 - 99 mg/dL 93 - 89 - -  Triglycerides 0 - 149 mg/dL 83 - 80 - -  Creatinine 0.57 - 1.00 mg/dL 2.99 - - 3.71 6.96   BP/Weight 02/09/2020 10/16/2019 10/08/2019 08/14/2019 07/23/2019 12/10/2018 10/28/2018  Systolic BP 125 142 138 136 158 120 134  Diastolic BP 72 86 82 82 96 87 80  Wt. (Lbs) 321 336.6 339 352.8 352 329 329  BMI 56.86 59.63 60.05 62.5 62.35 58.28 58.28   Foot/eye exam completion dates Latest Ref Rng & Units 01/20/2020 10/08/2019  Eye Exam No Retinopathy No Retinopathy -  Foot exam Order - - -  Foot Form Completion - - Done

## 2020-02-09 NOTE — Patient Instructions (Addendum)
Annual physical exam in office with MD end July, call if you need me sooner, no pap  Please [facility] for after 3 pm March 9 or after  Excellent labs, no med changes  Keep up great health habits , they are improving your health  Fasting lipid, cmp and EGFr, microalb and CBC 1 week before July appointment  It is important that you exercise regularly at least 30 minutes 5 times a week. If you develop chest pain, have severe difficulty breathing, or feel very tired, stop exercising immediately and seek medical attention  Think about what you will eat, plan ahead. Choose " clean, green, fresh or frozen" over canned, processed or packaged foods which are more sugary, salty and fatty. 70 to 75% of food eaten should be vegetables and fruit. Three meals at set times with snacks allowed between meals, but they must be fruit or vegetables. Aim to eat over a 12 hour period , example 7 am to 7 pm, and STOP after  your last meal of the day. Drink water,generally about 64 ounces per day, no other drink is as healthy. Fruit juice is best enjoyed in a healthy way, by EATING the fruit. Thanks for choosing [facility], we consider it a privelige to serve you.

## 2020-02-09 NOTE — Assessment & Plan Note (Signed)
Controlled, no change in medication DASH diet and commitment to daily physical activity for a minimum of 30 minutes discussed and encouraged, as a part of hypertension management. The importance of attaining a healthy weight is also discussed.  BP/Weight 02/09/2020 10/16/2019 10/08/2019 08/14/2019 07/23/2019 12/10/2018 10/28/2018  Systolic BP 125 142 138 136 158 120 134  Diastolic BP 72 86 82 82 96 87 80  Wt. (Lbs) 321 336.6 339 352.8 352 329 329  BMI 56.86 59.63 60.05 62.5 62.35 58.28 58.28

## 2020-02-09 NOTE — Progress Notes (Signed)
Virtual Visit via Telephone Note  I connected with Isabella Matthews on 02/09/20 at  8:00 AM EST by telephone and verified that I am speaking with the correct person using two identifiers.  Location: Patient: home Provider: work   I discussed the limitations, risks, security and privacy concerns of performing an evaluation and management service by telephone and the availability of in person appointments. I also discussed with the patient that there may be a patient responsible charge related to this service. The patient expressed understanding and agreed to proceed.   History of Present Illness: F/U chronic problems, doing well overall. Denies polyuria, polydipsia, blurred vision , or hypoglycemic episodes. Denies recent fever or chills. Denies sinus pressure, nasal congestion, ear pain or sore throat. Denies chest congestion, productive cough or wheezing. Denies chest pains, palpitations and leg swelling Denies abdominal pain, nausea, vomiting,diarrhea or constipation.   Denies dysuria, frequency, hesitancy or incontinence. Chronic bilateral knee pain, no falls or near falls. Denies headaches, seizures, numbness, or tingling. Denies depression, anxiety or insomnia. Denies skin break down or rash.        Observations/Objective: BP 125/72    Temp (!) 96.4 F (35.8 C)    Wt (!) 321 lb (145.6 kg)    SpO2 97%    BMI 56.86 kg/m  Good communication with no confusion and intact memory. Alert and oriented x 3 No signs of respiratory distress during speech    Assessment and Plan: Hypertension associated with type 2 diabetes mellitus (HCC) Controlled, no change in medication DASH diet and commitment to daily physical activity for a minimum of 30 minutes discussed and encouraged, as a part of hypertension management. The importance of attaining a healthy weight is also discussed.  BP/Weight 02/09/2020 10/16/2019 10/08/2019 08/14/2019 07/23/2019 12/10/2018 10/28/2018  Systolic BP 125  671 138 136 158 120 134  Diastolic BP 72 86 82 82 96 87 80  Wt. (Lbs) 321 336.6 339 352.8 352 329 329  BMI 56.86 59.63 60.05 62.5 62.35 58.28 58.28       Hyperlipidemia associated with type 2 diabetes mellitus (HCC) Hyperlipidemia:Low fat diet discussed and encouraged.   Lipid Panel  Lab Results  Component Value Date   CHOL 194 02/02/2020   HDL 86 02/02/2020   LDLCALC 93 02/02/2020   TRIG 83 02/02/2020   CHOLHDL 2.3 02/02/2020     Controlled, no change in medication   Morbid obesity with BMI of 50.0-59.9, adult [facility])  Patient re-educated about  the importance of commitment to a  minimum of 150 minutes of exercise per week as able.  The importance of healthy food choices with portion control discussed, as well as eating regularly and within a 12 hour window most days. The need to choose "clean , green" food 50 to 75% of the time is discussed, as well as to make water the primary drink and set a goal of 64 ounces water daily.    Weight /BMI 02/09/2020 10/16/2019 10/08/2019  WEIGHT 321 lb 336 lb 9.6 oz 339 lb  HEIGHT - 5\' 3"  5\' 3"   BMI 56.86 kg/m2 59.63 kg/m2 60.05 kg/m2      Type 2 diabetes mellitus with diabetic nephropathy, with long-term current use of insulin (HCC) Ms. Ziemba is reminded of the importance of commitment to daily physical activity for 30 minutes or more, as able and the need to limit carbohydrate intake to 30 to 60 grams per meal to help with blood sugar control.   The need to take medication as  prescribed, test blood sugar as directed, and to call between visits if there is a concern that blood sugar is uncontrolled is also discussed.   Ms. Moline is reminded of the importance of daily foot exam, annual eye examination, and good blood sugar, blood pressure and cholesterol control. Controlled, no change in medication Managed by Endo  Diabetic Labs Latest Ref Rng & Units 02/02/2020 10/16/2019 07/23/2019 06/19/2019 02/14/2019  HbA1c 4.0 - 5.6 % -  6.5(A) - 5.6 6.6(H)  Microalbumin Not Estab. ug/mL - - 3.0(H) - -  Micro/Creat Ratio <30 mcg/mg creat - - - - -  Chol 100 - 199 mg/dL 882 - 800 - -  HDL >34 mg/dL 86 - 86 - -  Calc LDL 0 - 99 mg/dL 93 - 89 - -  Triglycerides 0 - 149 mg/dL 83 - 80 - -  Creatinine 0.57 - 1.00 mg/dL 9.17 - - 9.15 0.56   BP/Weight 02/09/2020 10/16/2019 10/08/2019 08/14/2019 07/23/2019 12/10/2018 10/28/2018  Systolic BP 125 142 138 136 158 120 134  Diastolic BP 72 86 82 82 96 87 80  Wt. (Lbs) 321 336.6 339 352.8 352 329 329  BMI 56.86 59.63 60.05 62.5 62.35 58.28 58.28   Foot/eye exam completion dates Latest Ref Rng & Units 01/20/2020 10/08/2019  Eye Exam No Retinopathy No Retinopathy -  Foot exam Order - - -  Foot Form Completion - - Done        Vitamin D deficiency managed by Endo    Follow Up Instructions:    I discussed the assessment and treatment plan with the patient. The patient was provided an opportunity to ask questions and all were answered. The patient agreed with the plan and demonstrated an understanding of the instructions.   The patient was advised to call back or seek an in-person evaluation if the symptoms worsen or if the condition fails to improve as anticipated.  I provided 25 minutes of non-face-to-face time during this encounter.   Syliva Overman, MD

## 2020-02-09 NOTE — Assessment & Plan Note (Signed)
  Patient re-educated about  the importance of commitment to a  minimum of 150 minutes of exercise per week as able.  The importance of healthy food choices with portion control discussed, as well as eating regularly and within a 12 hour window most days. The need to choose "clean , green" food 50 to 75% of the time is discussed, as well as to make water the primary drink and set a goal of 64 ounces water daily.    Weight /BMI 02/09/2020 10/16/2019 10/08/2019  WEIGHT 321 lb 336 lb 9.6 oz 339 lb  HEIGHT - 5\' 3"  5\' 3"   BMI 56.86 kg/m2 59.63 kg/m2 60.05 kg/m2

## 2020-02-09 NOTE — Assessment & Plan Note (Signed)
Hyperlipidemia:Low fat diet discussed and encouraged.   Lipid Panel  Lab Results  Component Value Date   CHOL 194 02/02/2020   HDL 86 02/02/2020   LDLCALC 93 02/02/2020   TRIG 83 02/02/2020   CHOLHDL 2.3 02/02/2020     Controlled, no change in medication

## 2020-02-10 NOTE — Addendum Note (Signed)
Addended by: Abner Greenspan on: 02/10/2020 09:39 AM   Modules accepted: Orders

## 2020-02-11 ENCOUNTER — Other Ambulatory Visit: Payer: Self-pay | Admitting: Family Medicine

## 2020-02-11 ENCOUNTER — Other Ambulatory Visit: Payer: Self-pay | Admitting: "Endocrinology

## 2020-02-11 DIAGNOSIS — Z794 Long term (current) use of insulin: Secondary | ICD-10-CM

## 2020-02-11 DIAGNOSIS — E1121 Type 2 diabetes mellitus with diabetic nephropathy: Secondary | ICD-10-CM

## 2020-02-17 ENCOUNTER — Other Ambulatory Visit: Payer: Self-pay | Admitting: "Endocrinology

## 2020-02-17 ENCOUNTER — Ambulatory Visit (INDEPENDENT_AMBULATORY_CARE_PROVIDER_SITE_OTHER): Payer: No Typology Code available for payment source | Admitting: "Endocrinology

## 2020-02-17 ENCOUNTER — Encounter: Payer: Self-pay | Admitting: "Endocrinology

## 2020-02-17 ENCOUNTER — Telehealth: Payer: Self-pay

## 2020-02-17 ENCOUNTER — Other Ambulatory Visit: Payer: Self-pay

## 2020-02-17 VITALS — BP 142/88 | HR 84 | Ht 63.0 in | Wt 340.4 lb

## 2020-02-17 DIAGNOSIS — E1121 Type 2 diabetes mellitus with diabetic nephropathy: Secondary | ICD-10-CM | POA: Diagnosis not present

## 2020-02-17 DIAGNOSIS — I10 Essential (primary) hypertension: Secondary | ICD-10-CM | POA: Diagnosis not present

## 2020-02-17 DIAGNOSIS — E559 Vitamin D deficiency, unspecified: Secondary | ICD-10-CM

## 2020-02-17 DIAGNOSIS — Z794 Long term (current) use of insulin: Secondary | ICD-10-CM

## 2020-02-17 DIAGNOSIS — E782 Mixed hyperlipidemia: Secondary | ICD-10-CM | POA: Diagnosis not present

## 2020-02-17 LAB — POCT GLYCOSYLATED HEMOGLOBIN (HGB A1C): HbA1c, POC (controlled diabetic range): 6.4 % (ref 0.0–7.0)

## 2020-02-17 MED ORDER — TRULICITY 1.5 MG/0.5ML ~~LOC~~ SOAJ
SUBCUTANEOUS | 1 refills | Status: DC
Start: 2020-02-17 — End: 2020-02-24

## 2020-02-17 MED ORDER — TRESIBA FLEXTOUCH 200 UNIT/ML ~~LOC~~ SOPN: 50.0000 [IU] | PEN_INJECTOR | Freq: Every day | SUBCUTANEOUS | 1 refills | Status: DC

## 2020-02-17 MED ORDER — LANTUS SOLOSTAR 100 UNIT/ML ~~LOC~~ SOPN: PEN_INJECTOR | SUBCUTANEOUS | 1 refills | Status: DC

## 2020-02-17 NOTE — Telephone Encounter (Signed)
Yes I switched her Lantus to [place].

## 2020-02-17 NOTE — Progress Notes (Signed)
02/17/2020, 10:19 AM                                 Endocrinology follow-up note   Subjective:    Patient ID: Isabella Matthews, female    DOB: May 03, 1970.  Isabella Matthews is being seen in follow-up for  management of currently uncontrolled symptomatic diabetes requested by  Kerri Perches, MD.   Past Medical History:  Diagnosis Date  . Aortic stenosis, mild   . Aortic valve disorders   . Diabetes mellitus   . Diabetes mellitus without complication (HCC)    Phreesia 02/08/2020  . Edema   . Essential hypertension   . Hypertension    Phreesia 02/08/2020  . Morbid obesity (HCC)   . Onychomycosis of toenail     Past Surgical History:  Procedure Laterality Date  . CESAREAN SECTION     x2    Social History   Socioeconomic History  . Marital status: Single    Spouse name: Not on file  . Number of children: Not on file  . Years of education: Not on file  . Highest education level: Not on file  Occupational History  . Occupation: CNA - [employer]   Tobacco Use  . Smoking status: Former Smoker    Packs/day: 0.50    Types: Cigarettes    Quit date: 11/23/2014    Years since quitting: 5.2  . Smokeless tobacco: Never Used  Vaping Use  . Vaping Use: Never used  Substance and Sexual Activity  . Alcohol use: Yes    Alcohol/week: 0.0 standard drinks    Comment: occasionally  . Drug use: No  . Sexual activity: Not on file  Other Topics Concern  . Not on file  Social History Narrative  . Not on file   Social Determinants of Health   Financial Resource Strain: Not on file  Food Insecurity: Not on file  Transportation Needs: Not on file  Physical Activity: Not on file  Stress: Not on file  Social Connections: Not on file    Family History  Problem Relation Age of Onset  . Hypertension Mother   . Hypertension Father   . Diabetes Father     Outpatient Encounter Medications as of 02/17/2020  Medication Sig  . Cholecalciferol  (VITAMIN D3) 125 MCG (5000 UT) CAPS Take 1 capsule (5,000 Units total) by mouth daily.  . Dulaglutide (TRULICITY) 1.5 MG/0.5ML SOPN INJECT 0.5 MLS INTO THE SKIN ONCE A WEEK  . gabapentin (NEURONTIN) 300 MG capsule Take 1 capsule by mouth at bedtime  . hydrochlorothiazide (HYDRODIURIL) 25 MG tablet Take 1 tablet (25 mg total) by mouth daily.  . insulin glargine (LANTUS SOLOSTAR) 100 UNIT/ML Solostar Pen INJECT 50 UNITS SUBCUTANEOUSLY AT BEDTIME  . losartan (COZAAR) 100 MG tablet Take 1 tablet (100 mg total) by mouth daily.  . Multiple Vitamin (MULTIVITAMIN) tablet Take 1 tablet by mouth daily.  Marland Kitchen nystatin (MYCOSTATIN/NYSTOP) powder Apply topically as needed (for rash/irritation).  . pravastatin (PRAVACHOL) 20 MG tablet Take 1 tablet by mouth once daily  . [DISCONTINUED] LANTUS SOLOSTAR 100 UNIT/ML Solostar Pen INJECT 50 UNITS SUBCUTANEOUSLY AT BEDTIME  . [DISCONTINUED] TRULICITY 1.5 MG/0.5ML SOPN INJECT 0.5 MLS INTO THE SKIN ONCE A WEEK   No facility-administered encounter medications on file as of 02/17/2020.    ALLERGIES: Allergies  Allergen Reactions  . Penicillins Swelling    Has  patient had a PCN reaction causing immediate rash, facial/tongue/throat swelling, SOB or lightheadedness with hypotension: Yes Has patient had a PCN reaction causing severe rash involving mucus membranes or skin necrosis: No Has patient had a PCN reaction that required hospitalization: No Has patient had a PCN reaction occurring within the last 10 years: Yes If all of the above answers are "NO", then may proceed with Cephalosporin use. Leg swelling/redness  . Ace Inhibitors Cough  . Metformin And Related Nausea Only  . Tramadol Rash    Scant rash on hands  After starting tramadol for knee pain in Jan 16, 2013    VACCINATION STATUS: Immunization History  Administered Date(s) Administered  . Influenza Split 10/27/2013  . Influenza Whole 12/29/2005, 09/29/2009  . Influenza,inj,Quad PF,6+ Mos 10/17/2012,  10/24/2016, 10/01/2018, 10/08/2019  . Moderna Sars-Covid-2 Vaccination 01/28/2019, 02/25/2019, 12/16/2019  . Pneumococcal Polysaccharide-23 01/12/2006, 07/09/2015  . Tdap 08/12/2010  . Zoster Recombinat (Shingrix) 07/23/2019, 10/08/2019    Diabetes She presents for her follow-up diabetic visit. She has type 2 diabetes mellitus. Onset time: She was diagnosed at approximate age of 34 years, she did have an episode of gestational diabetes. Her disease course has been stable. There are no hypoglycemic associated symptoms. Pertinent negatives for hypoglycemia include no confusion, headaches, pallor or seizures. Pertinent negatives for diabetes include no chest pain, no fatigue, no polydipsia, no polyphagia and no polyuria. There are no hypoglycemic complications. Symptoms are stable. Diabetic complications include peripheral neuropathy. Risk factors for coronary artery disease include diabetes mellitus, dyslipidemia, family history, hypertension, obesity and sedentary lifestyle. Current diabetic treatment includes insulin injections and oral agent (monotherapy). She is compliant with treatment most of the time. Her weight is decreasing steadily. She is following a generally unhealthy diet. When asked about meal planning, she reported none. She has not had a previous visit with a dietitian. She rarely participates in exercise. Her home blood glucose trend is decreasing steadily. Her breakfast blood glucose range is generally 140-180 mg/dl. Her bedtime blood glucose range is generally 140-180 mg/dl. Her overall blood glucose range is 140-180 mg/dl. (She presents with near target glycemic profile averaging 163 over the last 90 days.  Her point-of-care A1c 6.4%, overall improving from 10.5%.  No documented or reported hypoglycemia.  ) An ACE inhibitor/angiotensin II receptor blocker is being taken. Eye exam is current.  Hyperlipidemia This is a chronic problem. The current episode started more than 1 year ago. The  problem is controlled. Exacerbating diseases include diabetes and obesity. Pertinent negatives include no chest pain, myalgias or shortness of breath. Current antihyperlipidemic treatment includes statins. Risk factors for coronary artery disease include dyslipidemia, family history, obesity, hypertension, a sedentary lifestyle and diabetes mellitus.  Hypertension This is a chronic problem. The current episode started more than 1 year ago. Pertinent negatives include no chest pain, headaches, palpitations or shortness of breath. Risk factors for coronary artery disease include diabetes mellitus, dyslipidemia, obesity and sedentary lifestyle. Past treatments include angiotensin blockers.    Review of systems: Limited as above.  Objective:    BP (!) 142/88   Pulse 84   Ht 5\' 3"  (1.6 m)   Wt (!) 340 lb 6.4 oz (154.4 kg)   BMI 60.30 kg/m   Wt Readings from Last 3 Encounters:  02/17/20 (!) 340 lb 6.4 oz (154.4 kg)  02/09/20 (!) 321 lb (145.6 kg)  10/16/19 (!) 336 lb 9.6 oz (152.7 kg)     Physical Exam- Limited  Constitutional:  Body mass index is 60.3 kg/m. ,  not in acute distress, normal state of mind    CMP ( most recent) CMP     Component Value Date/Time   NA 145 (H) 02/02/2020 1128   K 4.3 02/02/2020 1128   CL 105 02/02/2020 1128   CO2 24 02/02/2020 1128   GLUCOSE 94 02/02/2020 1128   GLUCOSE 138 (H) 06/19/2019 0721   BUN 26 (H) 02/02/2020 1128   CREATININE 0.94 02/02/2020 1128   CREATININE 0.87 06/19/2019 0721   CALCIUM 9.7 02/02/2020 1128   PROT 7.4 02/02/2020 1128   ALBUMIN 4.3 02/02/2020 1128   AST 29 02/02/2020 1128   ALT 27 02/02/2020 1128   ALKPHOS 87 02/02/2020 1128   BILITOT 0.4 02/02/2020 1128   GFRNONAA 70 02/02/2020 1128   GFRNONAA 101 02/14/2019 0848   GFRAA 81 02/02/2020 1128   GFRAA 117 02/14/2019 0848     Diabetic Labs (most recent): Lab Results  Component Value Date   HGBA1C 6.4 02/17/2020   HGBA1C 6.5 (A) 10/16/2019   HGBA1C 5.6 06/19/2019      Lipid Panel ( most recent) Lipid Panel     Component Value Date/Time   CHOL 194 02/02/2020 1128   TRIG 83 02/02/2020 1128   HDL 86 02/02/2020 1128   CHOLHDL 2.3 02/02/2020 1128   CHOLHDL 2.2 07/23/2019 1010   VLDL 22 03/16/2016 0808   LDLCALC 93 02/02/2020 1128   LDLCALC 89 07/23/2019 1010   LABVLDL 15 02/02/2020 1128      Lab Results  Component Value Date   TSH 0.98 06/19/2019   TSH 0.88 02/14/2019   TSH 0.96 01/27/2017   TSH 1.18 11/05/2015   TSH 1.603 07/10/2014   TSH 1.152 10/14/2012   TSH 1.202 10/14/2012   TSH 0.437 04/20/2011   TSH 1.274 04/06/2009   TSH 1.394 07/11/2007   FREET4 1.2 06/19/2019   FREET4 1.2 02/14/2019     Assessment & Plan:   1. Type 2 diabetes mellitus with diabetic nephropathy, with long-term current use of insulin (HCC)   - Isabella Matthews has currently uncontrolled symptomatic type 2 DM since  50 years of age.  She presents with near target glycemic profile averaging 163 over the last 90 days.  Her point-of-care A1c 6.4%, overall improving from 10.5%.  No documented or reported hypoglycemia.   -Her recent labs were reviewed with her. - I had a long discussion with her about the progressive nature of diabetes and the pathology behind its complications. -her diabetes is complicated by neuropathy, obesity/sedentary life and she remains at a high risk for more acute and chronic complications which include CAD, CVA, CKD, retinopathy, and neuropathy. These are all discussed in detail with her.  - I have counseled her on diet  and weight management  by adopting a carbohydrate restricted/protein rich diet. Patient is encouraged to switch to  unprocessed or minimally processed     complex starch and increased protein intake (animal or plant source), fruits, and vegetables. -  she is advised to stick to a routine mealtimes to eat 3 meals  a day and avoid unnecessary snacks ( to snack only to correct hypoglycemia).   - she acknowledges that  there is a room for improvement in her food and drink choices. - Suggestion is made for her to avoid simple carbohydrates  from her diet including Cakes, Sweet Desserts, Ice Cream, Soda (diet and regular), Sweet Tea, Candies, Chips, Cookies, Store Bought Juices, Alcohol in Excess of  1-2 drinks a day, Artificial Sweeteners,  Coffee  Creamer, and "Sugar-free" Products, Lemonade. This will help patient to have more stable blood glucose profile and potentially avoid unintended weight gain.   - she will be scheduled with Norm Salt, RDN, CDE for diabetes education.  - I have approached her with the following individualized plan to manage  her diabetes and patient agrees:   -Given her current glycemic response, she will not need prandial insulin for now.    -She continues to benefit from de-escalation in her insulin to avoid inadvertent hypoglycemia.   -She is advised to continue Lantus 50 units nightly,   associated with monitoring of blood glucose 2 times a day-before BKF and at bedtime.  - she is warned not to take insulin without proper monitoring per orders. - she is encouraged to call clinic for blood glucose levels less than 70 or above 300 mg /dl.  -She is advised to continue Trulicity 1.5 mg subcutaneously weekly.    -She reports GI intolerance from metformin.  - Specific targets for  A1c;  LDL, HDL, Triglycerides, and  Waist Circumference were discussed with the patient.  2) Blood Pressure /Hypertension:  Her blood pressure is controlled to near target.  She did not take her medications this morning.   she is advised to continue her current medications including hydrochlorothiazide 25 mg p.o. daily, losartan 100 mg p.o. daily.   3) Lipids/Hyperlipidemia:   Review of her recent lipid panel showed uncontrolled  LDL at 90 .  she  is advised to continue atorvastatin 20 mg p.o. daily at bedtime.   Side effects and precautions discussed with her.  4)  Weight/Diet: Her BMI is 60.3-    clearly complicating her diabetes care.   she is  a candidate for weight loss. I discussed with her the fact that loss of 5 - 10% of her  current body weight will have the most impact on her diabetes management.  Exercise, and detailed carbohydrates information provided  -  detailed on discharge instructions. -She may benefit from bariatric surgery, will be discussed in more detail on subsequent visit.  5) vitamin D deficiency She is on ongoing supplement with vitamin D3 5000 units daily.  She is advised to continue.  6) Chronic Care/Health Maintenance:  -she  is on ACEI/ARB and Statin medications and  is encouraged to initiate and continue to follow up with Ophthalmology, Dentist,  Podiatrist at least yearly or according to recommendations, and advised to   stay away from smoking. I have recommended yearly flu vaccine and pneumonia vaccine at least every 5 years; moderate intensity exercise for up to 150 minutes weekly; and  sleep for at least 7 hours a day.  - she is  advised to maintain close follow up with Kerri Perches, MD for primary care needs, as well as her other providers for optimal and coordinated care.  - Time spent on this patient care encounter:  35 min, of which > 50% was spent in  counseling and the rest reviewing her blood glucose logs , discussing her hypoglycemia and hyperglycemia episodes, reviewing her current and  previous labs / studies  ( including abstraction from other facilities) and medications  doses and developing a  long term treatment plan and documenting her care.   Please refer to Patient Instructions for Blood Glucose Monitoring and Insulin/Medications Dosing Guide"  in media tab for additional information. Please  also refer to " Patient Self Inventory" in the Media  tab for reviewed elements of pertinent patient history.  Isabella Matthews  D Wickizer participated in the discussions, expressed understanding, and voiced agreement with the above plans.  All questions were  answered to her satisfaction. she is encouraged to contact clinic should she have any questions or concerns prior to her return visit.   Follow up plan: - Return in about 4 months (around 06/16/2020) for Bring Meter and Logs- A1c in Office.  Marquis Lunch, MD [facility] [address] [place], [place] [address] Phone: [phone]  Fax: [phone]    02/17/2020, 10:19 AM  This note was partially dictated with voice recognition software. Similar sounding words can be transcribed inadequately or may not  be corrected upon review.

## 2020-02-17 NOTE — Patient Instructions (Signed)
                                     Advice for Weight Management  -For most of us the best way to lose weight is by diet management. Generally speaking, diet management means consuming less calories intentionally which over time brings about progressive weight loss.  This can be achieved more effectively by restricting carbohydrate consumption to the minimum possible.  So, it is critically important to know your numbers: how much calorie you are consuming and how much calorie you need. More importantly, our carbohydrates sources should be unprocessed or minimally processed complex starch food items.   Sometimes, it is important to balance nutrition by increasing protein intake (animal or plant source), fruits, and vegetables.  -Sticking to a routine mealtime to eat 3 meals a day and avoiding unnecessary snacks is shown to have a big role in weight control. Under normal circumstances, the only time we lose real weight is when we are hungry, so allow hunger to take place- hunger means no food between meal times, only water.  It is not advisable to starve.   -It is better to avoid simple carbohydrates including: Cakes, Sweet Desserts, Ice Cream, Soda (diet and regular), Sweet Tea, Candies, Chips, Cookies, Store Bought Juices, Alcohol in Excess of  1-2 drinks a day, Lemonade,  Artificial Sweeteners, Doughnuts, Coffee Creamers, "Sugar-free" Products, etc, etc.  This is not a complete list.....    -Consulting with certified diabetes educators is proven to provide you with the most accurate and current information on diet.  Also, you may be  interested in discussing diet options/exchanges , we can schedule a visit with Isabella Matthews, RDN, CDE for individualized nutrition education.  -Exercise: If you are able: 30 -60 minutes a day ,4 days a week, or 150 minutes a week.  The longer the better.  Combine stretch, strength, and aerobic activities.  If you were told in the  past that you have high risk for cardiovascular diseases, you may seek evaluation by your heart doctor prior to initiating moderate to intense exercise programs.                                  Additional Care Considerations for Diabetes   -Diabetes  is a chronic disease.  The most important care consideration is regular follow-up with your diabetes care provider with the goal being avoiding or delaying its complications and to take advantage of advances in medications and technology.    -Type 2 diabetes is known to coexist with other important comorbidities such as high blood pressure and high cholesterol.  It is critical to control not only the diabetes but also the high blood pressure and high cholesterol to minimize and delay the risk of complications including coronary artery disease, stroke, amputations, blindness, etc.    - Studies showed that people with diabetes will benefit from a class of medications known as ACE inhibitors and statins.  Unless there are specific reasons not to be on these medications, the standard of care is to consider getting one from these groups of medications at an optimal doses.  These medications are generally considered safe and proven to help protect the heart and the kidneys.    - People with diabetes are encouraged to initiate and maintain regular follow-up with eye doctors, foot   doctors, dentists , and if necessary heart and kidney doctors.     - It is highly recommended that people with diabetes quit smoking or stay away from smoking, and get yearly  flu vaccine and pneumonia vaccine at least every 5 years.  One other important lifestyle recommendation is to ensure adequate sleep - at least 6-7 hours of uninterrupted sleep at night.  -Exercise: If you are able: 30 -60 minutes a day, 4 days a week, or 150 minutes a week.  The longer the better.  Combine stretch, strength, and aerobic activities.  If you were told in the past that you have high risk for  cardiovascular diseases, you may seek evaluation by your heart doctor prior to initiating moderate to intense exercise programs.          

## 2020-02-17 NOTE — Telephone Encounter (Signed)
Please advise, it looks like Isabella Matthews was sent in

## 2020-02-17 NOTE — Telephone Encounter (Signed)
Patient's pharmacy left a voicemail requesting a 90 day supply for her Lantus as it is the same price as a 30 day supply. Please advise

## 2020-02-17 NOTE — Telephone Encounter (Signed)
Patient is not on Lantus it was switched to [place]

## 2020-02-23 ENCOUNTER — Telehealth: Payer: Self-pay | Admitting: "Endocrinology

## 2020-02-23 NOTE — Telephone Encounter (Signed)
Returned call to patient and advised the Lantus was d/c'd and tresiba was started, verbalized understanding

## 2020-02-23 NOTE — Telephone Encounter (Signed)
Pt states she has mail order and the pharmacist is needing to know if she is supposed to be taking Lantus and [place] or one or the other.

## 2020-02-24 ENCOUNTER — Other Ambulatory Visit: Payer: Self-pay

## 2020-02-24 DIAGNOSIS — Z794 Long term (current) use of insulin: Secondary | ICD-10-CM

## 2020-02-24 DIAGNOSIS — E1121 Type 2 diabetes mellitus with diabetic nephropathy: Secondary | ICD-10-CM

## 2020-02-24 MED ORDER — TRESIBA FLEXTOUCH 200 UNIT/ML ~~LOC~~ SOPN: 50.0000 [IU] | PEN_INJECTOR | Freq: Every day | SUBCUTANEOUS | 1 refills | Status: DC

## 2020-02-24 MED ORDER — TRULICITY 1.5 MG/0.5ML ~~LOC~~ SOAJ: SUBCUTANEOUS | 1 refills | Status: DC

## 2020-02-24 NOTE — Telephone Encounter (Signed)
Pt pharmacy called and said that the prescriptions were not sent in for 90 days and that is what is cheaper for patient. She is needing Trulicity and [place] sent in for 90 day supply

## 2020-02-24 NOTE — Telephone Encounter (Signed)
SENT IN

## 2020-03-29 ENCOUNTER — Other Ambulatory Visit: Payer: Self-pay | Admitting: Family Medicine

## 2020-03-31 ENCOUNTER — Other Ambulatory Visit: Payer: Self-pay

## 2020-03-31 ENCOUNTER — Ambulatory Visit (HOSPITAL_COMMUNITY)
Admission: RE | Admit: 2020-03-31 | Discharge: 2020-03-31 | Disposition: A | Discharge status: Home / Self Care | Payer: PRIVATE HEALTH INSURANCE | Source: Ambulatory Visit | Attending: Family Medicine | Admitting: Family Medicine

## 2020-03-31 DIAGNOSIS — Z1231 Encounter for screening mammogram for malignant neoplasm of breast: Secondary | ICD-10-CM | POA: Diagnosis present

## 2020-04-05 ENCOUNTER — Other Ambulatory Visit: Payer: Self-pay | Admitting: Family Medicine

## 2020-04-12 ENCOUNTER — Other Ambulatory Visit: Payer: Self-pay

## 2020-04-12 MED ORDER — HYDROCHLOROTHIAZIDE 25 MG PO TABS: 25.0000 mg | ORAL_TABLET | Freq: Every day | ORAL | 5 refills | Status: DC

## 2020-04-12 MED ORDER — GABAPENTIN 300 MG PO CAPS: 300.0000 mg | ORAL_CAPSULE | Freq: Every day | ORAL | 1 refills | Status: DC

## 2020-04-12 MED ORDER — PRAVASTATIN SODIUM 20 MG PO TABS: 20.0000 mg | ORAL_TABLET | Freq: Every day | ORAL | 1 refills | Status: DC

## 2020-04-12 MED ORDER — LOSARTAN POTASSIUM 100 MG PO TABS: 100.0000 mg | ORAL_TABLET | Freq: Every day | ORAL | 1 refills | Status: DC

## 2020-06-18 ENCOUNTER — Encounter: Payer: Self-pay | Admitting: "Endocrinology

## 2020-06-18 ENCOUNTER — Ambulatory Visit (INDEPENDENT_AMBULATORY_CARE_PROVIDER_SITE_OTHER): Payer: No Typology Code available for payment source | Admitting: "Endocrinology

## 2020-06-18 VITALS — BP 126/78 | HR 80 | Ht 63.0 in | Wt 334.0 lb

## 2020-06-18 DIAGNOSIS — E559 Vitamin D deficiency, unspecified: Secondary | ICD-10-CM | POA: Diagnosis not present

## 2020-06-18 DIAGNOSIS — Z794 Long term (current) use of insulin: Secondary | ICD-10-CM | POA: Diagnosis not present

## 2020-06-18 DIAGNOSIS — E1121 Type 2 diabetes mellitus with diabetic nephropathy: Secondary | ICD-10-CM | POA: Diagnosis not present

## 2020-06-18 DIAGNOSIS — I10 Essential (primary) hypertension: Secondary | ICD-10-CM | POA: Diagnosis not present

## 2020-06-18 DIAGNOSIS — E782 Mixed hyperlipidemia: Secondary | ICD-10-CM

## 2020-06-18 LAB — POCT GLYCOSYLATED HEMOGLOBIN (HGB A1C): HbA1c, POC (controlled diabetic range): 6.6 % (ref 0.0–7.0)

## 2020-06-18 MED ORDER — TRULICITY 3 MG/0.5ML ~~LOC~~ SOAJ: 3.0000 mg | SUBCUTANEOUS | 2 refills | Status: DC

## 2020-06-18 NOTE — Progress Notes (Signed)
06/18/2020, 9:12 AM                                 Endocrinology follow-up note   Subjective:    Patient ID: Isabella Matthews, female    DOB: Jan 15, 1971.  [name] is being seen in follow-up for the management of currently controlled type 2 diabetes, hyperlipidemia, hypertension. PMD: Kerri Perches, MD.   Past Medical History:  Diagnosis Date  . Aortic stenosis, mild   . Aortic valve disorders   . Diabetes mellitus   . Diabetes mellitus without complication (HCC)    Phreesia 02/08/2020  . Edema   . Essential hypertension   . Hypertension    [employer]reesia 02/08/2020  . Morbid obesity (HCC)   . Onychomycosis of toenail     Past Surgical History:  Procedure Laterality Date  . CESAREAN SECTION     x2    Social History   Socioeconomic History  . Marital status: Single    Spouse name: Not on file  . Number of children: Not on file  . Years of education: Not on file  . Highest education level: Not on file  Occupational History  . Occupation: CNA - [employer]   Tobacco Use  . Smoking status: Former Smoker    Packs/day: 0.50    Types: Cigarettes    Quit date: 11/23/2014    Years since quitting: 5.5  . Smokeless tobacco: Never Used  Vaping Use  . Vaping Use: Never used  Substance and Sexual Activity  . Alcohol use: Yes    Alcohol/week: 0.0 standard drinks    Comment: occasionally  . Drug use: No  . Sexual activity: Not on file  Other Topics Concern  . Not on file  Social History Narrative  . Not on file   Social Determinants of Health   Financial Resource Strain: Not on file  Food Insecurity: Not on file  Transportation Needs: Not on file  Physical Activity: Not on file  Stress: Not on file  Social Connections: Not on file    Family History  Problem Relation Age of Onset  . Hypertension Mother   . Hypertension Father   . Diabetes Father     Outpatient Encounter Medications as of 06/18/2020  Medication Sig  .  Dulaglutide (TRULICITY) 3 MG/0.5ML SOPN Inject 3 mg as directed once a week.  . Cholecalciferol (VITAMIN D3) 125 MCG (5000 UT) CAPS Take 1 capsule (5,000 Units total) by mouth daily.  Marland Kitchen gabapentin (NEURONTIN) 300 MG capsule Take 1 capsule (300 mg total) by mouth at bedtime.  . hydrochlorothiazide (HYDRODIURIL) 25 MG tablet Take 1 tablet (25 mg total) by mouth daily.  . insulin degludec (TRESIBA FLEXTOUCH) 200 UNIT/ML FlexTouch Pen Inject 50 Units into the skin at bedtime.  Marland Kitchen losartan (COZAAR) 100 MG tablet Take 1 tablet (100 mg total) by mouth daily.  . Multiple Vitamin (MULTIVITAMIN) tablet Take 1 tablet by mouth daily.  Marland Kitchen nystatin (MYCOSTATIN/NYSTOP) powder Apply topically as needed (for rash/irritation).  . pravastatin (PRAVACHOL) 20 MG tablet Take 1 tablet (20 mg total) by mouth daily.  . [DISCONTINUED] Dulaglutide (TRULICITY) 1.5 MG/0.5ML SOPN INJECT 0.5 MLS INTO THE SKIN ONCE A WEEK   No facility-administered encounter medications on file as of 06/18/2020.    ALLERGIES: Allergies  Allergen Reactions  . Penicillins Swelling    Has patient had a PCN reaction causing immediate  rash, facial/tongue/throat swelling, SOB or lightheadedness with hypotension: Yes Has patient had a PCN reaction causing severe rash involving mucus membranes or skin necrosis: No Has patient had a PCN reaction that required hospitalization: No Has patient had a PCN reaction occurring within the last 10 years: Yes If all of the above answers are "NO", then may proceed with Cephalosporin use. Leg swelling/redness  . Ace Inhibitors Cough  . Metformin And Related Nausea Only  . Tramadol Rash    Scant rash on hands  After starting tramadol for knee pain in Jan 16, 2013    VACCINATION STATUS: Immunization History  Administered Date(s) Administered  . Influenza Split 10/27/2013  . Influenza Whole 12/29/2005, 09/29/2009  . Influenza,inj,Quad PF,6+ Mos 10/17/2012, 10/24/2016, 10/01/2018, 10/08/2019  . Moderna  Sars-Covid-2 Vaccination 01/28/2019, 02/25/2019, 12/16/2019  . Pneumococcal Polysaccharide-23 01/12/2006, 07/09/2015  . Tdap 08/12/2010  . Zoster Recombinat (Shingrix) 07/23/2019, 10/08/2019    Diabetes She presents for her follow-up diabetic visit. She has type 2 diabetes mellitus. Onset time: She was diagnosed at approximate age of 50 years, she did have an episode of gestational diabetes. Her disease course has been improving. There are no hypoglycemic associated symptoms. Pertinent negatives for hypoglycemia include no confusion, headaches, pallor or seizures. Pertinent negatives for diabetes include no chest pain, no fatigue, no polydipsia, no polyphagia and no polyuria. There are no hypoglycemic complications. Symptoms are stable. Diabetic complications include peripheral neuropathy. Risk factors for coronary artery disease include diabetes mellitus, dyslipidemia, family history, hypertension, obesity and sedentary lifestyle. Current diabetic treatment includes insulin injections and oral agent (monotherapy). She is compliant with treatment most of the time. Her weight is decreasing steadily. She is following a generally unhealthy diet. When asked about meal planning, she reported none. She has not had a previous visit with a dietitian. She rarely participates in exercise. Her home blood glucose trend is decreasing steadily. Her breakfast blood glucose range is generally 130-140 mg/dl. Her bedtime blood glucose range is generally 140-180 mg/dl. Her overall blood glucose range is 140-180 mg/dl. (She presents with controlled glycemic profile to target levels both fasting and postprandial.  Her average blood glucose is 165 for the last 90 days.  Her point-of-care A1c is 6.6%, overall improving from 10.5%.  No hypoglycemia documented.   ) An ACE inhibitor/angiotensin II receptor blocker is being taken. Eye exam is current.  Hyperlipidemia This is a chronic problem. The current episode started more than 1  year ago. The problem is controlled. Exacerbating diseases include diabetes and obesity. Pertinent negatives include no chest pain, myalgias or shortness of breath. Current antihyperlipidemic treatment includes statins. Risk factors for coronary artery disease include dyslipidemia, family history, obesity, hypertension, a sedentary lifestyle and diabetes mellitus.  Hypertension This is a chronic problem. The current episode started more than 1 year ago. Pertinent negatives include no chest pain, headaches, palpitations or shortness of breath. Risk factors for coronary artery disease include diabetes mellitus, dyslipidemia, obesity and sedentary lifestyle. Past treatments include angiotensin blockers.     Review of systems: Limited as above.  Objective:    BP 126/78   Pulse 80   Ht 5\' 3"  (1.6 m)   Wt (!) 334 lb (151.5 kg)   BMI 59.17 kg/m   Wt Readings from Last 3 Encounters:  06/18/20 (!) 334 lb (151.5 kg)  02/17/20 (!) 340 lb 6.4 oz (154.4 kg)  02/09/20 (!) 321 lb (145.6 kg)     Physical Exam- Limited  Constitutional:  Body mass index is 59.17 kg/m. ,  not in acute distress, normal state of mind    CMP ( most recent) CMP     Component Value Date/Time   NA 145 (H) 02/02/2020 1128   K 4.3 02/02/2020 1128   CL 105 02/02/2020 1128   CO2 24 02/02/2020 1128   GLUCOSE 94 02/02/2020 1128   GLUCOSE 138 (H) 06/19/2019 0721   BUN 26 (H) 02/02/2020 1128   CREATININE 0.94 02/02/2020 1128   CREATININE 0.87 06/19/2019 0721   CALCIUM 9.7 02/02/2020 1128   PROT 7.4 02/02/2020 1128   ALBUMIN 4.3 02/02/2020 1128   AST 29 02/02/2020 1128   ALT 27 02/02/2020 1128   ALKPHOS 87 02/02/2020 1128   BILITOT 0.4 02/02/2020 1128   GFRNONAA 70 02/02/2020 1128   GFRNONAA 101 02/14/2019 0848   GFRAA 81 02/02/2020 1128   GFRAA 117 02/14/2019 0848     Diabetic Labs (most recent): Lab Results  Component Value Date   HGBA1C 6.4 02/17/2020   HGBA1C 6.5 (A) 10/16/2019   HGBA1C 5.6 06/19/2019      Lipid Panel ( most recent) Lipid Panel     Component Value Date/Time   CHOL 194 02/02/2020 1128   TRIG 83 02/02/2020 1128   HDL 86 02/02/2020 1128   CHOLHDL 2.3 02/02/2020 1128   CHOLHDL 2.2 07/23/2019 1010   VLDL 22 03/16/2016 0808   LDLCALC 93 02/02/2020 1128   LDLCALC 89 07/23/2019 1010   LABVLDL 15 02/02/2020 1128      Lab Results  Component Value Date   TSH 0.98 06/19/2019   TSH 0.88 02/14/2019   TSH 0.96 01/27/2017   TSH 1.18 11/05/2015   TSH 1.603 07/10/2014   TSH 1.152 10/14/2012   TSH 1.202 10/14/2012   TSH 0.437 04/20/2011   TSH 1.274 04/06/2009   TSH 1.394 07/11/2007   FREET4 1.2 06/19/2019   FREET4 1.2 02/14/2019     Assessment & Plan:   1. Type 2 diabetes mellitus with diabetic nephropathy, with long-term current use of insulin (HCC)   - Celesta AverLinda D Totton has currently uncontrolled symptomatic type 2 DM since  50 years of age.  She presents with controlled glycemic profile to target levels both fasting and postprandial.  Her average blood glucose is 165 for the last 90 days.  Her point-of-care A1c is 6.6%, overall improving from 10.5%.  No hypoglycemia documented.    -Her recent labs were reviewed with her. - I had a long discussion with her about the progressive nature of diabetes and the pathology behind its complications. -her diabetes is complicated by neuropathy, obesity/sedentary life and she remains at a high risk for more acute and chronic complications which include CAD, CVA, CKD, retinopathy, and neuropathy. These are all discussed in detail with her.  - I have counseled her on diet  and weight management  by adopting a carbohydrate restricted/protein rich diet. Patient is encouraged to switch to  unprocessed or minimally processed     complex starch and increased protein intake (animal or plant source), fruits, and vegetables. -  she is advised to stick to a routine mealtimes to eat 3 meals  a day and avoid unnecessary snacks ( to snack  only to correct hypoglycemia).   - she acknowledges that there is a room for improvement in her food and drink choices. - Suggestion is made for her to avoid simple carbohydrates  from her diet including Cakes, Sweet Desserts, Ice Cream, Soda (diet and regular), Sweet Tea, Candies, Chips, Cookies, Store Bought Juices, Alcohol in  Excess of  1-2 drinks a day, Artificial Sweeteners,  Coffee Creamer, and "Sugar-free" Products, Lemonade. This will help patient to have more stable blood glucose profile and potentially avoid unintended weight gain.   - she will be scheduled with Norm Salt, RDN, CDE for diabetes education.  - I have approached her with the following individualized plan to manage  her diabetes and patient agrees:   -In light of her current glycemic response, she will not need prandial insulin for now.  She will however continue to need basal insulin.  She is advised to continue Tresiba 50 units nightly associated with monitoring of blood glucose twice a day-daily before breakfast and at bedtime.    -She continues to benefit from de-escalation in her insulin to avoid inadvertent hypoglycemia.    - she is warned not to take insulin without proper monitoring per orders. - she is encouraged to call clinic for blood glucose levels less than 70 or above 200 mg /dl.  -She would benefit from a higher dose of Trulicity.  I discussed and increase her Trulicity to 3 mg subcutaneously weekly.    -She reports GI intolerance from metformin.  - Specific targets for  A1c;  LDL, HDL, Triglycerides, and  Waist Circumference were discussed with the patient.  2) Blood Pressure /Hypertension:  -Her blood pressure is controlled to target.  She did not take her medications this morning.   she is advised to continue her current medications including hydrochlorothiazide 25 mg p.o. daily, losartan 100 mg p.o. daily.   3) Lipids/Hyperlipidemia:   Review of her recent lipid panel showed uncontrolled  LDL  at 90 .  she  is advised to continue atorvastatin 20 mg p.o. daily at bedtime.    Side effects and precautions discussed with her.  4)  Weight/Diet: Her BMI is 59.17 -   clearly complicating her diabetes care.   she is  a candidate for weight loss. I discussed with her the fact that loss of 5 - 10% of her  current body weight will have the most impact on her diabetes management.  Exercise, and detailed carbohydrates information provided  -  detailed on discharge instructions. -She may benefit from bariatric surgery, will be discussed in more detail on subsequent visit.  5) vitamin D deficiency She is on ongoing supplement with vitamin D3 5000 units daily.   She is advised to continue.  6) Chronic Care/Health Maintenance:  -she  is on ACEI/ARB and Statin medications and  is encouraged to initiate and continue to follow up with Ophthalmology, Dentist,  Podiatrist at least yearly or according to recommendations, and advised to   stay away from smoking. I have recommended yearly flu vaccine and pneumonia vaccine at least every 5 years; moderate intensity exercise for up to 150 minutes weekly; and  sleep for at least 7 hours a day.  - she is  advised to maintain close follow up with Kerri Perches, MD for primary care needs, as well as her other providers for optimal and coordinated care.    I spent 41 minutes in the care of the patient today including review of labs from CMP, Lipids, Thyroid Function, Hematology (current and previous including abstractions from other facilities); face-to-face time discussing  her blood glucose readings/logs, discussing hypoglycemia and hyperglycemia episodes and symptoms, medications doses, her options of short and long term treatment based on the latest standards of care / guidelines;  discussion about incorporating lifestyle medicine;  and documenting the encounter.  Please refer to Patient Instructions for Blood Glucose Monitoring and Insulin/Medications  Dosing Guide"  in media tab for additional information. Please  also refer to " Patient Self Inventory" in the Media  tab for reviewed elements of pertinent patient history.  Celesta Aver participated in the discussions, expressed understanding, and voiced agreement with the above plans.  All questions were answered to her satisfaction. she is encouraged to contact clinic should she have any questions or concerns prior to her return visit.   Follow up plan: - Return in about 3 months (around 09/18/2020) for F/U with Pre-visit Labs, Meter, Logs, A1c here.Marquis Lunch, MD [facility] [address] [place], [place] [address] Phone: [phone]  Fax: [phone]    06/18/2020, 9:12 AM  This note was partially dictated with voice recognition software. Similar sounding words can be transcribed inadequately or may not  be corrected upon review.

## 2020-06-18 NOTE — Patient Instructions (Signed)

## 2020-08-16 ENCOUNTER — Encounter: Payer: No Typology Code available for payment source | Admitting: Family Medicine

## 2020-08-31 ENCOUNTER — Other Ambulatory Visit: Payer: Self-pay | Admitting: "Endocrinology

## 2020-08-31 ENCOUNTER — Telehealth: Payer: Self-pay

## 2020-08-31 DIAGNOSIS — Z794 Long term (current) use of insulin: Secondary | ICD-10-CM

## 2020-08-31 DIAGNOSIS — E1121 Type 2 diabetes mellitus with diabetic nephropathy: Secondary | ICD-10-CM

## 2020-08-31 MED ORDER — TRULICITY 3 MG/0.5ML ~~LOC~~ SOAJ: 3.0000 mg | SUBCUTANEOUS | 0 refills | Status: DC

## 2020-08-31 MED ORDER — TRESIBA FLEXTOUCH 200 UNIT/ML ~~LOC~~ SOPN: 50.0000 [IU] | PEN_INJECTOR | Freq: Every day | SUBCUTANEOUS | 0 refills | Status: DC

## 2020-08-31 MED ORDER — GLUCOSE BLOOD VI STRP: 1.0000 | ORAL_STRIP | Freq: Two times a day (BID) | 2 refills | Status: AC

## 2020-08-31 NOTE — Telephone Encounter (Signed)
Isabella Matthews with [facility] left a VM stating that they received a RX back in May for her Trulicity. They are asking for a 3 month supply to be sent. Also, the pt is requesting a refill on one touch ultra test strips. She can be reached at [phone]

## 2020-08-31 NOTE — Telephone Encounter (Signed)
Rxs sent

## 2020-09-08 ENCOUNTER — Ambulatory Visit (INDEPENDENT_AMBULATORY_CARE_PROVIDER_SITE_OTHER): Payer: No Typology Code available for payment source | Admitting: Family Medicine

## 2020-09-08 ENCOUNTER — Other Ambulatory Visit: Payer: Self-pay

## 2020-09-08 VITALS — BP 137/82 | HR 89 | Temp 99.0°F | Resp 18 | Ht 63.0 in | Wt 343.0 lb

## 2020-09-08 DIAGNOSIS — Z0000 Encounter for general adult medical examination without abnormal findings: Secondary | ICD-10-CM

## 2020-09-08 DIAGNOSIS — Z23 Encounter for immunization: Secondary | ICD-10-CM | POA: Diagnosis not present

## 2020-09-08 DIAGNOSIS — Z794 Long term (current) use of insulin: Secondary | ICD-10-CM

## 2020-09-08 DIAGNOSIS — I10 Essential (primary) hypertension: Secondary | ICD-10-CM

## 2020-09-08 DIAGNOSIS — E559 Vitamin D deficiency, unspecified: Secondary | ICD-10-CM | POA: Diagnosis not present

## 2020-09-08 DIAGNOSIS — Z6841 Body Mass Index (BMI) 40.0 and over, adult: Secondary | ICD-10-CM

## 2020-09-08 DIAGNOSIS — E1121 Type 2 diabetes mellitus with diabetic nephropathy: Secondary | ICD-10-CM

## 2020-09-08 MED ORDER — TERBINAFINE HCL 250 MG PO TABS: 250.0000 mg | ORAL_TABLET | Freq: Every day | ORAL | 0 refills | Status: DC

## 2020-09-08 NOTE — Patient Instructions (Addendum)
F/U in 4 months, call if you need me sooner  TdAP today.   Please provide your Covid booster info  Call for flu vaccine in September  Microalb today  CBC and vit D with your next blood draw for Dr Fransico Him  90 day supply of terbinafine prescribed for fungal infection of feet  You are referred to Podiatry for foot care  It is important that you exercise regularly at least 30 minutes 5 times a week. If you develop chest pain, have severe difficulty breathing, or feel very tired, stop exercising immediately and seek medical attention   Think about what you will eat, plan ahead. Choose " clean, green, fresh or frozen" over canned, processed or packaged foods which are more sugary, salty and fatty. 70 to 75% of food eaten should be vegetables and fruit. Three meals at set times with snacks allowed between meals, but they must be fruit or vegetables. Aim to eat over a 12 hour period , example 7 am to 7 pm, and STOP after  your last meal of the day. Drink water,generally about 64 ounces per day, no other drink is as healthy. Fruit juice is best enjoyed in a healthy way, by EATING the fruit.  Thanks for choosing [facility], we consider it a privelige to serve you.

## 2020-09-08 NOTE — Progress Notes (Signed)
Isabella Matthews     MRN: [id number]      DOB: 01-Jan-1971  HPI: Patient is in for annual physical exam. No other health concerns are expressed or addressed at the visit. Recent labs,  are reviewed. Immunization is reviewed , and  updated if needed.   PE: BP 137/82   Pulse 89   Temp 99 F (37.2 C)   Resp 18   Ht 5\' 3"  (1.6 m)   Wt (!) 343 lb 0.6 oz (155.6 kg)   SpO2 95%   BMI 60.77 kg/m   Pleasant  female, alert and oriented x 3, in no cardio-pulmonary distress. Afebrile. HEENT No facial trauma or asymetry. Sinuses non tender.  Extra occullar muscles intact.. External ears normal, . Neck: supple, no adenopathy,JVD or thyromegaly.No bruits.  Chest: Clear to ascultation bilaterally.No crackles or wheezes. Non tender to palpation  Breast: No asymetry,no masses or lumps. No tenderness. No nipple discharge or inversion. No axillary or supraclavicular adenopathy  Cardiovascular system; Heart sounds normal,  S1 and  S2 ,no S3.  No murmur, or thrill. Apical beat not displaced Peripheral pulses normal.  Abdomen: Soft, non tender, no organomegaly or masses. No bruits. Bowel sounds normal. No guarding, tenderness or rebound.   .   Musculoskeletal exam: Full ROM of spine, hips , shoulders and  markedly reduced in knees.  deformity ,swelling or crepitus noted. No muscle wasting or atrophy.   Neurologic: Cranial nerves 2 to 12 intact. Power, tone ,sensation and reflexes normal throughout.  disturbance in gait. No tremor.  Skin: Intact, severe bilateral tinea pedis Pigmentation normal throughout  Psych; Normal mood and affect. Judgement and concentration normal   Assessment & Plan:   Annual physical exam Annual exam as documented. Counseling done  re healthy lifestyle involving commitment to 150 minutes exercise per week, heart healthy diet, and attaining healthy weight.The importance of adequate sleep also discussed. Regular seat belt use and home  safety, is also discussed. Changes in health habits are decided on by the patient with goals and time frames  set for achieving them. Immunization and cancer screening needs are specifically addressed at this visit.   Morbid obesity with BMI of 50.0-59.9, adult [facility])  Patient re-educated about  the importance of commitment to a  minimum of 150 minutes of exercise per week as able.  The importance of healthy food choices with portion control discussed, as well as eating regularly and within a 12 hour window most days. The need to choose "clean , green" food 50 to 75% of the time is discussed, as well as to make water the primary drink and set a goal of 64 ounces water daily.    Weight /BMI 09/08/2020 06/18/2020 02/17/2020  WEIGHT 343 lb 0.6 oz 334 lb 340 lb 6.4 oz  HEIGHT 5\' 3"  5\' 3"  5\' 3"   BMI 60.77 kg/m2 59.17 kg/m2 60.3 kg/m2      Type 2 diabetes mellitus with diabetic nephropathy, with long-term current use of insulin (HCC) Controlled, no change in medication Managed by Endo Isabella Matthews is reminded of the importance of commitment to daily physical activity for 30 minutes or more, as able and the need to limit carbohydrate intake to 30 to 60 grams per meal to help with blood sugar control.   The need to take medication as prescribed, test blood sugar as directed, and to call between visits if there is a concern that blood sugar is uncontrolled is also discussed.   Isabella Matthews is  reminded of the importance of daily foot exam, annual eye examination, and good blood sugar, blood pressure and cholesterol control.  Diabetic Labs Latest Ref Rng & Units 06/18/2020 02/17/2020 02/02/2020 10/16/2019 07/23/2019  HbA1c 0.0 - 7.0 % 6.6 6.4 - 6.5(A) -  Microalbumin Not Estab. ug/mL - - - - 3.0(H)  Micro/Creat Ratio <30 mcg/mg creat - - - - -  Chol 100 - 199 mg/dL - - 073 - 710  HDL >62 mg/dL - - 86 - 86  Calc LDL 0 - 99 mg/dL - - 93 - 89  Triglycerides 0 - 149 mg/dL - - 83 - 80  Creatinine 0.57  - 1.00 mg/dL - - 6.94 - -   BP/Weight 09/08/2020 06/18/2020 02/17/2020 02/09/2020 10/16/2019 10/08/2019 08/14/2019  Systolic BP 137 126 142 125 142 138 136  Diastolic BP 82 78 88 72 86 82 82  Wt. (Lbs) 343.04 334 340.4 321 336.6 339 352.8  BMI 60.77 59.17 60.3 56.86 59.63 60.05 62.5   Foot/eye exam completion dates Latest Ref Rng & Units 01/20/2020 10/08/2019  Eye Exam No Retinopathy No Retinopathy -  Foot exam Order - - -  Foot Form Completion - - Done

## 2020-09-09 ENCOUNTER — Encounter: Payer: Self-pay | Admitting: Family Medicine

## 2020-09-09 NOTE — Assessment & Plan Note (Signed)
Controlled, no change in medication Managed by Endo Ms. Street is reminded of the importance of commitment to daily physical activity for 30 minutes or more, as able and the need to limit carbohydrate intake to 30 to 60 grams per meal to help with blood sugar control.   The need to take medication as prescribed, test blood sugar as directed, and to call between visits if there is a concern that blood sugar is uncontrolled is also discussed.   Isabella Matthews is reminded of the importance of daily foot exam, annual eye examination, and good blood sugar, blood pressure and cholesterol control.  Diabetic Labs Latest Ref Rng & Units 06/18/2020 02/17/2020 02/02/2020 10/16/2019 07/23/2019  HbA1c 0.0 - 7.0 % 6.6 6.4 - 6.5(A) -  Microalbumin Not Estab. ug/mL - - - - 3.0(H)  Micro/Creat Ratio <30 mcg/mg creat - - - - -  Chol 100 - 199 mg/dL - - 993 - 570  HDL >17 mg/dL - - 86 - 86  Calc LDL 0 - 99 mg/dL - - 93 - 89  Triglycerides 0 - 149 mg/dL - - 83 - 80  Creatinine 0.57 - 1.00 mg/dL - - 7.93 - -   BP/Weight 09/08/2020 06/18/2020 02/17/2020 02/09/2020 10/16/2019 10/08/2019 08/14/2019  Systolic BP 137 126 142 125 142 138 136  Diastolic BP 82 78 88 72 86 82 82  Wt. (Lbs) 343.04 334 340.4 321 336.6 339 352.8  BMI 60.77 59.17 60.3 56.86 59.63 60.05 62.5   Foot/eye exam completion dates Latest Ref Rng & Units 01/20/2020 10/08/2019  Eye Exam No Retinopathy No Retinopathy -  Foot exam Order - - -  Foot Form Completion - - Done

## 2020-09-09 NOTE — Assessment & Plan Note (Signed)
  Patient re-educated about  the importance of commitment to a  minimum of 150 minutes of exercise per week as able.  The importance of healthy food choices with portion control discussed, as well as eating regularly and within a 12 hour window most days. The need to choose "clean , green" food 50 to 75% of the time is discussed, as well as to make water the primary drink and set a goal of 64 ounces water daily.    Weight /BMI 09/08/2020 06/18/2020 02/17/2020  WEIGHT 343 lb 0.6 oz 334 lb 340 lb 6.4 oz  HEIGHT 5\' 3"  5\' 3"  5\' 3"   BMI 60.77 kg/m2 59.17 kg/m2 60.3 kg/m2

## 2020-09-09 NOTE — Assessment & Plan Note (Signed)

## 2020-09-10 LAB — MICROALBUMIN / CREATININE URINE RATIO
Creatinine, Urine: 116.4 mg/dL
Microalb/Creat Ratio: 10 mg/g creat (ref 0–29)
Microalbumin, Urine: 11.7 ug/mL

## 2020-09-18 LAB — COMPREHENSIVE METABOLIC PANEL
ALT: 20 IU/L (ref 0–32)
AST: 19 IU/L (ref 0–40)
Albumin/Globulin Ratio: 1.7 (ref 1.2–2.2)
Albumin: 4.6 g/dL (ref 3.8–4.9)
Alkaline Phosphatase: 81 IU/L (ref 44–121)
BUN/Creatinine Ratio: 17 (ref 9–23)
BUN: 14 mg/dL (ref 6–24)
Bilirubin Total: 0.5 mg/dL (ref 0.0–1.2)
CO2: 23 mmol/L (ref 20–29)
Calcium: 9.9 mg/dL (ref 8.7–10.2)
Chloride: 100 mmol/L (ref 96–106)
Creatinine, Ser: 0.84 mg/dL (ref 0.57–1.00)
Globulin, Total: 2.7 g/dL (ref 1.5–4.5)
Glucose: 114 mg/dL — ABNORMAL HIGH (ref 65–99)
Potassium: 4.1 mmol/L (ref 3.5–5.2)
Sodium: 140 mmol/L (ref 134–144)
Total Protein: 7.3 g/dL (ref 6.0–8.5)
eGFR: 84 mL/min/{1.73_m2} (ref >59)

## 2020-09-18 LAB — LIPID PANEL
Chol/HDL Ratio: 2.4 ratio (ref 0.0–4.4)
Cholesterol, Total: 183 mg/dL (ref 100–199)
HDL: 75 mg/dL (ref >39)
LDL Chol Calc (NIH): 90 mg/dL (ref 0–99)
Triglycerides: 104 mg/dL (ref 0–149)
VLDL Cholesterol Cal: 18 mg/dL (ref 5–40)

## 2020-09-18 LAB — CBC
Hematocrit: 35.7 % (ref 34.0–46.6)
Hemoglobin: 12.2 g/dL (ref 11.1–15.9)
MCH: 30.5 pg (ref 26.6–33.0)
MCHC: 34.2 g/dL (ref 31.5–35.7)
MCV: 89 fL (ref 79–97)
Platelets: 267 10*3/uL (ref 150–450)
RBC: 4 x10E6/uL (ref 3.77–5.28)
RDW: 12.6 % (ref 11.7–15.4)
WBC: 7.5 10*3/uL (ref 3.4–10.8)

## 2020-09-18 LAB — TSH: TSH: 0.838 u[IU]/mL (ref 0.450–4.500)

## 2020-09-18 LAB — VITAMIN D 25 HYDROXY (VIT D DEFICIENCY, FRACTURES): Vit D, 25-Hydroxy: 46 ng/mL (ref 30.0–100.0)

## 2020-09-18 LAB — T4, FREE: Free T4: 1.36 ng/dL (ref 0.82–1.77)

## 2020-09-24 ENCOUNTER — Encounter: Payer: Self-pay | Admitting: "Endocrinology

## 2020-09-24 ENCOUNTER — Ambulatory Visit (INDEPENDENT_AMBULATORY_CARE_PROVIDER_SITE_OTHER): Payer: No Typology Code available for payment source | Admitting: "Endocrinology

## 2020-09-24 VITALS — BP 141/83 | HR 96 | Ht 63.0 in | Wt 335.8 lb

## 2020-09-24 DIAGNOSIS — E559 Vitamin D deficiency, unspecified: Secondary | ICD-10-CM

## 2020-09-24 DIAGNOSIS — I10 Essential (primary) hypertension: Secondary | ICD-10-CM | POA: Diagnosis not present

## 2020-09-24 DIAGNOSIS — Z794 Long term (current) use of insulin: Secondary | ICD-10-CM

## 2020-09-24 DIAGNOSIS — E1121 Type 2 diabetes mellitus with diabetic nephropathy: Secondary | ICD-10-CM | POA: Diagnosis not present

## 2020-09-24 DIAGNOSIS — E782 Mixed hyperlipidemia: Secondary | ICD-10-CM | POA: Diagnosis not present

## 2020-09-24 LAB — POCT GLYCOSYLATED HEMOGLOBIN (HGB A1C): Hemoglobin A1C: 6.6 % — AB (ref 4.0–5.6)

## 2020-09-24 NOTE — Progress Notes (Signed)
09/24/2020, 10:14 AM                                 Endocrinology follow-up note   Subjective:    Patient ID: Isabella Matthews, female    DOB: March 25, 1970.  Isabella AverLinda D Lenig is being seen in follow-up for the management of currently controlled type 2 diabetes, hyperlipidemia, hypertension. PMD: Kerri PerchesSimpson, Margaret E, MD.   Past Medical History:  Diagnosis Date   Aortic stenosis, mild    Aortic valve disorders    Diabetes mellitus    Diabetes mellitus without complication (HCC)    Phreesia 02/08/2020   Edema    Essential hypertension    Hypertension    Phreesia 02/08/2020   Morbid obesity (HCC)    Onychomycosis of toenail     Past Surgical History:  Procedure Laterality Date   CESAREAN SECTION     x2    Social History   Socioeconomic History   Marital status: Single    Spouse name: Not on file   Number of children: Not on file   Years of education: Not on file   Highest education level: Not on file  Occupational History   Occupation: CNA - [employer]   Tobacco Use   Smoking status: Former    Packs/day: 0.50    Types: Cigarettes    Quit date: 11/23/2014    Years since quitting: 5.8   Smokeless tobacco: Never  Vaping Use   Vaping Use: Never used  Substance and Sexual Activity   Alcohol use: Yes    Alcohol/week: 0.0 standard drinks    Comment: occasionally   Drug use: No   Sexual activity: Not on file  Other Topics Concern   Not on file  Social History Narrative   Not on file   Social Determinants of Health   Financial Resource Strain: Not on file  Food Insecurity: Not on file  Transportation Needs: Not on file  Physical Activity: Not on file  Stress: Not on file  Social Connections: Not on file    Family History  Problem Relation Age of Onset   Hypertension Mother    Hypertension Father    Diabetes Father     Outpatient Encounter Medications as of 09/24/2020  Medication Sig   Cholecalciferol (VITAMIN D3) 125 MCG  (5000 UT) CAPS Take 1 capsule (5,000 Units total) by mouth daily.   Dulaglutide (TRULICITY) 3 MG/0.5ML SOPN Inject 3 mg as directed once a week.   gabapentin (NEURONTIN) 300 MG capsule Take 1 capsule (300 mg total) by mouth at bedtime.   glucose blood test strip 1 each by Other route 2 (two) times daily. Use as instructed   hydrochlorothiazide (HYDRODIURIL) 25 MG tablet Take 1 tablet (25 mg total) by mouth daily.   insulin degludec (TRESIBA FLEXTOUCH) 200 UNIT/ML FlexTouch Pen Inject 50 Units into the skin at bedtime.   losartan (COZAAR) 100 MG tablet Take 1 tablet (100 mg total) by mouth daily.   Multiple Vitamin (MULTIVITAMIN) tablet Take 1 tablet by mouth daily.   nystatin (MYCOSTATIN/NYSTOP) powder Apply topically as needed (for rash/irritation).   pravastatin (PRAVACHOL) 20 MG tablet Take 1 tablet (20 mg total) by mouth daily.   terbinafine (LAMISIL) 250 MG tablet Take 1 tablet (250 mg total) by mouth daily.   No facility-administered encounter medications on file as of 09/24/2020.    ALLERGIES: Allergies  Allergen Reactions  Penicillins Swelling    Has patient had a PCN reaction causing immediate rash, facial/tongue/throat swelling, SOB or lightheadedness with hypotension: Yes Has patient had a PCN reaction causing severe rash involving mucus membranes or skin necrosis: No Has patient had a PCN reaction that required hospitalization: No Has patient had a PCN reaction occurring within the last 10 years: Yes If all of the above answers are "NO", then may proceed with Cephalosporin use. Leg swelling/redness   Ace Inhibitors Cough   Metformin And Related Nausea Only   Tramadol Rash    Scant rash on hands  After starting tramadol for knee pain in Jan 16, 2013    VACCINATION STATUS: Immunization History  Administered Date(s) Administered   Influenza Split 10/27/2013   Influenza Whole 12/29/2005, 09/29/2009   Influenza,inj,Quad PF,6+ Mos 10/17/2012, 10/24/2016, 10/01/2018,  10/08/2019   Moderna Sars-Covid-2 Vaccination 01/28/2019, 12/16/2019, 07/22/2020   Pneumococcal Polysaccharide-23 01/12/2006, 07/09/2015   Tdap 08/12/2010, 09/08/2020   Zoster Recombinat (Shingrix) 07/23/2019, 10/08/2019    Diabetes She presents for her follow-up diabetic visit. She has type 2 diabetes mellitus. Onset time: She was diagnosed at approximate age of 9 years, she did have an episode of gestational diabetes. Her disease course has been improving. There are no hypoglycemic associated symptoms. Pertinent negatives for hypoglycemia include no confusion, headaches, pallor or seizures. Pertinent negatives for diabetes include no chest pain, no fatigue, no polydipsia, no polyphagia and no polyuria. There are no hypoglycemic complications. Symptoms are improving. Diabetic complications include peripheral neuropathy. Risk factors for coronary artery disease include diabetes mellitus, dyslipidemia, family history, hypertension, obesity and sedentary lifestyle. Current diabetic treatment includes insulin injections and oral agent (monotherapy). She is compliant with treatment most of the time. Her weight is fluctuating minimally. She is following a generally unhealthy diet. When asked about meal planning, she reported none. She has not had a previous visit with a dietitian. She rarely participates in exercise. Her home blood glucose trend is decreasing steadily. Her breakfast blood glucose range is generally 130-140 mg/dl. Her bedtime blood glucose range is generally 140-180 mg/dl. Her overall blood glucose range is 140-180 mg/dl. (She presents with continued improvement in her glycemic profile.  Her point-of-care A1c is 6.6%, overall improving from 10.5%.  No hypoglycemia documented or reported.   Her most recent average blood glucose is 170 for the last 14 days. ) An ACE inhibitor/angiotensin II receptor blocker is being taken. Eye exam is current.  Hyperlipidemia This is a chronic problem. The  current episode started more than 1 year ago. The problem is controlled. Exacerbating diseases include diabetes and obesity. Pertinent negatives include no chest pain, myalgias or shortness of breath. Current antihyperlipidemic treatment includes statins. Risk factors for coronary artery disease include dyslipidemia, family history, obesity, hypertension, a sedentary lifestyle and diabetes mellitus.  Hypertension This is a chronic problem. The current episode started more than 1 year ago. Pertinent negatives include no chest pain, headaches, palpitations or shortness of breath. Risk factors for coronary artery disease include diabetes mellitus, dyslipidemia, obesity and sedentary lifestyle. Past treatments include angiotensin blockers.    Review of systems: Limited as above.  Objective:    BP (!) 141/83   Pulse 96   Ht 5\' 3"  (1.6 m)   Wt (!) 335 lb 12.8 oz (152.3 kg)   BMI 59.48 kg/m   Wt Readings from Last 3 Encounters:  09/24/20 (!) 335 lb 12.8 oz (152.3 kg)  09/08/20 (!) 343 lb 0.6 oz (155.6 kg)  06/18/20 (!) 334 lb (151.5  kg)     Physical Exam- Limited  Constitutional:  Body mass index is 59.48 kg/m. , not in acute distress, normal state of mind    CMP ( most recent) CMP     Component Value Date/Time   NA 140 09/17/2020 1123   K 4.1 09/17/2020 1123   CL 100 09/17/2020 1123   CO2 23 09/17/2020 1123   GLUCOSE 114 (H) 09/17/2020 1123   GLUCOSE 138 (H) 06/19/2019 0721   BUN 14 09/17/2020 1123   CREATININE 0.84 09/17/2020 1123   CREATININE 0.87 06/19/2019 0721   CALCIUM 9.9 09/17/2020 1123   PROT 7.3 09/17/2020 1123   ALBUMIN 4.6 09/17/2020 1123   AST 19 09/17/2020 1123   ALT 20 09/17/2020 1123   ALKPHOS 81 09/17/2020 1123   BILITOT 0.5 09/17/2020 1123   GFRNONAA 70 02/02/2020 1128   GFRNONAA 101 02/14/2019 0848   GFRAA 81 02/02/2020 1128   GFRAA 117 02/14/2019 0848     Diabetic Labs (most recent): Lab Results  Component Value Date   HGBA1C 6.6 (A) 09/24/2020    HGBA1C 6.6 06/18/2020   HGBA1C 6.4 02/17/2020     Lipid Panel ( most recent) Lipid Panel     Component Value Date/Time   CHOL 183 09/17/2020 1123   TRIG 104 09/17/2020 1123   HDL 75 09/17/2020 1123   CHOLHDL 2.4 09/17/2020 1123   CHOLHDL 2.2 07/23/2019 1010   VLDL 22 03/16/2016 0808   LDLCALC 90 09/17/2020 1123   LDLCALC 89 07/23/2019 1010   LABVLDL 18 09/17/2020 1123      Lab Results  Component Value Date   TSH 0.838 09/17/2020   TSH 0.98 06/19/2019   TSH 0.88 02/14/2019   TSH 0.96 01/27/2017   TSH 1.18 11/05/2015   TSH 1.603 07/10/2014   TSH 1.152 10/14/2012   TSH 1.202 10/14/2012   TSH 0.437 04/20/2011   TSH 1.274 04/06/2009   FREET4 1.36 09/17/2020   FREET4 1.2 06/19/2019   FREET4 1.2 02/14/2019     Assessment & Plan:   1. Type 2 diabetes mellitus with diabetic nephropathy, with long-term current use of insulin (HCC)   - SHANNETTE TABARES has currently uncontrolled symptomatic type 2 DM since  50 years of age.  She presents with continued improvement in her glycemic profile.  Her point-of-care A1c is 6.6%, overall improving from 10.5%.  No hypoglycemia documented or reported.     -Her recent labs were reviewed with her. - I had a long discussion with her about the progressive nature of diabetes and the pathology behind its complications. -her diabetes is complicated by neuropathy, obesity/sedentary life and she remains at a high risk for more acute and chronic complications which include CAD, CVA, CKD, retinopathy, and neuropathy. These are all discussed in detail with her.  - I have counseled her on diet  and weight management  by adopting a carbohydrate restricted/protein rich diet. Patient is encouraged to switch to  unprocessed or minimally processed     complex starch and increased protein intake (animal or plant source), fruits, and vegetables. -  she is advised to stick to a routine mealtimes to eat 3 meals  a day and avoid unnecessary snacks ( to  snack only to correct hypoglycemia).   - she acknowledges that there is a room for improvement in her food and drink choices. - Suggestion is made for her to avoid simple carbohydrates  from her diet including Cakes, Sweet Desserts, Ice Cream, Soda (diet and regular), Sweet  Tea, Candies, Chips, Cookies, Store Bought Juices, Alcohol in Excess of  1-2 drinks a day, Artificial Sweeteners,  Coffee Creamer, and "Sugar-free" Products, Lemonade. This will help patient to have more stable blood glucose profile and potentially avoid unintended weight gain.   - she will be scheduled with Norm Salt, RDN, CDE for diabetes education.  - I have approached her with the following individualized plan to manage  her diabetes and patient agrees:   -In light of her presentation with target A1c of 6.6%, despite her recent average 1.17 mg per DL, she will not need prandial insulin for now.   -She is advised to continue Tresiba 50 units nightly, associated with monitoring of blood glucose twice daily-daily before breakfast and at bedtime.   -She is benefiting and advised to continue Trulicity 3 mg subcutaneously weekly.  Her next prescription for Trulicity will be 4.5 mg subcutaneously weekly.  - she is warned not to take insulin without proper monitoring per orders. - she is encouraged to call clinic for blood glucose levels less than 70 or above 200 mg /dl.   -She reports GI intolerance from metformin.  - Specific targets for  A1c;  LDL, HDL, Triglycerides, and  Waist Circumference were discussed with the patient.  2) Blood Pressure /Hypertension:  -Her blood pressure is not controlled to target.  She did not take her medications this morning.   she is advised to continue her current medications including hydrochlorothiazide 25 mg p.o. daily, losartan 100 mg p.o. daily.   3) Lipids/Hyperlipidemia: Recent fasting lipid panel showed LDL of 90, triglycerides 104.  She is advised to continue atorvastatin 20 mg  p.o. daily at bedtime.      Side effects and precautions discussed with her.  4)  Weight/Diet: Her BMI is 5 59.4- -   clearly complicating her diabetes care.   she is  a candidate for weight loss. I discussed with her the fact that loss of 5 - 10% of her  current body weight will have the most impact on her diabetes management.  Exercise, and detailed carbohydrates information provided  -  detailed on discharge instructions. -She may benefit from bariatric surgery, will be discussed in more detail on subsequent visit.  5) vitamin D deficiency She is on ongoing supplement with vitamin D3 5000 units daily.  She is now vitamin D replete at 46.  She is advised to continue.  6) Chronic Care/Health Maintenance:  -she  is on ACEI/ARB and Statin medications and  is encouraged to initiate and continue to follow up with Ophthalmology, Dentist,  Podiatrist at least yearly or according to recommendations, and advised to   stay away from smoking. I have recommended yearly flu vaccine and pneumonia vaccine at least every 5 years; moderate intensity exercise for up to 150 minutes weekly; and  sleep for at least 7 hours a day.  - she is  advised to maintain close follow up with Kerri Perches, MD for primary care needs, as well as her other providers for optimal and coordinated care.    I spent 40 minutes in the care of the patient today including review of labs from CMP, Lipids, Thyroid Function, Hematology (current and previous including abstractions from other facilities); face-to-face time discussing  her blood glucose readings/logs, discussing hypoglycemia and hyperglycemia episodes and symptoms, medications doses, her options of short and long term treatment based on the latest standards of care / guidelines;  discussion about incorporating lifestyle medicine;  and documenting the encounter.  Please refer to Patient Instructions for Blood Glucose Monitoring and Insulin/Medications Dosing Guide"  in  media tab for additional information. Please  also refer to " Patient Self Inventory" in the Media  tab for reviewed elements of pertinent patient history.  Isabella Aver participated in the discussions, expressed understanding, and voiced agreement with the above plans.  All questions were answered to her satisfaction. she is encouraged to contact clinic should she have any questions or concerns prior to her return visit.   Follow up plan: - Return in about 4 months (around 01/24/2021) for Bring Meter and Logs- A1c in Office.  Marquis Lunch, MD [facility] [address] [place], [place] [address] Phone: [phone]  Fax: [phone]    09/24/2020, 10:14 AM  This note was partially dictated with voice recognition software. Similar sounding words can be transcribed inadequately or may not  be corrected upon review.

## 2020-09-24 NOTE — Patient Instructions (Signed)

## 2020-11-05 ENCOUNTER — Other Ambulatory Visit: Payer: Self-pay | Admitting: Family Medicine

## 2020-11-23 ENCOUNTER — Other Ambulatory Visit: Payer: Self-pay | Admitting: "Endocrinology

## 2020-11-23 ENCOUNTER — Telehealth: Payer: Self-pay | Admitting: "Endocrinology

## 2020-11-23 DIAGNOSIS — Z794 Long term (current) use of insulin: Secondary | ICD-10-CM

## 2020-11-23 DIAGNOSIS — E1121 Type 2 diabetes mellitus with diabetic nephropathy: Secondary | ICD-10-CM

## 2020-11-23 MED ORDER — TRULICITY 4.5 MG/0.5ML ~~LOC~~ SOAJ: 4.5000 mg | SUBCUTANEOUS | 2 refills | Status: DC

## 2020-11-23 MED ORDER — INSULIN DEGLUDEC FLEXTOUCH 200 UNIT/ML ~~LOC~~ SOPN: 50.0000 [IU] | PEN_INJECTOR | Freq: Every day | SUBCUTANEOUS | 2 refills | Status: DC

## 2020-11-23 NOTE — Telephone Encounter (Signed)
Pharmacy has called checking to see if the Trulicity was going to be sent in. Patient needs both medications. Thanks.

## 2020-11-23 NOTE — Telephone Encounter (Signed)
Last OV 09/24/2020  Next OV 01/28/2021  Last OV note states to proceed to Trulicity 4.5 weekly. I wanted to confirm that this is still correct and will need to change to new Rx for Trulicity 4.5 mg weekly.

## 2020-11-24 MED ORDER — TRULICITY 4.5 MG/0.5ML ~~LOC~~ SOAJ
4.5000 mg | SUBCUTANEOUS | 2 refills | Status: DC
Start: 2020-11-24 — End: 2021-01-28

## 2020-11-24 NOTE — Telephone Encounter (Signed)
Rx sent to [facility].

## 2020-11-24 NOTE — Telephone Encounter (Signed)
The Trulicity was sent into [facility], that RX needs to go to [facility]. Please advise

## 2020-11-24 NOTE — Addendum Note (Signed)
Addended by: Derrell Lolling on: 11/24/2020 11:41 AM   Modules accepted: Orders

## 2020-11-30 ENCOUNTER — Telehealth: Payer: Self-pay | Admitting: "Endocrinology

## 2020-11-30 NOTE — Telephone Encounter (Signed)
Patient is requesting a call back.

## 2020-11-30 NOTE — Telephone Encounter (Signed)
Discussed with pt Dr.Nida increased her Trulicity to 4.5mg  once a week. Understanding voiced.

## 2021-01-12 ENCOUNTER — Ambulatory Visit: Payer: No Typology Code available for payment source | Admitting: Family Medicine

## 2021-01-28 ENCOUNTER — Encounter: Payer: Self-pay | Admitting: "Endocrinology

## 2021-01-28 ENCOUNTER — Ambulatory Visit (INDEPENDENT_AMBULATORY_CARE_PROVIDER_SITE_OTHER): Payer: No Typology Code available for payment source | Admitting: "Endocrinology

## 2021-01-28 ENCOUNTER — Other Ambulatory Visit: Payer: Self-pay

## 2021-01-28 VITALS — BP 146/92 | HR 76 | Ht 63.0 in | Wt 350.0 lb

## 2021-01-28 DIAGNOSIS — E559 Vitamin D deficiency, unspecified: Secondary | ICD-10-CM

## 2021-01-28 DIAGNOSIS — Z794 Long term (current) use of insulin: Secondary | ICD-10-CM | POA: Diagnosis not present

## 2021-01-28 DIAGNOSIS — E1121 Type 2 diabetes mellitus with diabetic nephropathy: Secondary | ICD-10-CM | POA: Diagnosis not present

## 2021-01-28 DIAGNOSIS — E782 Mixed hyperlipidemia: Secondary | ICD-10-CM

## 2021-01-28 DIAGNOSIS — I10 Essential (primary) hypertension: Secondary | ICD-10-CM | POA: Diagnosis not present

## 2021-01-28 LAB — POCT GLYCOSYLATED HEMOGLOBIN (HGB A1C): HbA1c, POC (controlled diabetic range): 6.3 % (ref 0.0–7.0)

## 2021-01-28 MED ORDER — TRULICITY 3 MG/0.5ML ~~LOC~~ SOAJ: 3.0000 mg | SUBCUTANEOUS | 2 refills | Status: DC

## 2021-01-28 NOTE — Progress Notes (Signed)
01/28/2021, 12:27 PM                                 Endocrinology follow-up note   Subjective:    Patient ID: Isabella Matthews, female    DOB: 1970-01-28.  Isabella Matthews is being seen in follow-up for the management of currently controlled type 2 diabetes, hyperlipidemia, hypertension. PMD: Fayrene Helper, MD.   Past Medical History:  Diagnosis Date   Aortic stenosis, mild    Aortic valve disorders    Diabetes mellitus    Diabetes mellitus without complication ([place])    Phreesia 02/08/2020   Edema    Essential hypertension    Hypertension    Phreesia 02/08/2020   Morbid obesity ([place])    Onychomycosis of toenail     Past Surgical History:  Procedure Laterality Date   CESAREAN SECTION     x2    Social History   Socioeconomic History   Marital status: Single    Spouse name: Not on file   Number of children: Not on file   Years of education: Not on file   Highest education level: Not on file  Occupational History   Occupation: CNA - [employer]   Tobacco Use   Smoking status: Former    Packs/day: 0.50    Types: Cigarettes    Quit date: 11/23/2014    Years since quitting: 6.1   Smokeless tobacco: Never  Vaping Use   Vaping Use: Never used  Substance and Sexual Activity   Alcohol use: Yes    Alcohol/week: 0.0 standard drinks    Comment: occasionally   Drug use: No   Sexual activity: Not on file  Other Topics Concern   Not on file  Social History Narrative   Not on file   Social Determinants of Health   Financial Resource Strain: Not on file  Food Insecurity: Not on file  Transportation Needs: Not on file  Physical Activity: Not on file  Stress: Not on file  Social Connections: Not on file    Family History  Problem Relation Age of Onset   Hypertension Mother    Hypertension Father    Diabetes Father     Outpatient Encounter Medications as of 01/28/2021  Medication Sig   Dulaglutide (TRULICITY) 3 0000000 SOPN  Inject 3 mg as directed once a week.   Cholecalciferol (VITAMIN D3) 125 MCG (5000 UT) CAPS Take 1 capsule (5,000 Units total) by mouth daily. (Patient not taking: Reported on 01/28/2021)   gabapentin (NEURONTIN) 300 MG capsule TAKE 1 CAPSULE (300 MG TOTAL) BY MOUTH AT BEDTIME.   glucose blood test strip 1 each by Other route 2 (two) times daily. Use as instructed   hydrochlorothiazide (HYDRODIURIL) 25 MG tablet TAKE 1 TABLET (25 MG TOTAL) BY MOUTH DAILY.   Insulin Degludec FlexTouch 200 UNIT/ML SOPN Inject 50 Units into the skin at bedtime.   losartan (COZAAR) 100 MG tablet TAKE 1 TABLET (100 MG TOTAL) BY MOUTH DAILY.   Multiple Vitamin (MULTIVITAMIN) tablet Take 1 tablet by mouth daily.   nystatin (MYCOSTATIN/NYSTOP) powder Apply topically as needed (for rash/irritation).   pravastatin (PRAVACHOL) 20 MG tablet TAKE 1 TABLET (20 MG TOTAL) BY MOUTH DAILY.   terbinafine (LAMISIL) 250 MG tablet Take 1 tablet (250 mg total) by mouth daily. (Patient not taking: Reported on 01/28/2021)   [DISCONTINUED] Dulaglutide (TRULICITY) 4.5 0000000 SOPN Inject  4.5 mg as directed once a week.   No facility-administered encounter medications on file as of 01/28/2021.    ALLERGIES: Allergies  Allergen Reactions   Penicillins Swelling    Has patient had a PCN reaction causing immediate rash, facial/tongue/throat swelling, SOB or lightheadedness with hypotension: Yes Has patient had a PCN reaction causing severe rash involving mucus membranes or skin necrosis: No Has patient had a PCN reaction that required hospitalization: No Has patient had a PCN reaction occurring within the last 10 years: Yes If all of the above answers are "NO", then may proceed with Cephalosporin use. Leg swelling/redness   Ace Inhibitors Cough   Metformin And Related Nausea Only   Tramadol Rash    Scant rash on hands  After starting tramadol for knee pain in Jan 16, 2013    VACCINATION STATUS: Immunization History  Administered Date(s)  Administered   Influenza Split 10/27/2013   Influenza Whole 12/29/2005, 09/29/2009   Influenza,inj,Quad PF,6+ Mos 10/17/2012, 10/24/2016, 10/01/2018, 10/08/2019   Moderna Sars-Covid-2 Vaccination 01/28/2019, 12/16/2019, 07/22/2020   Pneumococcal Polysaccharide-23 01/12/2006, 07/09/2015   Tdap 08/12/2010, 09/08/2020   Zoster Recombinat (Shingrix) 07/23/2019, 10/08/2019    Diabetes She presents for her follow-up diabetic visit. She has type 2 diabetes mellitus. Onset time: She was diagnosed at approximate age of 34 years, she did have an episode of gestational diabetes. Her disease course has been improving. There are no hypoglycemic associated symptoms. Pertinent negatives for hypoglycemia include no confusion, headaches, pallor or seizures. Pertinent negatives for diabetes include no chest pain, no fatigue, no polydipsia, no polyphagia and no polyuria. There are no hypoglycemic complications. Symptoms are improving. Diabetic complications include peripheral neuropathy. Risk factors for coronary artery disease include diabetes mellitus, dyslipidemia, family history, hypertension, obesity and sedentary lifestyle. Current diabetic treatment includes insulin injections and oral agent (monotherapy). She is compliant with treatment most of the time. Her weight is increasing steadily. She is following a generally unhealthy diet. When asked about meal planning, she reported none. She has not had a previous visit with a dietitian. She rarely participates in exercise. Her home blood glucose trend is decreasing steadily. Her breakfast blood glucose range is generally 130-140 mg/dl. Her bedtime blood glucose range is generally 140-180 mg/dl. Her overall blood glucose range is 140-180 mg/dl. Isabella Matthews presents with continued improvement in her glycemic profile with no hypoglycemia, point-of-care A1c of 6.3% overall improving from 10.5%.  Her most recent blood glucose average is between 147-160 for the last 30 days. ) An  ACE inhibitor/angiotensin II receptor blocker is being taken. Eye exam is current.  Hyperlipidemia This is a chronic problem. The current episode started more than 1 year ago. The problem is controlled. Exacerbating diseases include diabetes and obesity. Pertinent negatives include no chest pain, myalgias or shortness of breath. Current antihyperlipidemic treatment includes statins. Risk factors for coronary artery disease include dyslipidemia, family history, obesity, hypertension, a sedentary lifestyle and diabetes mellitus.  Hypertension This is a chronic problem. The current episode started more than 1 year ago. Pertinent negatives include no chest pain, headaches, palpitations or shortness of breath. Risk factors for coronary artery disease include diabetes mellitus, dyslipidemia, obesity and sedentary lifestyle. Past treatments include angiotensin blockers.    Review of systems: Limited as above.  Objective:    BP (!) 146/92    Pulse 76    Ht 5\' 3"  (1.6 m)    Wt (!) 350 lb (158.8 kg)    BMI 62.00 kg/m   Wt Readings from Last 3  Encounters:  01/28/21 (!) 350 lb (158.8 kg)  09/24/20 (!) 335 lb 12.8 oz (152.3 kg)  09/08/20 (!) 343 lb 0.6 oz (155.6 kg)     Physical Exam- Limited  Constitutional:  Body mass index is 62 kg/m. , not in acute distress, normal state of mind    CMP ( most recent) CMP     Component Value Date/Time   NA 140 09/17/2020 1123   K 4.1 09/17/2020 1123   CL 100 09/17/2020 1123   CO2 23 09/17/2020 1123   GLUCOSE 114 (H) 09/17/2020 1123   GLUCOSE 138 (H) 06/19/2019 0721   BUN 14 09/17/2020 1123   CREATININE 0.84 09/17/2020 1123   CREATININE 0.87 06/19/2019 0721   CALCIUM 9.9 09/17/2020 1123   PROT 7.3 09/17/2020 1123   ALBUMIN 4.6 09/17/2020 1123   AST 19 09/17/2020 1123   ALT 20 09/17/2020 1123   ALKPHOS 81 09/17/2020 1123   BILITOT 0.5 09/17/2020 1123   GFRNONAA 70 02/02/2020 1128   GFRNONAA 101 02/14/2019 0848   GFRAA 81 02/02/2020 1128   GFRAA  117 02/14/2019 0848     Diabetic Labs (most recent): Lab Results  Component Value Date   HGBA1C 6.3 01/28/2021   HGBA1C 6.6 (A) 09/24/2020   HGBA1C 6.6 06/18/2020     Lipid Panel ( most recent) Lipid Panel     Component Value Date/Time   CHOL 183 09/17/2020 1123   TRIG 104 09/17/2020 1123   HDL 75 09/17/2020 1123   CHOLHDL 2.4 09/17/2020 1123   CHOLHDL 2.2 07/23/2019 1010   VLDL 22 03/16/2016 0808   LDLCALC 90 09/17/2020 1123   LDLCALC 89 07/23/2019 1010   LABVLDL 18 09/17/2020 1123      Lab Results  Component Value Date   TSH 0.838 09/17/2020   TSH 0.98 06/19/2019   TSH 0.88 02/14/2019   TSH 0.96 01/27/2017   TSH 1.18 11/05/2015   TSH 1.603 07/10/2014   TSH 1.152 10/14/2012   TSH 1.202 10/14/2012   TSH 0.437 04/20/2011   TSH 1.274 04/06/2009   FREET4 1.36 09/17/2020   FREET4 1.2 06/19/2019   FREET4 1.2 02/14/2019     Assessment & Plan:   1. Type 2 diabetes mellitus with diabetic nephropathy, with long-term current use of insulin ([place])   - Isabella Matthews has currently uncontrolled symptomatic type 2 DM since  51 years of age.  Isabella Matthews presents with continued improvement in her glycemic profile with no hypoglycemia, point-of-care A1c of 6.3% overall improving from 10.5%.  Her most recent blood glucose average is between 147-160 for the last 30 days.  -Her recent labs were reviewed with her. - I had a long discussion with her about the progressive nature of diabetes and the pathology behind its complications. -her diabetes is complicated by neuropathy, obesity/sedentary life and she remains at a high risk for more acute and chronic complications which include CAD, CVA, CKD, retinopathy, and neuropathy. These are all discussed in detail with her.  - I have counseled her on diet  and weight management  by adopting a carbohydrate restricted/protein rich diet. Patient is encouraged to switch to  unprocessed or minimally processed     complex starch and increased  protein intake (animal or plant source), fruits, and vegetables. -  she is advised to stick to a routine mealtimes to eat 3 meals  a day and avoid unnecessary snacks ( to snack only to correct hypoglycemia).   - she acknowledges that there is a room for  improvement in her food and drink choices. - Suggestion is made for her to avoid simple carbohydrates  from her diet including Cakes, Sweet Desserts, Ice Cream, Soda (diet and regular), Sweet Tea, Candies, Chips, Cookies, Store Bought Juices, Alcohol , Artificial Sweeteners,  Coffee Creamer, and "Sugar-free" Products, Lemonade. This will help patient to have more stable blood glucose profile and potentially avoid unintended weight gain.  The following Lifestyle Medicine recommendations according to [facility]) were discussed and and offered to patient and she  agrees to start the journey:  A. Whole Foods, Plant-Based Nutrition comprising of fruits and vegetables, plant-based proteins, whole-grain carbohydrates was discussed in detail with the patient.   A list for source of those nutrients were also provided to the patient.  Patient will use only water or unsweetened tea for hydration. B.  The need to stay away from risky substances including alcohol, smoking; obtaining 7 to 9 hours of restorative sleep, at least 150 minutes of moderate intensity exercise weekly, the importance of healthy social connections,  and stress management techniques were discussed. C.  A full color page of  Calorie density of various food groups per pound showing examples of each food groups was provided to the patient.    - she will be scheduled with Jearld Fenton, RDN, CDE for diabetes education.  - I have approached her with the following individualized plan to manage  her diabetes and patient agrees:   -In light of her presentation with target A1c of 6.3%, she will not need prandial insulin for now.   -She is advised to continue  Tresiba 50 units nightly, continue Trulicity 3 mg subcutaneously weekly.  She tolerated Trulicity 4.5 mg subcutaneously weekly, however there is a shortage of this supply.  - she is warned not to take insulin without proper monitoring per orders. - she is encouraged to call clinic for blood glucose levels less than 70 or above 200 mg /dl.   -She reports GI intolerance from metformin.  - Specific targets for  A1c;  LDL, HDL, Triglycerides, and  Waist Circumference were discussed with the patient.  2) Blood Pressure /Hypertension:  -Her blood pressure is not controlled to target.  She did not take her medications this morning.   she is advised to continue her current medications including hydrochlorothiazide 25 mg p.o. daily, losartan 100 mg p.o. daily.  The above detailed lifestyle nutrition will help with hypertension and hyperlipidemia.  3) Lipids/Hyperlipidemia: Recent fasting lipid panel showed LDL of 90, triglycerides 104.  She is advised to continue atorvastatin 20 mg p.o. daily at bedtime.       Side effects and precautions discussed with her.  4)  Weight/Diet: Her BMI is 62.00- -   clearly complicating her diabetes care.   she is  a candidate for weight loss. I discussed with her the fact that loss of 5 - 10% of her  current body weight will have the most impact on her diabetes management.  Exercise, and detailed carbohydrates information provided  -  detailed on discharge instructions. -She may benefit from bariatric surgery, will be discussed in more detail on subsequent visit.  5) vitamin D deficiency She is on ongoing supplement with vitamin D3 5000 units daily.  She is now vitamin D replete at 34.  She is advised to continue.  6) Chronic Care/Health Maintenance:  -she  is on ACEI/ARB and Statin medications and  is encouraged to initiate and continue to follow up with  Ophthalmology, Dentist,  Podiatrist at least yearly or according to recommendations, and advised to   stay away  from smoking. I have recommended yearly flu vaccine and pneumonia vaccine at least every 5 years; moderate intensity exercise for up to 150 minutes weekly; and  sleep for at least 7 hours a day.  - she is  advised to maintain close follow up with Fayrene Helper, MD for primary care needs, as well as her other providers for optimal and coordinated care.   I spent 44 minutes in the care of the patient today including review of labs from [place], Lipids, Thyroid Function, Hematology (current and previous including abstractions from other facilities); face-to-face time discussing  her blood glucose readings/logs, discussing hypoglycemia and hyperglycemia episodes and symptoms, medications doses, her options of short and long term treatment based on the latest standards of care / guidelines;  discussion about incorporating lifestyle medicine;  and documenting the encounter.    Please refer to Patient Instructions for Blood Glucose Monitoring and Insulin/Medications Dosing Guide"  in media tab for additional information. Please  also refer to " Patient Self Inventory" in the Media  tab for reviewed elements of pertinent patient history.  Isabella Matthews participated in the discussions, expressed understanding, and voiced agreement with the above plans.  All questions were answered to her satisfaction. she is encouraged to contact clinic should she have any questions or concerns prior to her return visit.    Follow up plan: - Return in about 4 months (around 05/28/2021) for F/U with Pre-visit Labs, Meter, Logs, A1c here.Glade Lloyd, MD [facility] [address] [place], [place] [address] Phone: [phone]  Fax: [phone]    01/28/2021, 12:27 PM  This note was partially dictated with voice recognition software. Similar sounding words can be transcribed inadequately or may not  be corrected upon review.

## 2021-01-28 NOTE — Patient Instructions (Signed)

## 2021-02-10 ENCOUNTER — Other Ambulatory Visit: Payer: Self-pay | Admitting: "Endocrinology

## 2021-02-10 DIAGNOSIS — Z794 Long term (current) use of insulin: Secondary | ICD-10-CM

## 2021-02-10 DIAGNOSIS — E1121 Type 2 diabetes mellitus with diabetic nephropathy: Secondary | ICD-10-CM

## 2021-03-01 ENCOUNTER — Other Ambulatory Visit (HOSPITAL_COMMUNITY): Payer: Self-pay | Admitting: Family Medicine

## 2021-03-01 DIAGNOSIS — Z1231 Encounter for screening mammogram for malignant neoplasm of breast: Secondary | ICD-10-CM

## 2021-03-03 ENCOUNTER — Encounter: Payer: Self-pay | Admitting: Family Medicine

## 2021-03-03 ENCOUNTER — Ambulatory Visit: Payer: No Typology Code available for payment source | Admitting: Family Medicine

## 2021-03-03 ENCOUNTER — Other Ambulatory Visit: Payer: Self-pay

## 2021-03-03 VITALS — BP 140/92 | HR 92 | Ht 60.0 in | Wt 345.0 lb

## 2021-03-03 DIAGNOSIS — E1159 Type 2 diabetes mellitus with other circulatory complications: Secondary | ICD-10-CM

## 2021-03-03 DIAGNOSIS — Z6841 Body Mass Index (BMI) 40.0 and over, adult: Secondary | ICD-10-CM

## 2021-03-03 DIAGNOSIS — E1121 Type 2 diabetes mellitus with diabetic nephropathy: Secondary | ICD-10-CM

## 2021-03-03 DIAGNOSIS — Z794 Long term (current) use of insulin: Secondary | ICD-10-CM

## 2021-03-03 DIAGNOSIS — I152 Hypertension secondary to endocrine disorders: Secondary | ICD-10-CM

## 2021-03-03 DIAGNOSIS — I10 Essential (primary) hypertension: Secondary | ICD-10-CM | POA: Diagnosis not present

## 2021-03-03 DIAGNOSIS — Z23 Encounter for immunization: Secondary | ICD-10-CM | POA: Diagnosis not present

## 2021-03-03 DIAGNOSIS — E782 Mixed hyperlipidemia: Secondary | ICD-10-CM

## 2021-03-03 DIAGNOSIS — B369 Superficial mycosis, unspecified: Secondary | ICD-10-CM

## 2021-03-03 MED ORDER — AMLODIPINE BESYLATE 2.5 MG PO TABS: 2.5000 mg | ORAL_TABLET | Freq: Every day | ORAL | 1 refills | Status: DC

## 2021-03-03 MED ORDER — ASPIRIN 81 MG PO TBEC: 81.0000 mg | DELAYED_RELEASE_TABLET | Freq: Every day | ORAL | 3 refills | Status: DC

## 2021-03-03 NOTE — Assessment & Plan Note (Signed)
Hyperlipidemia:Low fat diet discussed and encouraged.   Lipid Panel  Lab Results  Component Value Date   CHOL 183 09/17/2020   HDL 75 09/17/2020   LDLCALC 90 09/17/2020   TRIG 104 09/17/2020   CHOLHDL 2.4 09/17/2020     Updated lab needed

## 2021-03-03 NOTE — Patient Instructions (Signed)
F/U in 8 to 10 weeks, call if you need me sooner  Flu vaccine today  LIpid, cmp and EGFr, magnesium today  New additional medication for bP is amlodipine 2.5 mg daily  Check and record BP every morning, goal is 130/80 or less  Work on cutting calories , increasing vegetables so you lose weight   CONGRATS on excellent blood sugar  Thanks for choosing [facility], we consider it a privelige to serve you.

## 2021-03-03 NOTE — Assessment & Plan Note (Signed)
°  Patient re-educated about  the importance of commitment to a  minimum of 150 minutes of exercise per week as able.  The importance of healthy food choices with portion control discussed, as well as eating regularly and within a 12 hour window most days. The need to choose "clean , green" food 50 to 75% of the time is discussed, as well as to make water the primary drink and set a goal of 64 ounces water daily.    Weight /BMI 03/03/2021 01/28/2021 09/24/2020  WEIGHT 345 lb 0.6 oz 350 lb 335 lb 12.8 oz  HEIGHT 5\' 0"  5\' 3"  5\' 3"   BMI 67.39 kg/m2 62 kg/m2 59.48 kg/m2

## 2021-03-03 NOTE — Progress Notes (Signed)
Isabella Matthews     MRN: [id number]      DOB: 1970-11-03   HPI Isabella Matthews is here for follow up and re-evaluation of chronic medical conditions, medication management and review of any available recent lab and radiology data.  Preventive health is updated, specifically  Cancer screening and Immunization.   Questions or concerns regarding consultations or procedures which the PT has had in the interim are  addressed. The PT denies any adverse reactions to current medications since the last visit.  There are no new concerns.  There are no specific complaints  Denies polyuria, polydipsia, blurred vision , or hypoglycemic episodes.   ROS Denies recent fever or chills. Denies sinus pressure, nasal congestion, ear pain or sore throat. Denies chest congestion, productive cough or wheezing. Denies chest pains, palpitations and leg swelling Denies abdominal pain, nausea, vomiting,diarrhea or constipation.   Denies dysuria, frequency, hesitancy or incontinence. Denies  uncontrolled joint pain, swelling and limitation in mobility. Denies headaches, seizures, numbness, or tingling. Denies depression, anxiety or insomnia. Denies skin break down or rash.   PE  BP (!) 140/92    Pulse 92    Ht 5' (1.524 m)    Wt (!) 345 lb 0.6 oz (156.5 kg)    SpO2 94%    BMI 67.39 kg/m   Patient alert and oriented and in no cardiopulmonary distress.  HEENT: No facial asymmetry, EOMI,     Neck supple .  Chest: Clear to auscultation bilaterally.  CVS: S1, S2 no murmurs, no S3.Regular rate.  ABD: Soft non tender.   Ext: No edema  MS: Adequate  though reduced ROM spine,  hips and knees.  Skin: Intact, no ulcerations or rash noted.  Psych: Good eye contact, normal affect. Memory intact not anxious or depressed appearing.  CNS: CN 2-12 intact, power,  normal throughout.no focal deficits noted.   Assessment & Plan  Hypertension associated with type 2 diabetes mellitus (HCC) Not at goal, add  amlodipine 2.5 mg DASH diet and commitment to daily physical activity for a minimum of 30 minutes discussed and encouraged, as a part of hypertension management. The importance of attaining a healthy weight is also discussed.  BP/Weight 03/03/2021 01/28/2021 09/24/2020 09/08/2020 06/18/2020 02/17/2020 02/09/2020  Systolic BP 140 146 141 137 126 142 125  Diastolic BP 92 92 83 82 78 88 72  Wt. (Lbs) 345.04 350 335.8 343.04 334 340.4 321  BMI 67.39 62 59.48 60.77 59.17 60.3 56.86       Morbid obesity with BMI of 50.0-59.9, adult (HCC)  Patient re-educated about  the importance of commitment to a  minimum of 150 minutes of exercise per week as able.  The importance of healthy food choices with portion control discussed, as well as eating regularly and within a 12 hour window most days. The need to choose "clean , green" food 50 to 75% of the time is discussed, as well as to make water the primary drink and set a goal of 64 ounces water daily.    Weight /BMI 03/03/2021 01/28/2021 09/24/2020  WEIGHT 345 lb 0.6 oz 350 lb 335 lb 12.8 oz  HEIGHT 5\' 0"  5\' 3"  5\' 3"   BMI 67.39 kg/m2 62 kg/m2 59.48 kg/m2      Type 2 diabetes mellitus with diabetic nephropathy, with long-term current use of insulin (HCC) Isabella Matthews is reminded of the importance of commitment to daily physical activity for 30 minutes or more, as able and the need to limit carbohydrate intake  to 30 to 60 grams per meal to help with blood sugar control.   The need to take medication as prescribed, test blood sugar as directed, and to call between visits if there is a concern that blood sugar is uncontrolled is also discussed.   Isabella Matthews is reminded of the importance of daily foot exam, annual eye examination, and good blood sugar, blood pressure and cholesterol control.  Controlled, no change in medication Managed by Endo Diabetic Labs Latest Ref Rng & Units 01/28/2021 09/24/2020 09/17/2020 09/08/2020 06/18/2020  HbA1c 0.0 - 7.0 % 6.3 6.6(A)  - - 6.6  Microalbumin Not Estab. ug/mL - - - - -  Micro/Creat Ratio 0 - 29 mg/g creat - - - 10 -  Chol 100 - 199 mg/dL - - 401 - -  HDL >02 mg/dL - - 75 - -  Calc LDL 0 - 99 mg/dL - - 90 - -  Triglycerides 0 - 149 mg/dL - - 725 - -  Creatinine 0.57 - 1.00 mg/dL - - 3.66 - -   BP/Weight 03/03/2021 01/28/2021 09/24/2020 09/08/2020 06/18/2020 02/17/2020 02/09/2020  Systolic BP 140 146 141 137 126 142 125  Diastolic BP 92 92 83 82 78 88 72  Wt. (Lbs) 345.04 350 335.8 343.04 334 340.4 321  BMI 67.39 62 59.48 60.77 59.17 60.3 56.86   Foot/eye exam completion dates Latest Ref Rng & Units 01/20/2020 10/08/2019  Eye Exam No Retinopathy No Retinopathy -  Foot exam Order - - -  Foot Form Completion - - Done        Mixed hyperlipidemia Hyperlipidemia:Low fat diet discussed and encouraged.   Lipid Panel  Lab Results  Component Value Date   CHOL 183 09/17/2020   HDL 75 09/17/2020   LDLCALC 90 09/17/2020   TRIG 104 09/17/2020   CHOLHDL 2.4 09/17/2020     Updated lab needed   Dermatomycosis No current flare has med for as needed use

## 2021-03-03 NOTE — Assessment & Plan Note (Signed)
Isabella Matthews is reminded of the importance of commitment to daily physical activity for 30 minutes or more, as able and the need to limit carbohydrate intake to 30 to 60 grams per meal to help with blood sugar control.   The need to take medication as prescribed, test blood sugar as directed, and to call between visits if there is a concern that blood sugar is uncontrolled is also discussed.   Isabella Matthews is reminded of the importance of daily foot exam, annual eye examination, and good blood sugar, blood pressure and cholesterol control.  Controlled, no change in medication Managed by Endo Diabetic Labs Latest Ref Rng & Units 01/28/2021 09/24/2020 09/17/2020 09/08/2020 06/18/2020  HbA1c 0.0 - 7.0 % 6.3 6.6(A) - - 6.6  Microalbumin Not Estab. ug/mL - - - - -  Micro/Creat Ratio 0 - 29 mg/g creat - - - 10 -  Chol 100 - 199 mg/dL - - 917 - -  HDL >91 mg/dL - - 75 - -  Calc LDL 0 - 99 mg/dL - - 90 - -  Triglycerides 0 - 149 mg/dL - - 505 - -  Creatinine 0.57 - 1.00 mg/dL - - 6.97 - -   BP/Weight 03/03/2021 01/28/2021 09/24/2020 09/08/2020 06/18/2020 02/17/2020 02/09/2020  Systolic BP 140 146 141 137 126 142 125  Diastolic BP 92 92 83 82 78 88 72  Wt. (Lbs) 345.04 350 335.8 343.04 334 340.4 321  BMI 67.39 62 59.48 60.77 59.17 60.3 56.86   Foot/eye exam completion dates Latest Ref Rng & Units 01/20/2020 10/08/2019  Eye Exam No Retinopathy No Retinopathy -  Foot exam Order - - -  Foot Form Completion - - Done

## 2021-03-03 NOTE — Assessment & Plan Note (Signed)
Not at goal, add amlodipine 2.5 mg DASH diet and commitment to daily physical activity for a minimum of 30 minutes discussed and encouraged, as a part of hypertension management. The importance of attaining a healthy weight is also discussed.  BP/Weight 03/03/2021 01/28/2021 09/24/2020 09/08/2020 06/18/2020 02/17/2020 02/09/2020  Systolic BP 140 146 141 137 126 142 125  Diastolic BP 92 92 83 82 78 88 72  Wt. (Lbs) 345.04 350 335.8 343.04 334 340.4 321  BMI 67.39 62 59.48 60.77 59.17 60.3 56.86

## 2021-03-03 NOTE — Assessment & Plan Note (Signed)
No current flare has med for as needed use

## 2021-03-05 LAB — CMP14+EGFR
ALT: 23 IU/L (ref 0–32)
AST: 42 IU/L — ABNORMAL HIGH (ref 0–40)
Albumin/Globulin Ratio: 1.5 (ref 1.2–2.2)
Albumin: 4.8 g/dL (ref 3.8–4.9)
Alkaline Phosphatase: 83 IU/L (ref 44–121)
BUN/Creatinine Ratio: 22 (ref 9–23)
BUN: 23 mg/dL (ref 6–24)
Bilirubin Total: 0.6 mg/dL (ref 0.0–1.2)
CO2: 20 mmol/L (ref 20–29)
Calcium: 9.8 mg/dL (ref 8.7–10.2)
Chloride: 97 mmol/L (ref 96–106)
Creatinine, Ser: 1.05 mg/dL — ABNORMAL HIGH (ref 0.57–1.00)
Globulin, Total: 3.3 g/dL (ref 1.5–4.5)
Glucose: 105 mg/dL — ABNORMAL HIGH (ref 70–99)
Potassium: 4.6 mmol/L (ref 3.5–5.2)
Sodium: 142 mmol/L (ref 134–144)
Total Protein: 8.1 g/dL (ref 6.0–8.5)
eGFR: 64 mL/min/{1.73_m2} (ref >59)

## 2021-03-05 LAB — LIPID PANEL
Chol/HDL Ratio: 2.6 ratio (ref 0.0–4.4)
Cholesterol, Total: 213 mg/dL — ABNORMAL HIGH (ref 100–199)
HDL: 83 mg/dL (ref >39)
LDL Chol Calc (NIH): 110 mg/dL — ABNORMAL HIGH (ref 0–99)
Triglycerides: 116 mg/dL (ref 0–149)
VLDL Cholesterol Cal: 20 mg/dL (ref 5–40)

## 2021-03-05 LAB — MAGNESIUM: Magnesium: 2 mg/dL (ref 1.6–2.3)

## 2021-03-06 ENCOUNTER — Other Ambulatory Visit: Payer: Self-pay | Admitting: Family Medicine

## 2021-03-06 MED ORDER — ROSUVASTATIN CALCIUM 20 MG PO TABS: 20.0000 mg | ORAL_TABLET | Freq: Every day | ORAL | 3 refills | Status: DC

## 2021-03-06 NOTE — Progress Notes (Signed)
D/c pravastatin, new is rosuvastatin

## 2021-03-07 ENCOUNTER — Telehealth: Payer: Self-pay

## 2021-03-07 NOTE — Telephone Encounter (Signed)
Patient returning lab results call 

## 2021-03-09 NOTE — Telephone Encounter (Signed)
LMTRC

## 2021-04-04 ENCOUNTER — Ambulatory Visit (HOSPITAL_COMMUNITY): Payer: No Typology Code available for payment source

## 2021-04-06 ENCOUNTER — Ambulatory Visit (HOSPITAL_COMMUNITY)
Admission: RE | Admit: 2021-04-06 | Discharge: 2021-04-06 | Disposition: A | Discharge status: Home / Self Care | Payer: No Typology Code available for payment source | Source: Ambulatory Visit | Attending: Family Medicine | Admitting: Family Medicine

## 2021-04-06 ENCOUNTER — Other Ambulatory Visit: Payer: Self-pay

## 2021-04-06 DIAGNOSIS — Z1231 Encounter for screening mammogram for malignant neoplasm of breast: Secondary | ICD-10-CM | POA: Diagnosis present

## 2021-05-05 ENCOUNTER — Other Ambulatory Visit: Payer: Self-pay | Admitting: "Endocrinology

## 2021-05-05 ENCOUNTER — Ambulatory Visit: Payer: No Typology Code available for payment source | Admitting: Family Medicine

## 2021-05-17 ENCOUNTER — Ambulatory Visit: Payer: No Typology Code available for payment source | Admitting: Family Medicine

## 2021-05-19 ENCOUNTER — Encounter: Payer: Self-pay | Admitting: Family Medicine

## 2021-05-19 ENCOUNTER — Ambulatory Visit: Payer: No Typology Code available for payment source | Admitting: Family Medicine

## 2021-05-19 VITALS — BP 124/78 | HR 87 | Resp 16 | Ht 63.0 in | Wt 352.1 lb

## 2021-05-19 DIAGNOSIS — E559 Vitamin D deficiency, unspecified: Secondary | ICD-10-CM | POA: Diagnosis not present

## 2021-05-19 DIAGNOSIS — E1121 Type 2 diabetes mellitus with diabetic nephropathy: Secondary | ICD-10-CM

## 2021-05-19 DIAGNOSIS — Z6841 Body Mass Index (BMI) 40.0 and over, adult: Secondary | ICD-10-CM

## 2021-05-19 DIAGNOSIS — E1169 Type 2 diabetes mellitus with other specified complication: Secondary | ICD-10-CM

## 2021-05-19 DIAGNOSIS — Z794 Long term (current) use of insulin: Secondary | ICD-10-CM

## 2021-05-19 DIAGNOSIS — I10 Essential (primary) hypertension: Secondary | ICD-10-CM

## 2021-05-19 DIAGNOSIS — E785 Hyperlipidemia, unspecified: Secondary | ICD-10-CM

## 2021-05-19 MED ORDER — ROSUVASTATIN CALCIUM 20 MG PO TABS: 20.0000 mg | ORAL_TABLET | Freq: Every day | ORAL | 3 refills | Status: DC

## 2021-05-19 NOTE — Progress Notes (Signed)
? ?[name]     MRN: [id number]      DOB: 1970/12/07 ? ? ?HPI ?Isabella Matthews is here for follow up and re-evaluation of chronic medical conditions, medication management and review of any available recent lab and radiology data.  ?Preventive health is updated, specifically  Cancer screening and Immunization.   ?Questions or concerns regarding consultations or procedures which the PT has had in the interim are  addressed. ?The PT denies any adverse reactions to current medications since the last visit.  ?There are no new concerns.  ?There are no specific complaints  ?Denies polyuria, polydipsia, blurred vision , or hypoglycemic episodes. ? ? ?ROS ?Denies recent fever or chills. ?Denies sinus pressure, nasal congestion, ear pain or sore throat. ?Denies chest congestion, productive cough or wheezing. ?Denies chest pains, palpitations and leg swelling ?Denies abdominal pain, nausea, vomiting,diarrhea or constipation.   ?Denies dysuria, frequency, hesitancy or incontinence. ?Denies uncontrolled joint pain, swelling and limitation in mobility. ?Denies headaches, seizures, numbness, or tingling. ?Denies depression, anxiety or insomnia. ?Denies skin break down or rash. ? ? ?PE ? ?BP 124/78   Pulse 87   Resp 16   Ht 5\' 3"  (1.6 m)   Wt (!) 352 lb 1.9 oz (159.7 kg)   LMP  (LMP Unknown)   SpO2 94%   BMI 62.38 kg/m?  ? ?Patient alert and oriented and in no cardiopulmonary distress. ? ?HEENT: No facial asymmetry, EOMI,     Neck supple . ? ?Chest: Clear to auscultation bilaterally. ? ?CVS: S1, S2 no murmurs, no S3.Regular rate. ? ?ABD: Soft non tender.  ? ?Ext: No edema ? ?MS: Adequate though reduced  ROM spine, shoulders, hips and knees. ? ?Skin: Intact, no ulcerations or rash noted. ? ?Psych: Good eye contact, normal affect. Memory intact not anxious or depressed appearing. ? ?CNS: CN 2-12 intact, power,  normal throughout.no focal deficits noted. ? ? ?Assessment & Plan ? ?Morbid obesity with BMI of 50.0-59.9,  adult (HCC) ? ?Patient re-educated about  the importance of commitment to a  minimum of 150 minutes of exercise per week as able. ? ?The importance of healthy food choices with portion control discussed, as well as eating regularly and within a 12 hour window most days. ?The need to choose "clean , green" food 50 to 75% of the time is discussed, as well as to make water the primary drink and set a goal of 64 ounces water daily. ? ?  ? ?  05/19/2021  ?  3:11 PM 03/03/2021  ?  3:47 PM 01/28/2021  ?  9:34 AM  ?Weight /BMI  ?Weight 352 lb 1.9 oz 345 lb 0.6 oz 350 lb  ?Height 5\' 3"  (1.6 m) 5' (1.524 m) 5\' 3"  (1.6 m)  ?BMI 62.38 kg/m2 67.39 kg/m2 62 kg/m2  ? ? ? ? ?Essential hypertension, benign ?Controlled, no change in medication ?DASH diet and commitment to daily physical activity for a minimum of 30 minutes discussed and encouraged, as a part of hypertension management. ?The importance of attaining a healthy weight is also discussed. ? ? ?  05/19/2021  ?  3:39 PM 05/19/2021  ?  3:11 PM 03/03/2021  ?  4:29 PM 03/03/2021  ?  3:47 PM 01/28/2021  ?  9:34 AM 09/24/2020  ?  9:02 AM 09/08/2020  ?  3:20 PM  ?BP/Weight  ?Systolic BP 124 153 140 171 146 141 137  ?Diastolic BP 78 89 92 104 92 83 82  ?Wt. (Lbs)  352.12  345.04 350 335.8 343.04  ?BMI  62.38 kg/m2  67.39 kg/m2 62 kg/m2 59.48 kg/m2 60.77 kg/m2  ? ? ? ? ? ?Hyperlipidemia associated with type 2 diabetes mellitus (HCC) ?Hyperlipidemia:Low fat diet discussed and encouraged. ? ? ?Lipid Panel  ?Lab Results  ?Component Value Date  ? CHOL 213 (H) 03/03/2021  ? HDL 83 03/03/2021  ? LDLCALC 110 (H) 03/03/2021  ? TRIG 116 03/03/2021  ? CHOLHDL 2.6 03/03/2021  ? ? ? ?Needs to start crestor prescribed seveal weeks ago, still has not received from mail order, will re send ? ?

## 2021-05-19 NOTE — Patient Instructions (Addendum)
F/U in mid September, call if you need me sooner ? ?PLEASE follow up and start rosuvastatin in place of pravastatin for your cholesterol and reduce fried and fatty foods ? ? ?,Fasting lipid, cmp and EGFR, CBC, , TSH and vit D and microalb 5 to 7 days before September appointment ? ?It is important that you exercise regularly at least 30 minutes 5 times a week. If you develop chest pain, have severe difficulty breathing, or feel very tired, stop exercising immediately and seek medical attention  ? ? ?Think about what you will eat, plan ahead. ?Choose " clean, green, fresh or frozen" over canned, processed or packaged foods which are more sugary, salty and fatty. ?70 to 75% of food eaten should be vegetables and fruit. ?Three meals at set times with snacks allowed between meals, but they must be fruit or vegetables. ?Aim to eat over a 12 hour period , example 7 am to 7 pm, and STOP after  your last meal of the day. ?Drink water,generally about 64 ounces per day, no other drink is as healthy. Fruit juice is best enjoyed in a healthy way, by EATING the fruit. ? ?Thanks for choosing [facility], we consider it a privelige to serve you. ? ?

## 2021-05-19 NOTE — Assessment & Plan Note (Signed)
Hyperlipidemia:Low fat diet discussed and encouraged. ? ? ?Lipid Panel  ?Lab Results  ?Component Value Date  ? CHOL 213 (H) 03/03/2021  ? HDL 83 03/03/2021  ? LDLCALC 110 (H) 03/03/2021  ? TRIG 116 03/03/2021  ? CHOLHDL 2.6 03/03/2021  ? ? ? ?Needs to start crestor prescribed seveal weeks ago, still has not received from mail order, will re send ?

## 2021-05-19 NOTE — Assessment & Plan Note (Signed)
?  Patient re-educated about  the importance of commitment to a  minimum of 150 minutes of exercise per week as able. ? ?The importance of healthy food choices with portion control discussed, as well as eating regularly and within a 12 hour window most days. ?The need to choose "clean , green" food 50 to 75% of the time is discussed, as well as to make water the primary drink and set a goal of 64 ounces water daily. ? ?  ? ?  05/19/2021  ?  3:11 PM 03/03/2021  ?  3:47 PM 01/28/2021  ?  9:34 AM  ?Weight /BMI  ?Weight 352 lb 1.9 oz 345 lb 0.6 oz 350 lb  ?Height 5\' 3"  (1.6 m) 5' (1.524 m) 5\' 3"  (1.6 m)  ?BMI 62.38 kg/m2 67.39 kg/m2 62 kg/m2  ? ? ? ?

## 2021-05-19 NOTE — Assessment & Plan Note (Signed)
Controlled, no change in medication ?DASH diet and commitment to daily physical activity for a minimum of 30 minutes discussed and encouraged, as a part of hypertension management. ?The importance of attaining a healthy weight is also discussed. ? ? ?  05/19/2021  ?  3:39 PM 05/19/2021  ?  3:11 PM 03/03/2021  ?  4:29 PM 03/03/2021  ?  3:47 PM 01/28/2021  ?  9:34 AM 09/24/2020  ?  9:02 AM 09/08/2020  ?  3:20 PM  ?BP/Weight  ?Systolic BP 124 153 140 171 146 141 137  ?Diastolic BP 78 89 92 104 92 83 82  ?Wt. (Lbs)  352.12  345.04 350 335.8 343.04  ?BMI  62.38 kg/m2  67.39 kg/m2 62 kg/m2 59.48 kg/m2 60.77 kg/m2  ? ? ? ? ?

## 2021-05-24 LAB — COMPREHENSIVE METABOLIC PANEL
ALT: 16 IU/L (ref 0–32)
AST: 20 IU/L (ref 0–40)
Albumin/Globulin Ratio: 1.3 (ref 1.2–2.2)
Albumin: 4.5 g/dL (ref 3.8–4.9)
Alkaline Phosphatase: 87 IU/L (ref 44–121)
BUN/Creatinine Ratio: 18 (ref 9–23)
BUN: 18 mg/dL (ref 6–24)
Bilirubin Total: 0.4 mg/dL (ref 0.0–1.2)
CO2: 26 mmol/L (ref 20–29)
Calcium: 10.4 mg/dL — ABNORMAL HIGH (ref 8.7–10.2)
Chloride: 101 mmol/L (ref 96–106)
Creatinine, Ser: 0.99 mg/dL (ref 0.57–1.00)
Globulin, Total: 3.4 g/dL (ref 1.5–4.5)
Glucose: 122 mg/dL — ABNORMAL HIGH (ref 70–99)
Potassium: 4.4 mmol/L (ref 3.5–5.2)
Sodium: 142 mmol/L (ref 134–144)
Total Protein: 7.9 g/dL (ref 6.0–8.5)
eGFR: 69 mL/min/{1.73_m2} (ref >59)

## 2021-05-31 ENCOUNTER — Ambulatory Visit (INDEPENDENT_AMBULATORY_CARE_PROVIDER_SITE_OTHER): Payer: No Typology Code available for payment source | Admitting: "Endocrinology

## 2021-05-31 ENCOUNTER — Encounter: Payer: Self-pay | Admitting: "Endocrinology

## 2021-05-31 VITALS — BP 130/88 | HR 76 | Ht 63.0 in | Wt 349.6 lb

## 2021-05-31 DIAGNOSIS — Z794 Long term (current) use of insulin: Secondary | ICD-10-CM

## 2021-05-31 DIAGNOSIS — I10 Essential (primary) hypertension: Secondary | ICD-10-CM | POA: Diagnosis not present

## 2021-05-31 DIAGNOSIS — E782 Mixed hyperlipidemia: Secondary | ICD-10-CM

## 2021-05-31 DIAGNOSIS — E559 Vitamin D deficiency, unspecified: Secondary | ICD-10-CM | POA: Diagnosis not present

## 2021-05-31 DIAGNOSIS — E1121 Type 2 diabetes mellitus with diabetic nephropathy: Secondary | ICD-10-CM

## 2021-05-31 LAB — POCT GLYCOSYLATED HEMOGLOBIN (HGB A1C): HbA1c, POC (controlled diabetic range): 6.9 % (ref 0.0–7.0)

## 2021-05-31 MED ORDER — TRULICITY 4.5 MG/0.5ML ~~LOC~~ SOAJ: 4.5000 mg | SUBCUTANEOUS | 2 refills | Status: DC

## 2021-05-31 NOTE — Patient Instructions (Signed)

## 2021-05-31 NOTE — Progress Notes (Signed)
05/31/2021, 9:56 AM                                 Endocrinology follow-up note   Subjective:    Patient ID: Isabella Matthews, female    DOB: 1970-08-20.  Isabella Matthews is being seen in follow-up for the management of currently controlled type 2 diabetes, hyperlipidemia, hypertension. PMD: Kerri Perches, MD.   Past Medical History:  Diagnosis Date   Aortic stenosis, mild    Aortic valve disorders    Diabetes mellitus    Diabetes mellitus without complication (HCC)    Phreesia 02/08/2020   Edema    Essential hypertension    Hypertension    Phreesia 02/08/2020   Morbid obesity (HCC)    Onychomycosis of toenail     Past Surgical History:  Procedure Laterality Date   CESAREAN SECTION     x2    Social History   Socioeconomic History   Marital status: Single    Spouse name: Not on file   Number of children: Not on file   Years of education: Not on file   Highest education level: Not on file  Occupational History   Occupation: CNA - [employer]   Tobacco Use   Smoking status: Former    Packs/day: 0.50    Types: Cigarettes    Quit date: 11/23/2014    Years since quitting: 6.5   Smokeless tobacco: Never  Vaping Use   Vaping Use: Never used  Substance and Sexual Activity   Alcohol use: Yes    Alcohol/week: 0.0 standard drinks    Comment: occasionally   Drug use: No   Sexual activity: Not on file  Other Topics Concern   Not on file  Social History Narrative   Not on file   Social Determinants of Health   Financial Resource Strain: Not on file  Food Insecurity: Not on file  Transportation Needs: Not on file  Physical Activity: Not on file  Stress: Not on file  Social Connections: Not on file    Family History  Problem Relation Age of Onset   Hypertension Mother    Hypertension Father    Diabetes Father     Outpatient Encounter Medications as of 05/31/2021  Medication Sig   Dulaglutide (TRULICITY) 4.5 MG/0.5ML SOPN  Inject 4.5 mg as directed once a week.   amLODipine (NORVASC) 2.5 MG tablet Take 1 tablet (2.5 mg total) by mouth daily.   aspirin 81 MG EC tablet Take 1 tablet (81 mg total) by mouth daily. Swallow whole.   Cholecalciferol (VITAMIN D3) 125 MCG (5000 UT) CAPS Take 1 capsule (5,000 Units total) by mouth daily.   gabapentin (NEURONTIN) 300 MG capsule TAKE 1 CAPSULE (300 MG TOTAL) BY MOUTH AT BEDTIME.   glucose blood test strip 1 each by Other route 2 (two) times daily. Use as instructed   hydrochlorothiazide (HYDRODIURIL) 25 MG tablet TAKE 1 TABLET (25 MG TOTAL) BY MOUTH DAILY.   losartan (COZAAR) 100 MG tablet TAKE 1 TABLET (100 MG TOTAL) BY MOUTH DAILY.   Multiple Vitamin (MULTIVITAMIN) tablet Take 1 tablet by mouth daily.   nystatin (MYCOSTATIN/NYSTOP) powder Apply topically as needed (for rash/irritation).   rosuvastatin (CRESTOR) 20 MG tablet Take 1 tablet (20 mg total) by mouth daily.   TRESIBA FLEXTOUCH 200 UNIT/ML FlexTouch Pen INJECT 50 UNITS INTO THE SKIN AT BEDTIME.   [DISCONTINUED]  TRULICITY 3 MG/0.5ML SOPN INJECT 3MG  SUBCUTANEOUSLY ONCE WEEKLY AS DIRECTED   No facility-administered encounter medications on file as of 05/31/2021.    ALLERGIES: Allergies  Allergen Reactions   Penicillins Swelling    Has patient had a PCN reaction causing immediate rash, facial/tongue/throat swelling, SOB or lightheadedness with hypotension: Yes Has patient had a PCN reaction causing severe rash involving mucus membranes or skin necrosis: No Has patient had a PCN reaction that required hospitalization: No Has patient had a PCN reaction occurring within the last 10 years: Yes If all of the above answers are "NO", then may proceed with Cephalosporin use. Leg swelling/redness   Ace Inhibitors Cough   Metformin And Related Nausea Only   Tramadol Rash    Scant rash on hands  After starting tramadol for knee pain in Jan 16, 2013    VACCINATION STATUS: Immunization History  Administered Date(s)  Administered   Influenza Split 10/27/2013   Influenza Whole 12/29/2005, 09/29/2009   Influenza,inj,Quad PF,6+ Mos 10/17/2012, 10/24/2016, 10/01/2018, 10/08/2019, 03/03/2021   Moderna Sars-Covid-2 Vaccination 01/28/2019, 12/16/2019, 07/22/2020   Pneumococcal Polysaccharide-23 01/12/2006, 07/09/2015   Tdap 08/12/2010, 09/08/2020   Zoster Recombinat (Shingrix) 07/23/2019, 10/08/2019    Diabetes She presents for her follow-up diabetic visit. She has type 2 diabetes mellitus. Onset time: She was diagnosed at approximate age of 51 years, she did have an episode of gestational diabetes. Her disease course has been worsening. There are no hypoglycemic associated symptoms. Pertinent negatives for hypoglycemia include no confusion, headaches, pallor or seizures. Pertinent negatives for diabetes include no chest pain, no fatigue, no polydipsia, no polyphagia and no polyuria. There are no hypoglycemic complications. Symptoms are worsening. Diabetic complications include peripheral neuropathy. Risk factors for coronary artery disease include diabetes mellitus, dyslipidemia, family history, hypertension, obesity and sedentary lifestyle. Current diabetic treatment includes insulin injections and oral agent (monotherapy). She is compliant with treatment most of the time. Her weight is increasing steadily. She is following a generally unhealthy diet. When asked about meal planning, she reported none. She has not had a previous visit with a dietitian. She rarely participates in exercise. Her home blood glucose trend is increasing steadily. Her breakfast blood glucose range is generally 110-130 mg/dl. Her bedtime blood glucose range is generally 140-180 mg/dl. Her overall blood glucose range is 140-180 mg/dl. Isabella Matthews presents with slightly increasing glycemic profile, point-of-care A1c of 6.9% increasing from 6.3% from her last visit.  However, overall improving from 10.3%.  She did not document any hypoglycemia.  ) An ACE  inhibitor/angiotensin II receptor blocker is being taken. Eye exam is current.  Hyperlipidemia This is a chronic problem. The current episode started more than 1 year ago. The problem is controlled. Exacerbating diseases include diabetes and obesity. Pertinent negatives include no chest pain, myalgias or shortness of breath. Current antihyperlipidemic treatment includes statins. Risk factors for coronary artery disease include dyslipidemia, family history, obesity, hypertension, a sedentary lifestyle and diabetes mellitus.  Hypertension This is a chronic problem. The current episode started more than 1 year ago. Pertinent negatives include no chest pain, headaches, palpitations or shortness of breath. Risk factors for coronary artery disease include diabetes mellitus, dyslipidemia, obesity and sedentary lifestyle. Past treatments include angiotensin blockers.    Review of systems: Limited as above.  Objective:    BP 130/88   Pulse 76   Ht 5\' 3"  (1.6 m)   Wt (!) 349 lb 9.6 oz (158.6 kg)   LMP  (LMP Unknown)   BMI 61.93 kg/m  Wt Readings from Last 3 Encounters:  05/31/21 (!) 349 lb 9.6 oz (158.6 kg)  05/19/21 (!) 352 lb 1.9 oz (159.7 kg)  03/03/21 (!) 345 lb 0.6 oz (156.5 kg)     Physical Exam- Limited  Constitutional:  Body mass index is 61.93 kg/m. , not in acute distress, normal state of mind    CMP ( most recent) CMP     Component Value Date/Time   NA 142 05/23/2021 1522   K 4.4 05/23/2021 1522   CL 101 05/23/2021 1522   CO2 26 05/23/2021 1522   GLUCOSE 122 (H) 05/23/2021 1522   GLUCOSE 138 (H) 06/19/2019 0721   BUN 18 05/23/2021 1522   CREATININE 0.99 05/23/2021 1522   CREATININE 0.87 06/19/2019 0721   CALCIUM 10.4 (H) 05/23/2021 1522   PROT 7.9 05/23/2021 1522   ALBUMIN 4.5 05/23/2021 1522   AST 20 05/23/2021 1522   ALT 16 05/23/2021 1522   ALKPHOS 87 05/23/2021 1522   BILITOT 0.4 05/23/2021 1522   GFRNONAA 70 02/02/2020 1128   GFRNONAA 101 02/14/2019 0848    GFRAA 81 02/02/2020 1128   GFRAA 117 02/14/2019 0848     Diabetic Labs (most recent): Lab Results  Component Value Date   HGBA1C 6.9 05/31/2021   HGBA1C 6.3 01/28/2021   HGBA1C 6.6 (A) 09/24/2020     Lipid Panel ( most recent) Lipid Panel     Component Value Date/Time   CHOL 213 (H) 03/03/2021 1641   TRIG 116 03/03/2021 1641   HDL 83 03/03/2021 1641   CHOLHDL 2.6 03/03/2021 1641   CHOLHDL 2.2 07/23/2019 1010   VLDL 22 03/16/2016 0808   LDLCALC 110 (H) 03/03/2021 1641   LDLCALC 89 07/23/2019 1010   LABVLDL 20 03/03/2021 1641      Lab Results  Component Value Date   TSH 0.838 09/17/2020   TSH 0.98 06/19/2019   TSH 0.88 02/14/2019   TSH 0.96 01/27/2017   TSH 1.18 11/05/2015   TSH 1.603 07/10/2014   TSH 1.152 10/14/2012   TSH 1.202 10/14/2012   TSH 0.437 04/20/2011   TSH 1.274 04/06/2009   FREET4 1.36 09/17/2020   FREET4 1.2 06/19/2019   FREET4 1.2 02/14/2019     Assessment & Plan:   1. Type 2 diabetes mellitus with diabetic nephropathy, with long-term current use of insulin (HCC)   - Isabella Matthews has currently uncontrolled symptomatic type 2 DM since  51 years of age. Isabella Matthews presents with slightly increasing glycemic profile, point-of-care A1c of 6.9% increasing from 6.3% from her last visit.  However, overall improving from 10.3%.  She did not document any hypoglycemia.  Her average blood glucose is 145-155 mg per DL.  -Her recent labs were reviewed with her. - I had a long discussion with her about the progressive nature of diabetes and the pathology behind its complications. -her diabetes is complicated by neuropathy, obesity/sedentary life and she remains at a high risk for more acute and chronic complications which include CAD, CVA, CKD, retinopathy, and neuropathy. These are all discussed in detail with her.  - I have counseled her on diet  and weight management  by adopting a carbohydrate restricted/protein rich diet. Patient is encouraged to switch  to  unprocessed or minimally processed     complex starch and increased protein intake (animal or plant source), fruits, and vegetables. -  she is advised to stick to a routine mealtimes to eat 3 meals  a day and avoid unnecessary snacks ( to snack  only to correct hypoglycemia).   - she acknowledges that there is a room for improvement in her food and drink choices. - Suggestion is made for her to avoid simple carbohydrates  from her diet including Cakes, Sweet Desserts, Ice Cream, Soda (diet and regular), Sweet Tea, Candies, Chips, Cookies, Store Bought Juices, Alcohol , Artificial Sweeteners,  Coffee Creamer, and "Sugar-free" Products, Lemonade. This will help patient to have more stable blood glucose profile and potentially avoid unintended weight gain.  The following Lifestyle Medicine recommendations according to American College of Lifestyle Medicine  [facility]) were discussed and and offered to patient and she  agrees to start the journey:  A. Whole Foods, Plant-Based Nutrition comprising of fruits and vegetables, plant-based proteins, whole-grain carbohydrates was discussed in detail with the patient.   A list for source of those nutrients were also provided to the patient.  Patient will use only water or unsweetened tea for hydration. B.  The need to stay away from risky substances including alcohol, smoking; obtaining 7 to 9 hours of restorative sleep, at least 150 minutes of moderate intensity exercise weekly, the importance of healthy social connections,  and stress management techniques were discussed. C.  A full color page of  Calorie density of various food groups per pound showing examples of each food groups was provided to the patient.    - she will be scheduled with Norm Salt, RDN, CDE for diabetes education.  - I have approached her with the following individualized plan to manage  her diabetes and patient agrees:   -In light of her presentation with target A1c of 6.9%, she  will not need prandial insulin for now.   -She is advised to continue Tresiba 50 units nightly, discussed and increased her Trulicity to 4.5 mg subcutaneously weekly.   - she is warned not to take insulin without proper monitoring per orders. - she is encouraged to call clinic for blood glucose levels less than 70 or above 200 mg /dl.  -She reports GI intolerance from metformin.  - Specific targets for  A1c;  LDL, HDL, Triglycerides, and  Waist Circumference were discussed with the patient.  2) Blood Pressure /Hypertension:  -Her blood pressure is controlled to target.   she is advised to continue her current medications including hydrochlorothiazide 25 mg p.o. daily, losartan 100 mg p.o. daily.  The above detailed lifestyle nutrition will help with hypertension and hyperlipidemia.  3) Lipids/Hyperlipidemia: Recent fasting lipid panel showed LDL of 110, triglycerides 956.  She is advised to continue atorvastatin 20 mg p.o. daily at bedtime.       Side effects and precautions discussed with her.  4)  Weight/Diet: Her BMI is 61.93--   clearly complicating her diabetes care.   she is  a candidate for weight loss. I discussed with her the fact that loss of 5 - 10% of her  current body weight will have the most impact on her diabetes management.  Exercise, and detailed carbohydrates information provided  -  detailed on discharge instructions.  She is a perfect candidate for whole food plant-based diet described above. -She may benefit from bariatric surgery, will be discussed in more detail on subsequent visit.  5) vitamin D deficiency She is on ongoing supplement with vitamin D3 5000 units daily.  She is now vitamin D replete at 46.  She is advised to continue.  6) Chronic Care/Health Maintenance:  -she  is on ACEI/ARB and Statin medications and  is encouraged to initiate and  continue to follow up with Ophthalmology, Dentist,  Podiatrist at least yearly or according to recommendations, and  advised to   stay away from smoking. I have recommended yearly flu vaccine and pneumonia vaccine at least every 5 years; moderate intensity exercise for up to 150 minutes weekly; and  sleep for at least 7 hours a day.  - she is  advised to maintain close follow up with Kerri Perches, MD for primary care needs, as well as her other providers for optimal and coordinated care.  I spent 44 minutes in the care of the patient today including review of labs from CMP, Lipids, Thyroid Function, Hematology (current and previous including abstractions from other facilities); face-to-face time discussing  her blood glucose readings/logs, discussing hypoglycemia and hyperglycemia episodes and symptoms, medications doses, her options of short and long term treatment based on the latest standards of care / guidelines;  discussion about incorporating lifestyle medicine;  and documenting the encounter.    Please refer to Patient Instructions for Blood Glucose Monitoring and Insulin/Medications Dosing Guide"  in media tab for additional information. Please  also refer to " Patient Self Inventory" in the Media  tab for reviewed elements of pertinent patient history.  Isabella Matthews participated in the discussions, expressed understanding, and voiced agreement with the above plans.  All questions were answered to her satisfaction. she is encouraged to contact clinic should she have any questions or concerns prior to her return visit.   Follow up plan: - Return in about 3 months (around 08/31/2021) for Bring Meter/CGM Device/Logs- A1c in Office, Urine MA - [place].  Marquis Lunch, MD [facility] [address] [place], [place] [address] Phone: [phone]  Fax: [phone]    05/31/2021, 9:56 AM  This note was partially dictated with voice recognition software. Similar sounding words can be transcribed inadequately or may not  be corrected upon review.

## 2021-06-06 LAB — HM DIABETES EYE EXAM

## 2021-07-15 ENCOUNTER — Encounter: Payer: Self-pay | Admitting: Family Medicine

## 2021-07-15 ENCOUNTER — Ambulatory Visit (INDEPENDENT_AMBULATORY_CARE_PROVIDER_SITE_OTHER): Payer: No Typology Code available for payment source | Admitting: Family Medicine

## 2021-07-15 VITALS — BP 94/63 | HR 89 | Ht 63.0 in | Wt 365.8 lb

## 2021-07-15 DIAGNOSIS — L03116 Cellulitis of left lower limb: Secondary | ICD-10-CM

## 2021-07-15 MED ORDER — CLINDAMYCIN HCL 150 MG PO CAPS: 150.0000 mg | ORAL_CAPSULE | Freq: Three times a day (TID) | ORAL | 0 refills | Status: AC

## 2021-07-20 ENCOUNTER — Other Ambulatory Visit: Payer: Self-pay | Admitting: "Endocrinology

## 2021-07-20 DIAGNOSIS — E1121 Type 2 diabetes mellitus with diabetic nephropathy: Secondary | ICD-10-CM

## 2021-08-10 ENCOUNTER — Other Ambulatory Visit: Payer: Self-pay | Admitting: "Endocrinology

## 2021-08-25 ENCOUNTER — Other Ambulatory Visit: Payer: Self-pay | Admitting: Family Medicine

## 2021-09-01 ENCOUNTER — Ambulatory Visit (INDEPENDENT_AMBULATORY_CARE_PROVIDER_SITE_OTHER): Payer: No Typology Code available for payment source | Admitting: "Endocrinology

## 2021-09-01 ENCOUNTER — Encounter: Payer: Self-pay | Admitting: "Endocrinology

## 2021-09-01 VITALS — BP 136/84 | HR 92 | Ht 63.0 in | Wt 361.4 lb

## 2021-09-01 DIAGNOSIS — I10 Essential (primary) hypertension: Secondary | ICD-10-CM | POA: Diagnosis not present

## 2021-09-01 DIAGNOSIS — E559 Vitamin D deficiency, unspecified: Secondary | ICD-10-CM | POA: Diagnosis not present

## 2021-09-01 DIAGNOSIS — E1121 Type 2 diabetes mellitus with diabetic nephropathy: Secondary | ICD-10-CM | POA: Diagnosis not present

## 2021-09-01 DIAGNOSIS — E782 Mixed hyperlipidemia: Secondary | ICD-10-CM | POA: Diagnosis not present

## 2021-09-01 DIAGNOSIS — Z794 Long term (current) use of insulin: Secondary | ICD-10-CM | POA: Diagnosis not present

## 2021-09-01 LAB — POCT UA - MICROALBUMIN
Albumin/Creatinine Ratio, Urine, POC: <30
Creatinine, POC: 300 mg/dL
Microalbumin Ur, POC: 30 mg/L

## 2021-09-01 LAB — POCT GLYCOSYLATED HEMOGLOBIN (HGB A1C): HbA1c, POC (controlled diabetic range): 7 % (ref 0.0–7.0)

## 2021-09-01 MED ORDER — AMLODIPINE BESYLATE 5 MG PO TABS: 5.0000 mg | ORAL_TABLET | Freq: Every day | ORAL | 1 refills | Status: DC

## 2021-09-01 NOTE — Progress Notes (Addendum)
09/01/2021, 5:32 PM                                 Endocrinology follow-up note   Subjective:    Patient ID: Isabella Matthews, female    DOB: 05/03/1970.  Isabella Matthews is being seen in follow-up for the management of currently controlled type 2 diabetes, hyperlipidemia, hypertension. PMD: Kerri Perches, MD.   Past Medical History:  Diagnosis Date   Aortic stenosis, mild    Aortic valve disorders    Diabetes mellitus    Diabetes mellitus without complication (HCC)    Phreesia 02/08/2020   Edema    Essential hypertension    Hypertension    Phreesia 02/08/2020   Morbid obesity (HCC)    Onychomycosis of toenail     Past Surgical History:  Procedure Laterality Date   CESAREAN SECTION     x2    Social History   Socioeconomic History   Marital status: Single    Spouse name: Not on file   Number of children: Not on file   Years of education: Not on file   Highest education level: Not on file  Occupational History   Occupation: CNA - [employer]   Tobacco Use   Smoking status: Former    Packs/day: 0.50    Types: Cigarettes    Quit date: 11/23/2014    Years since quitting: 6.7   Smokeless tobacco: Never  Vaping Use   Vaping Use: Never used  Substance and Sexual Activity   Alcohol use: Yes    Alcohol/week: 0.0 standard drinks of alcohol    Comment: occasionally   Drug use: No   Sexual activity: Not on file  Other Topics Concern   Not on file  Social History Narrative   Not on file   Social Determinants of Health   Financial Resource Strain: Not on file  Food Insecurity: Not on file  Transportation Needs: Not on file  Physical Activity: Not on file  Stress: Not on file  Social Connections: Not on file    Family History  Problem Relation Age of Onset   Hypertension Mother    Hypertension Father    Diabetes Father     Outpatient Encounter Medications as of 09/01/2021  Medication Sig   amLODipine (NORVASC) 5 MG  tablet Take 1 tablet (5 mg total) by mouth daily.   aspirin 81 MG EC tablet Take 1 tablet (81 mg total) by mouth daily. Swallow whole.   Cholecalciferol (VITAMIN D3) 125 MCG (5000 UT) CAPS Take 1 capsule (5,000 Units total) by mouth daily.   gabapentin (NEURONTIN) 300 MG capsule TAKE 1 CAPSULE (300 MG TOTAL) BY MOUTH AT BEDTIME.   glucose blood test strip 1 each by Other route 2 (two) times daily. Use as instructed   losartan (COZAAR) 100 MG tablet TAKE 1 TABLET (100 MG TOTAL) BY MOUTH DAILY.   Multiple Vitamin (MULTIVITAMIN) tablet Take 1 tablet by mouth daily.   nystatin (MYCOSTATIN/NYSTOP) powder Apply topically as needed (for rash/irritation). (Patient not taking: Reported on 09/01/2021)   rosuvastatin (CRESTOR) 20 MG tablet Take 1 tablet (20 mg total) by mouth daily.   TRESIBA FLEXTOUCH 200 UNIT/ML FlexTouch Pen INJECT 50 UNITS INTO THE SKIN AT BEDTIME.   TRULICITY 4.5 MG/0.5ML SOPN INJECT 4.5 MG AS DIRECTED ONCE A WEEK.   [DISCONTINUED] amLODipine (NORVASC) 2.5 MG tablet TAKE ONE TABLET BY  MOUTH ONCE DAILY   [DISCONTINUED] hydrochlorothiazide (HYDRODIURIL) 25 MG tablet TAKE 1 TABLET (25 MG TOTAL) BY MOUTH DAILY.   No facility-administered encounter medications on file as of 09/01/2021.    ALLERGIES: Allergies  Allergen Reactions   Penicillins Swelling    Has patient had a PCN reaction causing immediate rash, facial/tongue/throat swelling, SOB or lightheadedness with hypotension: Yes Has patient had a PCN reaction causing severe rash involving mucus membranes or skin necrosis: No Has patient had a PCN reaction that required hospitalization: No Has patient had a PCN reaction occurring within the last 10 years: Yes If all of the above answers are "NO", then may proceed with Cephalosporin use. Leg swelling/redness   Ace Inhibitors Cough   Metformin And Related Nausea Only   Tramadol Rash    Scant rash on hands  After starting tramadol for knee pain in Jan 16, 2013    VACCINATION  STATUS: Immunization History  Administered Date(s) Administered   Influenza Split 10/27/2013   Influenza Whole 12/29/2005, 09/29/2009   Influenza,inj,Quad PF,6+ Mos 10/17/2012, 10/24/2016, 10/01/2018, 10/08/2019, 03/03/2021   Moderna Sars-Covid-2 Vaccination 01/28/2019, 12/16/2019, 07/22/2020   Pneumococcal Polysaccharide-23 01/12/2006, 07/09/2015   Tdap 08/12/2010, 09/08/2020   Zoster Recombinat (Shingrix) 07/23/2019, 10/08/2019    Diabetes She presents for her follow-up diabetic visit. She has type 2 diabetes mellitus. Onset time: She was diagnosed at approximate age of 51 years, she did have an episode of gestational diabetes. Her disease course has been stable. There are no hypoglycemic associated symptoms. Pertinent negatives for hypoglycemia include no confusion, headaches, pallor or seizures. Pertinent negatives for diabetes include no chest pain, no fatigue, no polydipsia, no polyphagia and no polyuria. There are no hypoglycemic complications. Symptoms are stable. Diabetic complications include peripheral neuropathy. Risk factors for coronary artery disease include diabetes mellitus, dyslipidemia, family history, hypertension, obesity and sedentary lifestyle. Current diabetic treatment includes insulin injections and oral agent (monotherapy). She is compliant with treatment most of the time. Her weight is fluctuating minimally. She is following a generally unhealthy diet. When asked about meal planning, she reported none. She has not had a previous visit with a dietitian. She rarely participates in exercise. Her home blood glucose trend is increasing steadily. Her breakfast blood glucose range is generally 130-140 mg/dl. Her bedtime blood glucose range is generally 140-180 mg/dl. Her overall blood glucose range is 140-180 mg/dl. Isabella Matthews(Isabella Matthews presents with near target glycemic profile, A1c of 7%.  She has improved her A1c overall from 10.3%.  She did not document any hypoglycemia. ) An ACE  inhibitor/angiotensin II receptor blocker is being taken. Eye exam is current.  Hyperlipidemia This is a chronic problem. The current episode started more than 1 year ago. The problem is controlled. Exacerbating diseases include diabetes and obesity. Pertinent negatives include no chest pain, myalgias or shortness of breath. Current antihyperlipidemic treatment includes statins. Risk factors for coronary artery disease include dyslipidemia, family history, obesity, hypertension, a sedentary lifestyle and diabetes mellitus.  Hypertension This is a chronic problem. The current episode started more than 1 year ago. Pertinent negatives include no chest pain, headaches, palpitations or shortness of breath. Risk factors for coronary artery disease include diabetes mellitus, dyslipidemia, obesity and sedentary lifestyle. Past treatments include angiotensin blockers.     Review of systems: Limited as above.  Objective:    BP 136/84   Pulse 92   Ht 5\' 3"  (1.6 m)   Wt (!) 361 lb 6.4 oz (163.9 kg)   LMP  (LMP Unknown)   BMI  64.02 kg/m   Wt Readings from Last 3 Encounters:  09/01/21 (!) 361 lb 6.4 oz (163.9 kg)  07/15/21 (!) 365 lb 12.8 oz (165.9 kg)  05/31/21 (!) 349 lb 9.6 oz (158.6 kg)     Physical Exam- Limited  Constitutional:  Body mass index is 64.02 kg/m. , not in acute distress, normal state of mind    CMP ( most recent) CMP     Component Value Date/Time   NA 142 05/23/2021 1522   K 4.4 05/23/2021 1522   CL 101 05/23/2021 1522   CO2 26 05/23/2021 1522   GLUCOSE 122 (H) 05/23/2021 1522   GLUCOSE 138 (H) 06/19/2019 0721   BUN 18 05/23/2021 1522   CREATININE 0.99 05/23/2021 1522   CREATININE 0.87 06/19/2019 0721   CALCIUM 10.4 (H) 05/23/2021 1522   PROT 7.9 05/23/2021 1522   ALBUMIN 4.5 05/23/2021 1522   AST 20 05/23/2021 1522   ALT 16 05/23/2021 1522   ALKPHOS 87 05/23/2021 1522   BILITOT 0.4 05/23/2021 1522   GFRNONAA 70 02/02/2020 1128   GFRNONAA 101 02/14/2019 0848    GFRAA 81 02/02/2020 1128   GFRAA 117 02/14/2019 0848     Diabetic Labs (most recent): Lab Results  Component Value Date   HGBA1C 7.0 09/01/2021   HGBA1C 6.9 05/31/2021   HGBA1C 6.3 01/28/2021   MICROALBUR 30 09/01/2021   MICROALBUR 3.0 (H) 07/23/2019   MICROALBUR 33.3 01/27/2017     Lipid Panel ( most recent) Lipid Panel     Component Value Date/Time   CHOL 213 (H) 03/03/2021 1641   TRIG 116 03/03/2021 1641   HDL 83 03/03/2021 1641   CHOLHDL 2.6 03/03/2021 1641   CHOLHDL 2.2 07/23/2019 1010   VLDL 22 03/16/2016 0808   LDLCALC 110 (H) 03/03/2021 1641   LDLCALC 89 07/23/2019 1010   LABVLDL 20 03/03/2021 1641      Lab Results  Component Value Date   TSH 0.838 09/17/2020   TSH 0.98 06/19/2019   TSH 0.88 02/14/2019   TSH 0.96 01/27/2017   TSH 1.18 11/05/2015   TSH 1.603 07/10/2014   TSH 1.152 10/14/2012   TSH 1.202 10/14/2012   TSH 0.437 04/20/2011   TSH 1.274 04/06/2009   FREET4 1.36 09/17/2020   FREET4 1.2 06/19/2019   FREET4 1.2 02/14/2019     Assessment & Plan:   1. Type 2 diabetes mellitus with diabetic nephropathy, with long-term current use of insulin (HCC)   - Isabella Matthews has currently uncontrolled symptomatic type 2 DM since  51 years of age.  Isabella Matthews presents with near target glycemic profile, A1c of 7%.  She has improved her A1c overall from 10.3%.  She did not document any hypoglycemia.   -Her recent labs were reviewed with her. - I had a long discussion with her about the progressive nature of diabetes and the pathology behind its complications. -her diabetes is complicated by neuropathy, obesity/sedentary life and she remains at a high risk for more acute and chronic complications which include CAD, CVA, CKD, retinopathy, and neuropathy. These are all discussed in detail with her.  - I have counseled her on diet  and weight management  by adopting a carbohydrate restricted/protein rich diet. Patient is encouraged to switch to  unprocessed  or minimally processed     complex starch and increased protein intake (animal or plant source), fruits, and vegetables. -Admittedly, she has not engaged optimally to lifestyle medicine.  - she acknowledges that there is a room for  improvement in her food and drink choices. - Suggestion is made for her to avoid simple carbohydrates  from her diet including Cakes, Sweet Desserts, Ice Cream, Soda (diet and regular), Sweet Tea, Candies, Chips, Cookies, Store Bought Juices, Alcohol , Artificial Sweeteners,  Coffee Creamer, and "Sugar-free" Products, Lemonade. This will help patient to have more stable blood glucose profile and potentially avoid unintended weight gain.  The following Lifestyle Medicine recommendations according to American College of Lifestyle Medicine  [facility]) were discussed and and offered to patient and she  agrees to start the journey:  A. Whole Foods, Plant-Based Nutrition comprising of fruits and vegetables, plant-based proteins, whole-grain carbohydrates was discussed in detail with the patient.   A list for source of those nutrients were also provided to the patient.  Patient will use only water or unsweetened tea for hydration. B.  The need to stay away from risky substances including alcohol, smoking; obtaining 7 to 9 hours of restorative sleep, at least 150 minutes of moderate intensity exercise weekly, the importance of healthy social connections,  and stress management techniques were discussed. C.  A full color page of  Calorie density of various food groups per pound showing examples of each food groups was provided to the patient.   - she will be scheduled with Norm Salt, RDN, CDE for diabetes education.  - I have approached her with the following individualized plan to manage  her diabetes and patient agrees:   In light of her presentation with near target glycemic profile, she will not need prandial insulin for now. -She is advised to increase Tresiba to 60 units  nightly, advised to continue Trulicity 4.5 mg subcutaneously weekly.  - she is warned not to take insulin without proper monitoring per orders. - she is encouraged to call clinic for blood glucose levels less than 70 or above 200 mg /dl.  -She reports GI intolerance from metformin.  - Specific targets for  A1c;  LDL, HDL, Triglycerides, and  Waist Circumference were discussed with the patient.  2) Blood Pressure /Hypertension:  -Her blood pressure is controlled to target.  Her labs are showing mild hypercalcemia of 10.4.  She is advised to discontinue hydrochlorothiazide.  I advised her to increase her amlodipine to 5 mg p.o. daily along with her losartan 100 mg p.o. daily at breakfast..  The above detailed lifestyle nutrition will help with hypertension and hyperlipidemia.  3) Lipids/Hyperlipidemia: Recent fasting lipid panel showed LDL of 110, triglycerides 330.  She is advised to continue atorvastatin 20 mg p.o. daily at bedtime.  Whole food plant-based diet will help with dyslipidemia as well.      Side effects and precautions discussed with her.  4)  Weight/Diet: Her BMI is 64.02-  clearly complicating her diabetes care.   she is  a candidate for weight loss. I discussed with her the fact that loss of 5 - 10% of her  current body weight will have the most impact on her diabetes management.  Exercise, and detailed carbohydrates information provided  -  detailed on discharge instructions.  She is a perfect candidate for whole food plant-based diet described above. -She may benefit from bariatric surgery, will be discussed in more detail on subsequent visit.  5) vitamin D deficiency She is on ongoing supplement with vitamin D3 5000 units daily.  She is now vitamin D replete at 46.  She is advised to continue.  6) Chronic Care/Health Maintenance:  -she  is on ACEI/ARB and Statin medications  and  is encouraged to initiate and continue to follow up with Ophthalmology, Dentist,  Podiatrist at  least yearly or according to recommendations, and advised to   stay away from smoking. I have recommended yearly flu vaccine and pneumonia vaccine at least every 5 years; moderate intensity exercise for up to 150 minutes weekly; and  sleep for at least 7 hours a day.  - she is  advised to maintain close follow up with Kerri Perches, MD for primary care needs, as well as her other providers for optimal and coordinated care.   I spent 41 minutes in the care of the patient today including review of labs from CMP, Lipids, Thyroid Function, Hematology (current and previous including abstractions from other facilities); face-to-face time discussing  her blood glucose readings/logs, discussing hypoglycemia and hyperglycemia episodes and symptoms, medications doses, her options of short and long term treatment based on the latest standards of care / guidelines;  discussion about incorporating lifestyle medicine;  and documenting the encounter. Risk reduction counseling performed per USPSTF guidelines to reduce obesity and cardiovascular risk factors.     Please refer to Patient Instructions for Blood Glucose Monitoring and Insulin/Medications Dosing Guide"  in media tab for additional information. Please  also refer to " Patient Self Inventory" in the Media  tab for reviewed elements of pertinent patient history.  Isabella Matthews participated in the discussions, expressed understanding, and voiced agreement with the above plans.  All questions were answered to her satisfaction. she is encouraged to contact clinic should she have any questions or concerns prior to her return visit.   Follow up plan: - Return in about 4 months (around 01/01/2022) for F/U with Pre-visit Labs, Meter/CGM/Logs, A1c here.  Marquis Lunch, MD [facility] [address] [place], [place] [address] Phone: [phone]  Fax: [phone]    09/01/2021, 5:32 PM  This note was  partially dictated with voice recognition software. Similar sounding words can be transcribed inadequately or may not  be corrected upon review.

## 2021-09-01 NOTE — Patient Instructions (Signed)
                                     Advice for Weight Management  -For most of us the best way to lose weight is by diet management. Generally speaking, diet management means consuming less calories intentionally which over time brings about progressive weight loss.  This can be achieved more effectively by avoiding ultra processed carbohydrates, processed meats, unhealthy fats.    It is critically important to know your numbers: how much calorie you are consuming and how much calorie you need. More importantly, our carbohydrates sources should be unprocessed naturally occurring  complex starch food items.  It is always important to balance nutrition also by  appropriate intake of proteins (mainly plant-based), healthy fats/oils, plenty of fruits and vegetables.   -The American College of Lifestyle Medicine (ACL M) recommends nutrition derived mostly from Whole Food, Plant Predominant Sources example an apple instead of applesauce or apple pie. Eat Plenty of vegetables, Mushrooms, fruits, Legumes, Whole Grains, Nuts, seeds in lieu of processed meats, processed snacks/pastries red meat, poultry, eggs.  Use only water or unsweetened tea for hydration.  The College also recommends the need to stay away from risky substances including alcohol, smoking; obtaining 7-9 hours of restorative sleep, at least 150 minutes of moderate intensity exercise weekly, importance of healthy social connections, and being mindful of stress and seek help when it is overwhelming.    -Sticking to a routine mealtime to eat 3 meals a day and avoiding unnecessary snacks is shown to have a big role in weight control. Under normal circumstances, the only time we burn stored energy is when we are hungry, so allow  some hunger to take place- hunger means no food between appropriate meal times, only water.  It is not advisable to starve.   -It is better to avoid simple carbohydrates including:  Cakes, Sweet Desserts, Ice Cream, Soda (diet and regular), Sweet Tea, Candies, Chips, Cookies, Store Bought Juices, Alcohol in Excess of  1-2 drinks a day, Lemonade,  Artificial Sweeteners, Doughnuts, Coffee Creamers, "Sugar-free" Products, etc, etc.  This is not a complete list.....    -Consulting with certified diabetes educators is proven to provide you with the most accurate and current information on diet.  Also, you may be  interested in discussing diet options/exchanges , we can schedule a visit with Isabella Matthews, RDN, CDE for individualized nutrition education.  -Exercise: If you are able: 30 -60 minutes a day ,4 days a week, or 150 minutes of moderate intensity exercise weekly.    The longer the better if tolerated.  Combine stretch, strength, and aerobic activities.  If you were told in the past that you have high risk for cardiovascular diseases, or if you are currently symptomatic, you may seek evaluation by your heart doctor prior to initiating moderate to intense exercise programs.                                  Additional Care Considerations for Diabetes/Prediabetes   -Diabetes  is a chronic disease.  The most important care consideration is regular follow-up with your diabetes care provider with the goal being avoiding or delaying its complications and to take advantage of advances in medications and technology.  If appropriate actions are taken early enough, type 2 diabetes can even be   reversed.  Seek information from the right source.  - Whole Food, Plant Predominant Nutrition is highly recommended: Eat Plenty of vegetables, Mushrooms, fruits, Legumes, Whole Grains, Nuts, seeds in lieu of processed meats, processed snacks/pastries red meat, poultry, eggs as recommended by American College of  Lifestyle Medicine (ACLM).  -Type 2 diabetes is known to coexist with other important comorbidities such as high blood pressure and high cholesterol.  It is critical to control not only the  diabetes but also the high blood pressure and high cholesterol to minimize and delay the risk of complications including coronary artery disease, stroke, amputations, blindness, etc.  The good news is that this diet recommendation for type 2 diabetes is also very helpful for managing high cholesterol and high blood blood pressure.  - Studies showed that people with diabetes will benefit from a class of medications known as ACE inhibitors and statins.  Unless there are specific reasons not to be on these medications, the standard of care is to consider getting one from these groups of medications at an optimal doses.  These medications are generally considered safe and proven to help protect the heart and the kidneys.    - People with diabetes are encouraged to initiate and maintain regular follow-up with eye doctors, foot doctors, dentists , and if necessary heart and kidney doctors.     - It is highly recommended that people with diabetes quit smoking or stay away from smoking, and get yearly  flu vaccine and pneumonia vaccine at least every 5 years.  See above for additional recommendations on exercise, sleep, stress management , and healthy social connections.      

## 2021-09-13 ENCOUNTER — Ambulatory Visit: Payer: No Typology Code available for payment source | Admitting: Internal Medicine

## 2021-09-13 ENCOUNTER — Encounter: Payer: Self-pay | Admitting: Internal Medicine

## 2021-09-13 VITALS — BP 144/78 | HR 97 | Ht 63.0 in | Wt 366.0 lb

## 2021-09-13 DIAGNOSIS — I10 Essential (primary) hypertension: Secondary | ICD-10-CM | POA: Diagnosis not present

## 2021-09-13 DIAGNOSIS — I872 Venous insufficiency (chronic) (peripheral): Secondary | ICD-10-CM | POA: Diagnosis not present

## 2021-09-13 DIAGNOSIS — I890 Lymphedema, not elsewhere classified: Secondary | ICD-10-CM | POA: Diagnosis not present

## 2021-09-13 MED ORDER — FUROSEMIDE 20 MG PO TABS: 20.0000 mg | ORAL_TABLET | Freq: Every day | ORAL | 3 refills | Status: DC

## 2021-09-13 MED ORDER — TRIAMCINOLONE ACETONIDE 0.1 % EX CREA: 1.0000 | TOPICAL_CREAM | Freq: Two times a day (BID) | CUTANEOUS | 0 refills | Status: DC

## 2021-09-13 NOTE — Patient Instructions (Signed)
Please start taking Lasix as prescribed.  Please apply Kenalog cream for itching.

## 2021-09-13 NOTE — Assessment & Plan Note (Signed)
Rash and swelling b/l legs, less likely cellulitis or DVT Kenalog cream for stasis dermatitis Leg elevation Compression stocking for leg swelling May benefit from PT for lymphedema

## 2021-09-13 NOTE — Progress Notes (Signed)
Acute Office Visit  Subjective:    Patient ID: Isabella Matthews, female    DOB: 1970/05/10, 51 y.o.   MRN: [id number]  Chief Complaint  Patient presents with   Cellulitis    Legs swelling started back in April was taking hydrochlorothiazide but Dr.Nida stopped It 09/01/21.    HPI Patient is in today for complaint of recent worsening of her chronic leg swelling.  Of note, she was taking HCTZ for HTN, which was recently stopped due to hypercalcemia.  Her dose of amlodipine was also increased, which could also worsen her leg swelling.  She has been having intense itching over the lower leg area.  She was recently treated with clindamycin for possible cellulitis of left leg.  She denies any skin breakdown or local discharge from the legs.  Her BP was elevated today.  She denies any chest pain, dyspnea or palpitations.  Past Medical History:  Diagnosis Date   Aortic stenosis, mild    Aortic valve disorders    Diabetes mellitus    Diabetes mellitus without complication (HCC)    Phreesia 02/08/2020   Edema    Essential hypertension    Hypertension    [employer]reesia 02/08/2020   Morbid obesity (HCC)    Onychomycosis of toenail     Past Surgical History:  Procedure Laterality Date   CESAREAN SECTION     x2    Family History  Problem Relation Age of Onset   Hypertension Mother    Hypertension Father    Diabetes Father     Social History   Socioeconomic History   Marital status: Single    Spouse name: Not on file   Number of children: Not on file   Years of education: Not on file   Highest education level: Not on file  Occupational History   Occupation: CNA - [employer]   Tobacco Use   Smoking status: Former    Packs/day: 0.50    Types: Cigarettes    Quit date: 11/23/2014    Years since quitting: 6.8   Smokeless tobacco: Never  Vaping Use   Vaping Use: Never used  Substance and Sexual Activity   Alcohol use: Yes    Alcohol/week: 0.0 standard drinks of alcohol     Comment: occasionally   Drug use: No   Sexual activity: Not on file  Other Topics Concern   Not on file  Social History Narrative   Not on file   Social Determinants of Health   Financial Resource Strain: Not on file  Food Insecurity: Not on file  Transportation Needs: Not on file  Physical Activity: Not on file  Stress: Not on file  Social Connections: Not on file  Intimate Partner Violence: Not on file    Outpatient Medications Prior to Visit  Medication Sig Dispense Refill   amLODipine (NORVASC) 5 MG tablet Take 1 tablet (5 mg total) by mouth daily. 90 tablet 1   aspirin 81 MG EC tablet Take 1 tablet (81 mg total) by mouth daily. Swallow whole. 90 tablet 3   Cholecalciferol (VITAMIN D3) 125 MCG (5000 UT) CAPS Take 1 capsule (5,000 Units total) by mouth daily. 90 capsule 0   gabapentin (NEURONTIN) 300 MG capsule TAKE 1 CAPSULE (300 MG TOTAL) BY MOUTH AT BEDTIME. 90 capsule 3   glucose blood test strip 1 each by Other route 2 (two) times daily. Use as instructed 200 each 2   losartan (COZAAR) 100 MG tablet TAKE 1 TABLET (100 MG TOTAL) BY  MOUTH DAILY. 90 tablet 3   Multiple Vitamin (MULTIVITAMIN) tablet Take 1 tablet by mouth daily.     rosuvastatin (CRESTOR) 20 MG tablet Take 1 tablet (20 mg total) by mouth daily. 90 tablet 3   TRESIBA FLEXTOUCH 200 UNIT/ML FlexTouch Pen INJECT 50 UNITS INTO THE SKIN AT BEDTIME. 27 mL 0   TRULICITY 4.5 MG/0.5ML SOPN INJECT 4.5 MG AS DIRECTED ONCE A WEEK. 6 mL 0   nystatin (MYCOSTATIN/NYSTOP) powder Apply topically as needed (for rash/irritation). (Patient not taking: Reported on 09/01/2021) 30 g 0   No facility-administered medications prior to visit.    Allergies  Allergen Reactions   Penicillins Swelling    Has patient had a PCN reaction causing immediate rash, facial/tongue/throat swelling, SOB or lightheadedness with hypotension: Yes Has patient had a PCN reaction causing severe rash involving mucus membranes or skin necrosis: No Has  patient had a PCN reaction that required hospitalization: No Has patient had a PCN reaction occurring within the last 10 years: Yes If all of the above answers are "NO", then may proceed with Cephalosporin use. Leg swelling/redness   Ace Inhibitors Cough   Metformin And Related Nausea Only   Tramadol Rash    Scant rash on hands  After starting tramadol for knee pain in Jan 16, 2013    Review of Systems  Constitutional:  Negative for chills and fever.  Respiratory:  Negative for cough and shortness of breath.   Cardiovascular:  Positive for leg swelling. Negative for chest pain and palpitations.  Gastrointestinal:  Negative for diarrhea, nausea and vomiting.  Genitourinary:  Negative for dysuria and hematuria.  Musculoskeletal:  Negative for neck pain and neck stiffness.  Skin:  Positive for rash.  Neurological:  Negative for dizziness and weakness.  Psychiatric/Behavioral:  Negative for agitation and behavioral problems.        Objective:    Physical Exam Vitals reviewed.  Constitutional:      General: She is not in acute distress.    Appearance: She is obese. She is not diaphoretic.  HENT:     Head: Normocephalic and atraumatic.     Mouth/Throat:     Mouth: Mucous membranes are moist.  Eyes:     General: No scleral icterus.    Extraocular Movements: Extraocular movements intact.  Cardiovascular:     Rate and Rhythm: Normal rate and regular rhythm.     Heart sounds: Normal heart sounds. No murmur heard. Pulmonary:     Breath sounds: Normal breath sounds. No wheezing or rales.  Musculoskeletal:     Cervical back: Neck supple. No tenderness.     Right lower leg: Edema (2+) present.     Left lower leg: Edema (2+) present.  Skin:    General: Skin is warm.     Findings: Rash (Dark brownish discoloration over b/l legs - stasis dermatitis) present.  Neurological:     General: No focal deficit present.     Mental Status: She is alert and oriented to person, place, and time.   Psychiatric:        Mood and Affect: Mood normal.        Behavior: Behavior normal.     BP (!) 144/78 (BP Location: Left Arm, Cuff Size: Normal)   Pulse 97   Ht 5\' 3"  (1.6 m)   Wt (!) 366 lb (166 kg)   LMP  (LMP Unknown)   SpO2 96%   BMI 64.83 kg/m  Wt Readings from Last 3 Encounters:  09/13/21 Marland Kitchen)  366 lb (166 kg)  09/01/21 (!) 361 lb 6.4 oz (163.9 kg)  07/15/21 (!) 365 lb 12.8 oz (165.9 kg)        Assessment & Plan:   Problem List Items Addressed This Visit       Cardiovascular and Mediastinum   Essential hypertension, benign BP Readings from Last 1 Encounters:  09/13/21 (!) 144/78   uncontrolled with amlodipine 5 mg QD and losartan 100 mg QD Recently stopped HCTZ due to hypercalcemia Added Lasix 20 mg QD for leg swelling Counseled for compliance with the medications Advised DASH diet and moderate exercise/walking, at least 150 mins/week    Relevant Medications   furosemide (LASIX) 20 MG tablet     Musculoskeletal and Integument   Venous stasis dermatitis of both lower extremities - Primary    Rash and swelling b/l legs, less likely cellulitis or DVT Kenalog cream for stasis dermatitis Leg elevation Compression stocking for leg swelling May benefit from PT for lymphedema      Relevant Medications   triamcinolone cream (KENALOG) 0.1 %   Other Visit Diagnoses     Lymphedema     She has chronic b/l leg swelling, likely lymphedema Leg elevation and compression stocking advised May benefit from PT Added Lasix 20 mg QD for now        Meds ordered this encounter  Medications   triamcinolone cream (KENALOG) 0.1 %    Sig: Apply 1 Application topically 2 (two) times daily.    Dispense:  30 g    Refill:  0   furosemide (LASIX) 20 MG tablet    Sig: Take 1 tablet (20 mg total) by mouth daily.    Dispense:  30 tablet    Refill:  3     Malaiyah Achorn Concha Se, MD

## 2021-09-20 IMAGING — MG MM DIGITAL SCREENING BILAT W/ TOMO AND CAD
6 of 12 series · 6 of 36 positions shown · non-contrast
Comparison: Previous exam(s).

ACR Breast Density Category a: The breast tissue is almost entirely
fatty.

CLINICAL DATA: Screening.

EXAM:
DIGITAL SCREENING BILATERAL MAMMOGRAM WITH TOMOSYNTHESIS AND CAD
TECHNIQUE: Bilateral screening digital craniocaudal and mediolateral oblique
mammograms were obtained. Bilateral screening digital breast
tomosynthesis was performed. The images were evaluated with
computer-aided detection.

[L CC synth-2D]
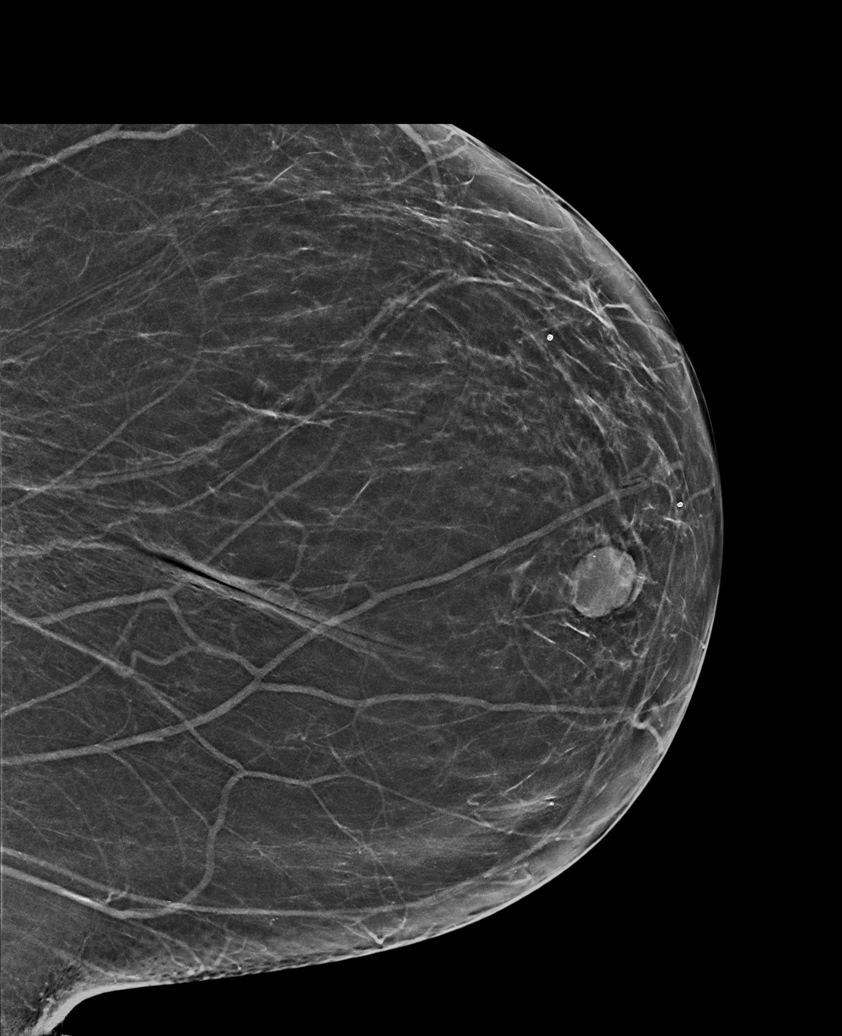

[R CC synth-2D]
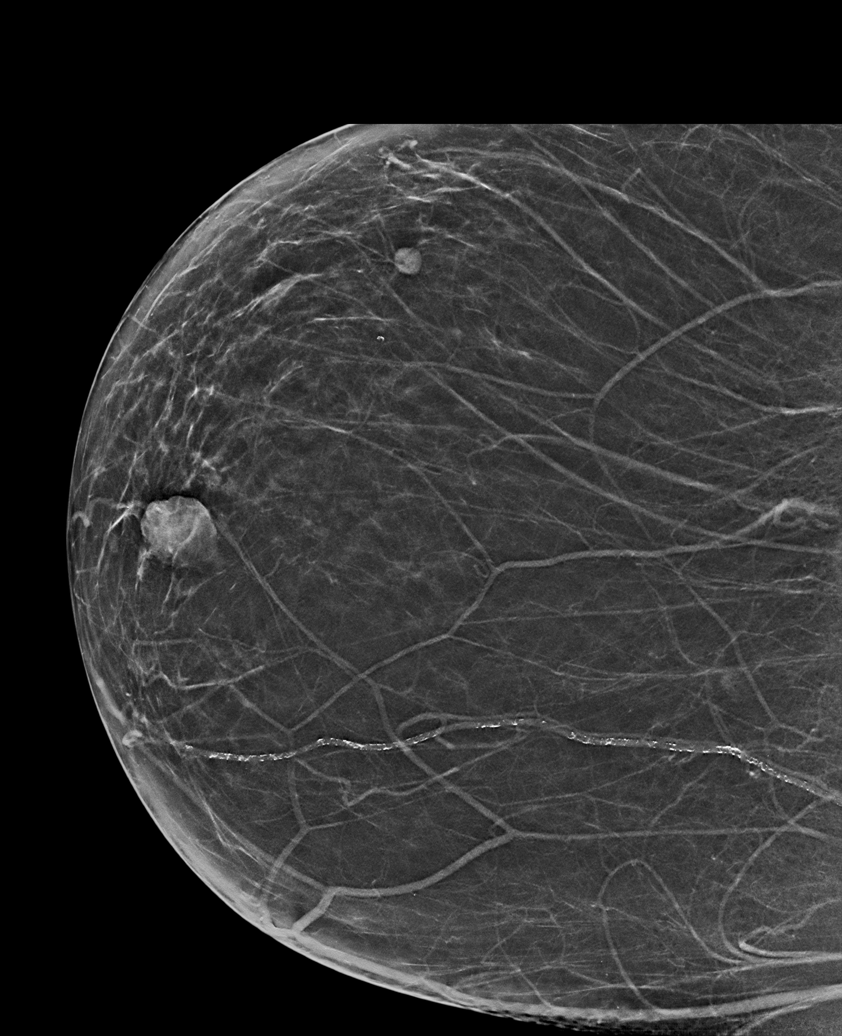

[L MLO synth-2D (1 of 2)]
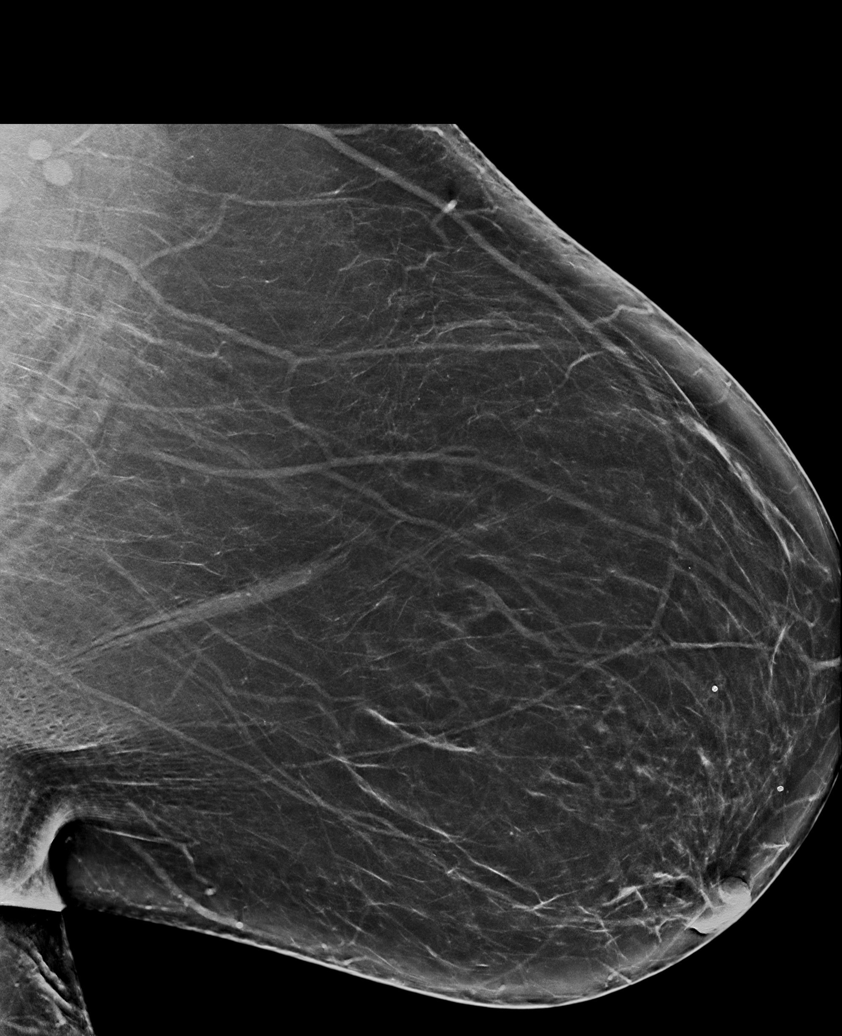

[L CV synth-2D]
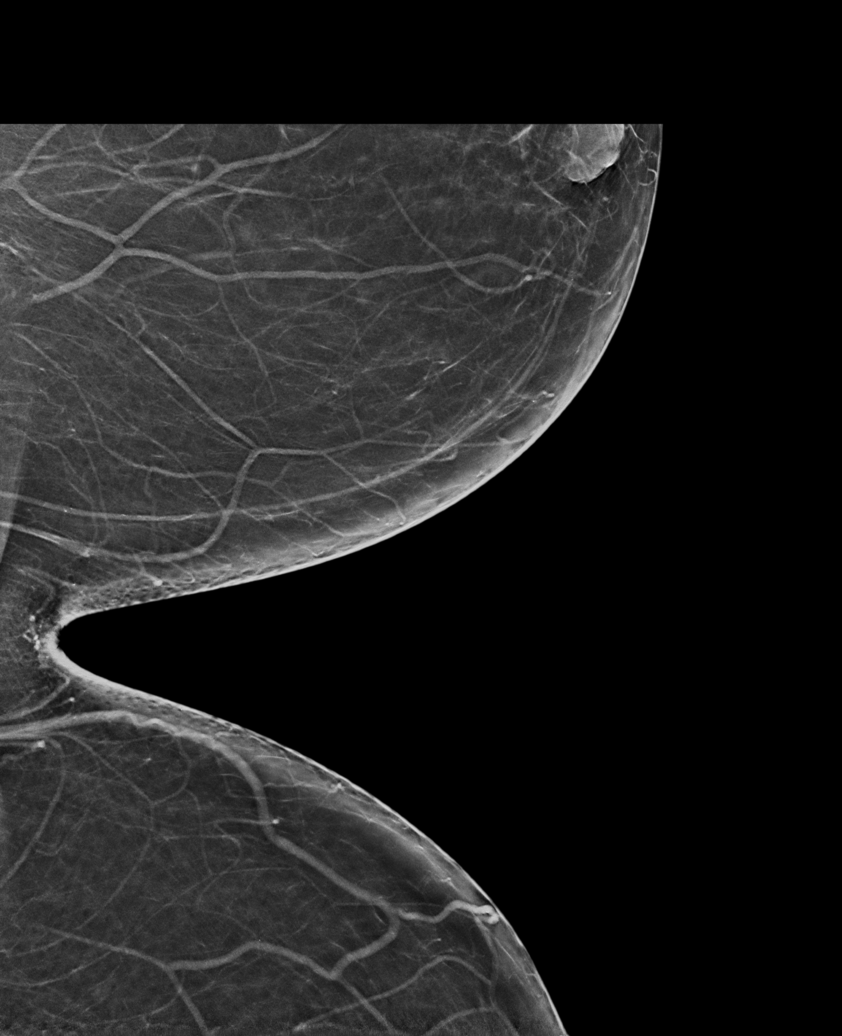

[L MLO synth-2D (2 of 2)]
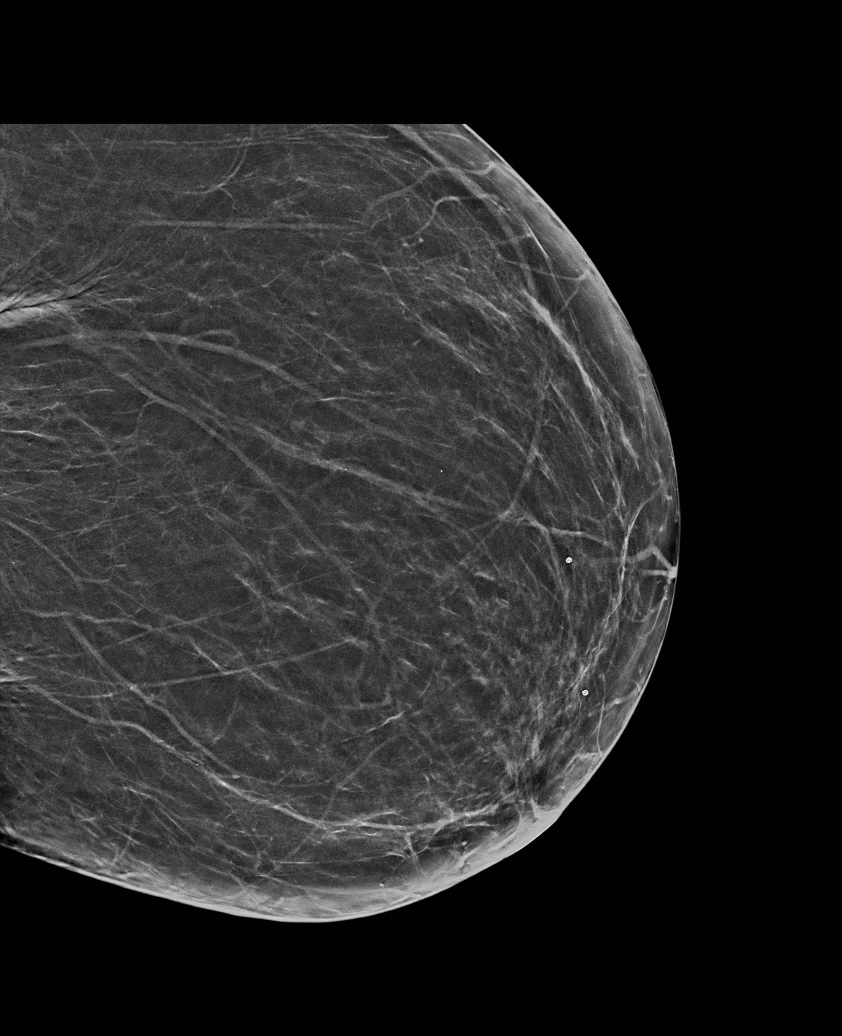

[R MLO synth-2D]
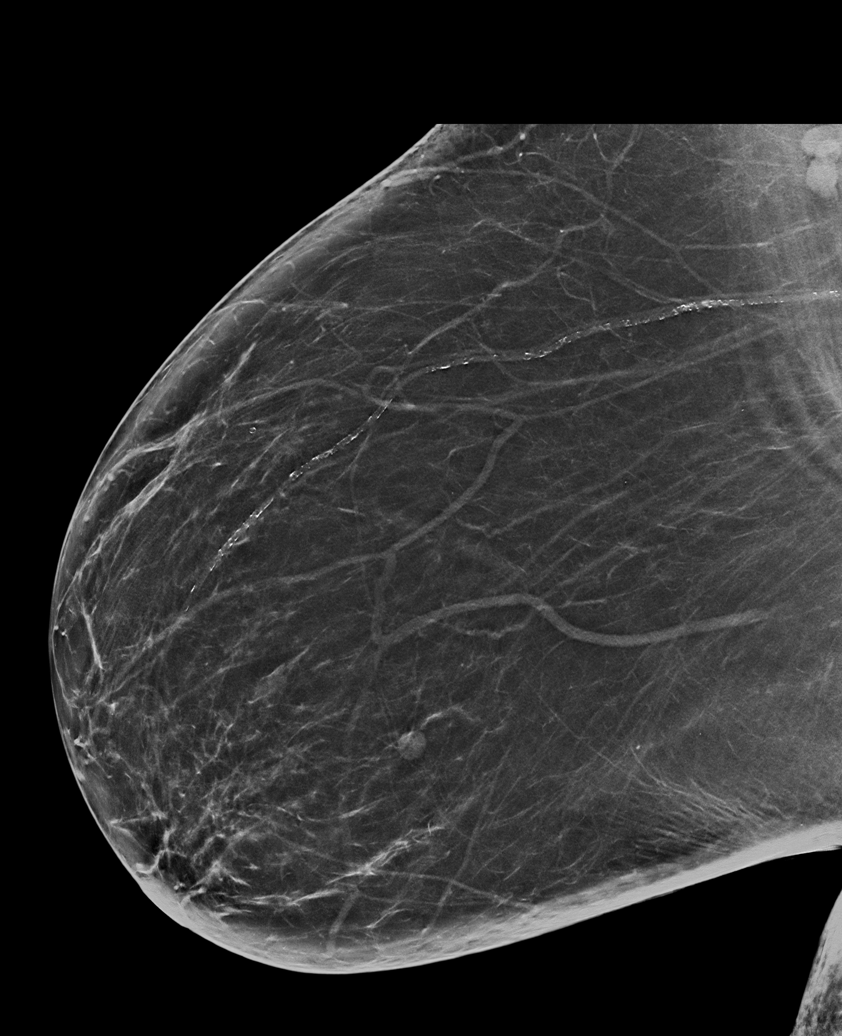

[6 of 36 positions shown; findings below may reference images not displayed]

FINDINGS: There are no findings suspicious for malignancy. The images were
evaluated with computer-aided detection.
IMPRESSION: No mammographic evidence of malignancy. A result letter of this
screening mammogram will be mailed directly to the patient.

RECOMMENDATION:
Screening mammogram in one year. (Code:JP-J-DD5)

BI-RADS CATEGORY  1: Negative.

## 2021-09-21 ENCOUNTER — Other Ambulatory Visit: Payer: Self-pay | Admitting: Family Medicine

## 2021-10-04 ENCOUNTER — Other Ambulatory Visit: Payer: Self-pay | Admitting: Family Medicine

## 2021-10-05 ENCOUNTER — Other Ambulatory Visit: Payer: Self-pay | Admitting: "Endocrinology

## 2021-10-05 DIAGNOSIS — Z794 Long term (current) use of insulin: Secondary | ICD-10-CM

## 2021-10-09 LAB — PTH, INTACT AND CALCIUM: PTH: 45 pg/mL (ref 15–65)

## 2021-10-09 LAB — PHOSPHORUS: Phosphorus: 3.8 mg/dL (ref 3.0–4.3)

## 2021-10-09 LAB — COMPREHENSIVE METABOLIC PANEL
ALT: 19 IU/L (ref 0–32)
AST: 23 IU/L (ref 0–40)
Albumin/Globulin Ratio: 1.4 (ref 1.2–2.2)
Albumin: 4.6 g/dL (ref 3.8–4.9)
Alkaline Phosphatase: 81 IU/L (ref 44–121)
BUN/Creatinine Ratio: 16 (ref 9–23)
BUN: 15 mg/dL (ref 6–24)
Bilirubin Total: 0.4 mg/dL (ref 0.0–1.2)
CO2: 26 mmol/L (ref 20–29)
Calcium: 10.2 mg/dL (ref 8.7–10.2)
Chloride: 100 mmol/L (ref 96–106)
Creatinine, Ser: 0.93 mg/dL (ref 0.57–1.00)
Globulin, Total: 3.2 g/dL (ref 1.5–4.5)
Glucose: 145 mg/dL — ABNORMAL HIGH (ref 70–99)
Potassium: 5 mmol/L (ref 3.5–5.2)
Sodium: 143 mmol/L (ref 134–144)
Total Protein: 7.8 g/dL (ref 6.0–8.5)
eGFR: 74 mL/min/{1.73_m2} (ref >59)

## 2021-10-09 LAB — VITAMIN D 25 HYDROXY (VIT D DEFICIENCY, FRACTURES): Vit D, 25-Hydroxy: 45 ng/mL (ref 30.0–100.0)

## 2021-10-09 LAB — MAGNESIUM: Magnesium: 2.1 mg/dL (ref 1.6–2.3)

## 2021-10-11 ENCOUNTER — Encounter: Payer: Self-pay | Admitting: Family Medicine

## 2021-10-11 ENCOUNTER — Ambulatory Visit: Payer: No Typology Code available for payment source | Admitting: Family Medicine

## 2021-10-11 VITALS — BP 136/83 | HR 103 | Ht 63.0 in | Wt 362.1 lb

## 2021-10-11 DIAGNOSIS — Z6841 Body Mass Index (BMI) 40.0 and over, adult: Secondary | ICD-10-CM

## 2021-10-11 DIAGNOSIS — Z23 Encounter for immunization: Secondary | ICD-10-CM | POA: Diagnosis not present

## 2021-10-11 DIAGNOSIS — E1121 Type 2 diabetes mellitus with diabetic nephropathy: Secondary | ICD-10-CM

## 2021-10-11 DIAGNOSIS — E1169 Type 2 diabetes mellitus with other specified complication: Secondary | ICD-10-CM

## 2021-10-11 DIAGNOSIS — E782 Mixed hyperlipidemia: Secondary | ICD-10-CM

## 2021-10-11 DIAGNOSIS — E785 Hyperlipidemia, unspecified: Secondary | ICD-10-CM

## 2021-10-11 DIAGNOSIS — I10 Essential (primary) hypertension: Secondary | ICD-10-CM

## 2021-10-11 DIAGNOSIS — Z794 Long term (current) use of insulin: Secondary | ICD-10-CM

## 2021-10-11 NOTE — Progress Notes (Signed)
Isabella Matthews     MRN: [id number]      DOB: 1970/07/06   HPI Isabella Matthews is here for follow up and re-evaluation of chronic medical conditions, medication management and review of any available recent lab and radiology data.  Preventive health is updated, specifically  Cancer screening and Immunization.   Questions or concerns regarding consultations or procedures which the PT has had in the interim are  addressed. The PT denies any adverse reactions to current medications since the last visit.  There are no new concerns.  There are no specific complaints   ROS Denies recent fever or chills. Denies sinus pressure, nasal congestion, ear pain or sore throat. Denies chest congestion, productive cough or wheezing. Denies chest pains, palpitations and leg swelling Denies abdominal pain, nausea, vomiting,diarrhea or constipation.   Denies dysuria, frequency, hesitancy or incontinence. Chronic  joint pain, swelling and limitation in mobility. Denies headaches, seizures, numbness, or tingling. Denies depression, anxiety or insomnia. Denies skin break down or rash.   PE  BP 136/83 (BP Location: Right Arm, Patient Position: Sitting, Cuff Size: Large)   Pulse (!) 103   Ht 5\' 3"  (1.6 m)   Wt (!) 362 lb 1.9 oz (164.3 kg)   LMP  (LMP Unknown)   SpO2 93%   BMI 64.15 kg/m   Patient alert and oriented and in no cardiopulmonary distress.  HEENT: No facial asymmetry, EOMI,     Neck supple .  Chest: Clear to auscultation bilaterally.  CVS: S1, S2 no murmurs, no S3.Regular rate.  ABD: Soft non tender.   Ext: No edema  MS: decreased  ROM spine, adequate in  shoulders, hips and  reduced in knees.  Skin: Intact, no ulcerations or rash noted.  Psych: Good eye contact, normal affect. Memory intact not anxious or depressed appearing.  CNS: CN 2-12 intact, power,  normal throughout.no focal deficits noted.   Assessment & Plan  Essential hypertension, benign Controlled, no  change in medication DASH diet and commitment to daily physical activity for a minimum of 30 minutes discussed and encouraged, as a part of hypertension management. The importance of attaining a healthy weight is also discussed.     10/11/2021    4:03 PM 09/13/2021    5:10 PM 09/13/2021    4:33 PM 09/13/2021    4:31 PM 09/01/2021    9:50 AM 07/15/2021    9:32 AM 05/31/2021    9:19 AM  BP/Weight  Systolic BP 924 268 341 962 229 94 798  Diastolic BP 83 78 78 85 84 63 88  Wt. (Lbs) 362.12   366 361.4 365.8 349.6  BMI 64.15 kg/m2   64.83 kg/m2 64.02 kg/m2 64.8 kg/m2 61.93 kg/m2       Mixed hyperlipidemia Hyperlipidemia:Low fat diet discussed and encouraged.   Lipid Panel  Lab Results  Component Value Date   CHOL 213 (H) 03/03/2021   HDL 83 03/03/2021   LDLCALC 110 (H) 03/03/2021   TRIG 116 03/03/2021   CHOLHDL 2.6 03/03/2021     Updated lab needed at/ before next visit. Not currently controlled  Morbid obesity with BMI of 50.0-59.9, adult [facility])  Patient re-educated about  the importance of commitment to a  minimum of 150 minutes of exercise per week as able.  The importance of healthy food choices with portion control discussed, as well as eating regularly and within a 12 hour window most days. The need to choose "clean , green" food 50 to 75% of the  time is discussed, as well as to make water the primary drink and set a goal of 64 ounces water daily.       10/11/2021    4:03 PM 09/13/2021    4:31 PM 09/01/2021    9:50 AM  Weight /BMI  Weight 362 lb 1.9 oz 366 lb 361 lb 6.4 oz  Height 5\' 3"  (1.6 m) 5\' 3"  (1.6 m) 5\' 3"  (1.6 m)  BMI 64.15 kg/m2 64.83 kg/m2 64.02 kg/m2      Type 2 diabetes mellitus with diabetic nephropathy, with long-term current use of insulin (HCC) Uncontrolled, but improving, managed by Endo Isabella Matthews is reminded of the importance of commitment to daily physical activity for 30 minutes or more, as able and the need to limit carbohydrate intake to  30 to 60 grams per meal to help with blood sugar control.   The need to take medication as prescribed, test blood sugar as directed, and to call between visits if there is a concern that blood sugar is uncontrolled is also discussed.   Isabella Matthews is reminded of the importance of daily foot exam, annual eye examination, and good blood sugar, blood pressure and cholesterol control.     Latest Ref Rng & Units 10/07/2021    8:48 AM 09/01/2021   10:08 AM 09/01/2021   10:07 AM 05/31/2021    9:35 AM 05/23/2021    3:22 PM  Diabetic Labs  HbA1c 0.0 - 7.0 %  7.0   6.9    Microalbumin mg/L   30     Micro/Creat Ratio    <30     Creatinine 0.57 - 1.00 mg/dL 11/01/2021     07/31/2021       07/23/2021    4:03 PM 09/13/2021    5:10 PM 09/13/2021    4:33 PM 09/13/2021    4:31 PM 09/01/2021    9:50 AM 07/15/2021    9:32 AM 05/31/2021    9:19 AM  BP/Weight  Systolic BP 136 144 160 163 136 94 130  Diastolic BP 83 78 78 85 84 63 88  Wt. (Lbs) 362.12   366 361.4 365.8 349.6  BMI 64.15 kg/m2   64.83 kg/m2 64.02 kg/m2 64.8 kg/m2 61.93 kg/m2      Latest Ref Rng & Units 10/11/2021    4:00 PM 06/06/2021   10:07 AM  Foot/eye exam completion dates  Eye Exam No Retinopathy  No Retinopathy      Foot Form Completion  Done      This result is from an external source.

## 2021-10-11 NOTE — Assessment & Plan Note (Signed)
  Patient re-educated about  the importance of commitment to a  minimum of 150 minutes of exercise per week as able.  The importance of healthy food choices with portion control discussed, as well as eating regularly and within a 12 hour window most days. The need to choose "clean , green" food 50 to 75% of the time is discussed, as well as to make water the primary drink and set a goal of 64 ounces water daily.       10/11/2021    4:03 PM 09/13/2021    4:31 PM 09/01/2021    9:50 AM  Weight /BMI  Weight 362 lb 1.9 oz 366 lb 361 lb 6.4 oz  Height 5\' 3"  (1.6 m) 5\' 3"  (1.6 m) 5\' 3"  (1.6 m)  BMI 64.15 kg/m2 64.83 kg/m2 64.02 kg/m2

## 2021-10-11 NOTE — Patient Instructions (Addendum)
Annual exam in January, call if you need me before  Flu vaccine today  Needs fasting lipid and cBC  and TSH in 1 to 2 weeks, pls re order  Please examine feet daily as sensation is reduced  Think about what you will eat, plan ahead. Choose " clean, green, fresh or frozen" over canned, processed or packaged foods which are more sugary, salty and fatty. 70 to 75% of food eaten should be vegetables and fruit. Three meals at set times with snacks allowed between meals, but they must be fruit or vegetables. Aim to eat over a 12 hour period , example 7 am to 7 pm, and STOP after  your last meal of the day. Drink water,generally about 64 ounces per day, no other drink is as healthy. Fruit juice is best enjoyed in a healthy way, by EATING the fruit.  Thanks for choosing [facility], we consider it a privelige to serve you.

## 2021-10-11 NOTE — Assessment & Plan Note (Signed)
Controlled, no change in medication DASH diet and commitment to daily physical activity for a minimum of 30 minutes discussed and encouraged, as a part of hypertension management. The importance of attaining a healthy weight is also discussed.     10/11/2021    4:03 PM 09/13/2021    5:10 PM 09/13/2021    4:33 PM 09/13/2021    4:31 PM 09/01/2021    9:50 AM 07/15/2021    9:32 AM 05/31/2021    9:19 AM  BP/Weight  Systolic BP 329 924 268 341 962 94 229  Diastolic BP 83 78 78 85 84 63 88  Wt. (Lbs) 362.12   366 361.4 365.8 349.6  BMI 64.15 kg/m2   64.83 kg/m2 64.02 kg/m2 64.8 kg/m2 61.93 kg/m2

## 2021-10-11 NOTE — Assessment & Plan Note (Signed)
Hyperlipidemia:Low fat diet discussed and encouraged.   Lipid Panel  Lab Results  Component Value Date   CHOL 213 (H) 03/03/2021   HDL 83 03/03/2021   LDLCALC 110 (H) 03/03/2021   TRIG 116 03/03/2021   CHOLHDL 2.6 03/03/2021     Updated lab needed at/ before next visit. Not currently controlled

## 2021-10-11 NOTE — Assessment & Plan Note (Signed)
Uncontrolled, but improving, managed by Endo Ms. Rothschild is reminded of the importance of commitment to daily physical activity for 30 minutes or more, as able and the need to limit carbohydrate intake to 30 to 60 grams per meal to help with blood sugar control.   The need to take medication as prescribed, test blood sugar as directed, and to call between visits if there is a concern that blood sugar is uncontrolled is also discussed.   Ms. Bleiler is reminded of the importance of daily foot exam, annual eye examination, and good blood sugar, blood pressure and cholesterol control.     Latest Ref Rng & Units 10/07/2021    8:48 AM 09/01/2021   10:08 AM 09/01/2021   10:07 AM 05/31/2021    9:35 AM 05/23/2021    3:22 PM  Diabetic Labs  HbA1c 0.0 - 7.0 %  7.0   6.9    Microalbumin mg/L   30     Micro/Creat Ratio    <30     Creatinine 0.57 - 1.00 mg/dL 0.93     0.99       10/11/2021    4:03 PM 09/13/2021    5:10 PM 09/13/2021    4:33 PM 09/13/2021    4:31 PM 09/01/2021    9:50 AM 07/15/2021    9:32 AM 05/31/2021    9:19 AM  BP/Weight  Systolic BP 834 196 222 979 892 94 119  Diastolic BP 83 78 78 85 84 63 88  Wt. (Lbs) 362.12   366 361.4 365.8 349.6  BMI 64.15 kg/m2   64.83 kg/m2 64.02 kg/m2 64.8 kg/m2 61.93 kg/m2      Latest Ref Rng & Units 10/11/2021    4:00 PM 06/06/2021   10:07 AM  Foot/eye exam completion dates  Eye Exam No Retinopathy  No Retinopathy      Foot Form Completion  Done      This result is from an external source.

## 2021-12-13 ENCOUNTER — Other Ambulatory Visit: Payer: Self-pay | Admitting: "Endocrinology

## 2021-12-21 ENCOUNTER — Telehealth: Payer: Self-pay | Admitting: "Endocrinology

## 2021-12-21 DIAGNOSIS — Z794 Long term (current) use of insulin: Secondary | ICD-10-CM

## 2021-12-21 DIAGNOSIS — E782 Mixed hyperlipidemia: Secondary | ICD-10-CM

## 2021-12-21 DIAGNOSIS — E559 Vitamin D deficiency, unspecified: Secondary | ICD-10-CM

## 2021-12-21 NOTE — Telephone Encounter (Signed)
Pt accidentally did her labs too soon. Can you add lab orders for pt and let me know when done so I can call pt

## 2021-12-22 NOTE — Telephone Encounter (Signed)
New order in chart 

## 2021-12-22 NOTE — Telephone Encounter (Signed)
Please see message below

## 2021-12-27 LAB — COMPREHENSIVE METABOLIC PANEL
ALT: 28 IU/L (ref 0–32)
AST: 26 IU/L (ref 0–40)
Albumin/Globulin Ratio: 1.5 (ref 1.2–2.2)
Albumin: 4.7 g/dL (ref 3.8–4.9)
Alkaline Phosphatase: 93 IU/L (ref 44–121)
BUN/Creatinine Ratio: 15 (ref 9–23)
BUN: 14 mg/dL (ref 6–24)
Bilirubin Total: 0.6 mg/dL (ref 0.0–1.2)
CO2: 24 mmol/L (ref 20–29)
Calcium: 10.1 mg/dL (ref 8.7–10.2)
Chloride: 100 mmol/L (ref 96–106)
Creatinine, Ser: 0.91 mg/dL (ref 0.57–1.00)
Globulin, Total: 3.1 g/dL (ref 1.5–4.5)
Glucose: 123 mg/dL — ABNORMAL HIGH (ref 70–99)
Potassium: 4.3 mmol/L (ref 3.5–5.2)
Sodium: 141 mmol/L (ref 134–144)
Total Protein: 7.8 g/dL (ref 6.0–8.5)
eGFR: 75 mL/min/{1.73_m2} (ref >59)

## 2021-12-27 LAB — PTH, INTACT AND CALCIUM: PTH: 31 pg/mL (ref 15–65)

## 2021-12-27 LAB — PHOSPHORUS: Phosphorus: 3.6 mg/dL (ref 3.0–4.3)

## 2021-12-27 LAB — MAGNESIUM: Magnesium: 2.1 mg/dL (ref 1.6–2.3)

## 2021-12-27 LAB — VITAMIN D 25 HYDROXY (VIT D DEFICIENCY, FRACTURES): Vit D, 25-Hydroxy: 39.3 ng/mL (ref 30.0–100.0)

## 2022-01-02 ENCOUNTER — Ambulatory Visit (INDEPENDENT_AMBULATORY_CARE_PROVIDER_SITE_OTHER): Payer: No Typology Code available for payment source | Admitting: "Endocrinology

## 2022-01-02 ENCOUNTER — Encounter: Payer: Self-pay | Admitting: "Endocrinology

## 2022-01-02 VITALS — BP 158/92 | HR 68 | Ht 63.0 in | Wt 371.6 lb

## 2022-01-02 DIAGNOSIS — I10 Essential (primary) hypertension: Secondary | ICD-10-CM

## 2022-01-02 DIAGNOSIS — E559 Vitamin D deficiency, unspecified: Secondary | ICD-10-CM | POA: Diagnosis not present

## 2022-01-02 DIAGNOSIS — E1121 Type 2 diabetes mellitus with diabetic nephropathy: Secondary | ICD-10-CM | POA: Diagnosis not present

## 2022-01-02 DIAGNOSIS — E782 Mixed hyperlipidemia: Secondary | ICD-10-CM | POA: Diagnosis not present

## 2022-01-02 DIAGNOSIS — Z794 Long term (current) use of insulin: Secondary | ICD-10-CM

## 2022-01-02 LAB — POCT GLYCOSYLATED HEMOGLOBIN (HGB A1C): HbA1c, POC (controlled diabetic range): 7.5 % — AB (ref 0.0–7.0)

## 2022-01-02 MED ORDER — TRESIBA FLEXTOUCH 200 UNIT/ML ~~LOC~~ SOPN: 70.0000 [IU] | PEN_INJECTOR | Freq: Every day | SUBCUTANEOUS | 0 refills | Status: DC

## 2022-01-02 NOTE — Patient Instructions (Signed)

## 2022-01-02 NOTE — Progress Notes (Signed)
01/02/2022, 9:48 AM                                 Endocrinology follow-up note   Subjective:    Patient ID: Isabella Matthews, female    DOB: 08/23/1970.  Isabella Matthews is being seen in follow-up for the management of currently controlled type 2 diabetes, hyperlipidemia, hypertension. PMD: Kerri Perches, MD.   Past Medical History:  Diagnosis Date   Aortic stenosis, mild    Aortic valve disorders    Diabetes mellitus    Diabetes mellitus without complication (HCC)    Phreesia 02/08/2020   Edema    Essential hypertension    Hypertension    Phreesia 02/08/2020   Morbid obesity (HCC)    Onychomycosis of toenail     Past Surgical History:  Procedure Laterality Date   CESAREAN SECTION     x2    Social History   Socioeconomic History   Marital status: Single    Spouse name: Not on file   Number of children: Not on file   Years of education: Not on file   Highest education level: Not on file  Occupational History   Occupation: CNA - [employer]   Tobacco Use   Smoking status: Former    Packs/day: 0.50    Types: Cigarettes    Quit date: 11/23/2014    Years since quitting: 7.1   Smokeless tobacco: Never  Vaping Use   Vaping Use: Never used  Substance and Sexual Activity   Alcohol use: Yes    Alcohol/week: 0.0 standard drinks of alcohol    Comment: occasionally   Drug use: No   Sexual activity: Not on file  Other Topics Concern   Not on file  Social History Narrative   Not on file   Social Determinants of Health   Financial Resource Strain: Not on file  Food Insecurity: Not on file  Transportation Needs: Not on file  Physical Activity: Not on file  Stress: Not on file  Social Connections: Not on file    Family History  Problem Relation Age of Onset   Hypertension Mother    Hypertension Father    Diabetes Father     Outpatient Encounter Medications as of 01/02/2022  Medication Sig   amLODipine (NORVASC) 5 MG  tablet Take 1 tablet (5 mg total) by mouth daily.   aspirin 81 MG EC tablet Take 1 tablet (81 mg total) by mouth daily. Swallow whole.   Cholecalciferol (VITAMIN D3) 125 MCG (5000 UT) CAPS Take 1 capsule (5,000 Units total) by mouth daily.   Dulaglutide (TRULICITY) 4.5 MG/0.5ML SOPN INJECT 4.5MG  SUBCUTANEOUSLY ONCE WEEKLY AS DIRECTED   furosemide (LASIX) 20 MG tablet Take 1 tablet (20 mg total) by mouth daily.   gabapentin (NEURONTIN) 300 MG capsule TAKE 1 CAPSULE BY MOUTH AT BEDTIME.   glucose blood test strip 1 each by Other route 2 (two) times daily. Use as instructed   insulin degludec (TRESIBA FLEXTOUCH) 200 UNIT/ML FlexTouch Pen Inject 70 Units into the skin at bedtime.   losartan (COZAAR) 100 MG tablet TAKE 1 TABLET  BY MOUTH DAILY.   Multiple Vitamin (MULTIVITAMIN) tablet Take 1 tablet by mouth daily.   rosuvastatin (CRESTOR) 20 MG tablet Take 1 tablet (20 mg total) by mouth daily.   triamcinolone cream (KENALOG) 0.1 % Apply 1 Application topically 2 (two) times daily.   [  DISCONTINUED] insulin degludec (TRESIBA FLEXTOUCH) 200 UNIT/ML FlexTouch Pen Inject 60 Units into the skin at bedtime.   [DISCONTINUED] nystatin (MYCOSTATIN/NYSTOP) powder Apply topically as needed (for rash/irritation). (Patient not taking: Reported on 09/01/2021)   No facility-administered encounter medications on file as of 01/02/2022.    ALLERGIES: Allergies  Allergen Reactions   Penicillins Swelling    Has patient had a PCN reaction causing immediate rash, facial/tongue/throat swelling, SOB or lightheadedness with hypotension: Yes Has patient had a PCN reaction causing severe rash involving mucus membranes or skin necrosis: No Has patient had a PCN reaction that required hospitalization: No Has patient had a PCN reaction occurring within the last 10 years: Yes If all of the above answers are "NO", then may proceed with Cephalosporin use. Leg swelling/redness   Ace Inhibitors Cough   Metformin And Related  Nausea Only   Tramadol Rash    Scant rash on hands  After starting tramadol for knee pain in Jan 16, 2013    VACCINATION STATUS: Immunization History  Administered Date(s) Administered   Influenza Split 10/27/2013   Influenza Whole 12/29/2005, 09/29/2009   Influenza,inj,Quad PF,6+ Mos 10/17/2012, 10/24/2016, 10/01/2018, 10/08/2019, 03/03/2021, 10/11/2021   Moderna Sars-Covid-2 Vaccination 01/28/2019, 12/16/2019, 07/22/2020   Pneumococcal Polysaccharide-23 01/12/2006, 07/09/2015   Tdap 08/12/2010, 09/08/2020   Zoster Recombinat (Shingrix) 07/23/2019, 10/08/2019    Diabetes She presents for her follow-up diabetic visit. She has type 2 diabetes mellitus. Onset time: She was diagnosed at approximate age of 51 years, she did have an episode of gestational diabetes. Her disease course has been worsening. There are no hypoglycemic associated symptoms. Pertinent negatives for hypoglycemia include no confusion, headaches, pallor or seizures. Pertinent negatives for diabetes include no chest pain, no fatigue, no polydipsia, no polyphagia and no polyuria. There are no hypoglycemic complications. Symptoms are worsening. Diabetic complications include peripheral neuropathy. Risk factors for coronary artery disease include diabetes mellitus, dyslipidemia, family history, hypertension, obesity and sedentary lifestyle. Current diabetic treatment includes insulin injections and oral agent (monotherapy). She is compliant with treatment most of the time. Her weight is increasing steadily. She is following a generally unhealthy diet. When asked about meal planning, she reported none. She has not had a previous visit with a dietitian. She rarely participates in exercise. Her home blood glucose trend is increasing steadily. Her breakfast blood glucose range is generally 130-140 mg/dl. Her bedtime blood glucose range is generally 140-180 mg/dl. Her overall blood glucose range is 140-180 mg/dl. Isabella Matthews(Chabeli presents with above  target glycemic profile, point-of-care A1c was 7.5% increasing from 7%.  She did not document any hypoglycemia.  ) An ACE inhibitor/angiotensin II receptor blocker is being taken. Eye exam is current.  Hyperlipidemia This is a chronic problem. The current episode started more than 1 year ago. The problem is controlled. Exacerbating diseases include diabetes and obesity. Pertinent negatives include no chest pain, myalgias or shortness of breath. Current antihyperlipidemic treatment includes statins. Risk factors for coronary artery disease include dyslipidemia, family history, obesity, hypertension, a sedentary lifestyle and diabetes mellitus.  Hypertension This is a chronic problem. The current episode started more than 1 year ago. Pertinent negatives include no chest pain, headaches, palpitations or shortness of breath. Risk factors for coronary artery disease include diabetes mellitus, dyslipidemia, obesity and sedentary lifestyle. Past treatments include angiotensin blockers.     Review of systems: Limited as above.  Objective:    BP (!) 158/92   Pulse 68   Ht 5\' 3"  (1.6 m)   Wt (!) 371 lb  9.6 oz (168.6 kg)   LMP  (LMP Unknown)   BMI 65.83 kg/m   Wt Readings from Last 3 Encounters:  01/02/22 (!) 371 lb 9.6 oz (168.6 kg)  10/11/21 (!) 362 lb 1.9 oz (164.3 kg)  09/13/21 (!) 366 lb (166 kg)     Physical Exam- Limited  Constitutional:  Body mass index is 65.83 kg/m. , not in acute distress, normal state of mind    CMP ( most recent) CMP     Component Value Date/Time   NA 141 12/26/2021 1529   K 4.3 12/26/2021 1529   CL 100 12/26/2021 1529   CO2 24 12/26/2021 1529   GLUCOSE 123 (H) 12/26/2021 1529   GLUCOSE 138 (H) 06/19/2019 0721   BUN 14 12/26/2021 1529   CREATININE 0.91 12/26/2021 1529   CREATININE 0.87 06/19/2019 0721   CALCIUM 10.1 12/26/2021 1529   PROT 7.8 12/26/2021 1529   ALBUMIN 4.7 12/26/2021 1529   AST 26 12/26/2021 1529   ALT 28 12/26/2021 1529   ALKPHOS 93  12/26/2021 1529   BILITOT 0.6 12/26/2021 1529   GFRNONAA 70 02/02/2020 1128   GFRNONAA 101 02/14/2019 0848   GFRAA 81 02/02/2020 1128   GFRAA 117 02/14/2019 0848     Diabetic Labs (most recent): Lab Results  Component Value Date   HGBA1C 7.5 (A) 01/02/2022   HGBA1C 7.0 09/01/2021   HGBA1C 6.9 05/31/2021   MICROALBUR 30 09/01/2021   MICROALBUR 3.0 (H) 07/23/2019   MICROALBUR 33.3 01/27/2017     Lipid Panel ( most recent) Lipid Panel     Component Value Date/Time   CHOL 213 (H) 03/03/2021 1641   TRIG 116 03/03/2021 1641   HDL 83 03/03/2021 1641   CHOLHDL 2.6 03/03/2021 1641   CHOLHDL 2.2 07/23/2019 1010   VLDL 22 03/16/2016 0808   LDLCALC 110 (H) 03/03/2021 1641   LDLCALC 89 07/23/2019 1010   LABVLDL 20 03/03/2021 1641      Lab Results  Component Value Date   TSH 0.838 09/17/2020   TSH 0.98 06/19/2019   TSH 0.88 02/14/2019   TSH 0.96 01/27/2017   TSH 1.18 11/05/2015   TSH 1.603 07/10/2014   TSH 1.152 10/14/2012   TSH 1.202 10/14/2012   TSH 0.437 04/20/2011   TSH 1.274 04/06/2009   FREET4 1.36 09/17/2020   FREET4 1.2 06/19/2019   FREET4 1.2 02/14/2019     Assessment & Plan:   1. Type 2 diabetes mellitus with diabetic nephropathy, with long-term current use of insulin (HCC)   - FELICE HOPE has currently uncontrolled symptomatic type 2 DM since  51 years of age.  Moselle presents with above target glycemic profile, point-of-care A1c was 7.5% increasing from 7%.  She did not document any hypoglycemia.    She has improved her A1c overall from 10.3%.   -Her recent labs were reviewed with her. - I had a long discussion with her about the progressive nature of diabetes and the pathology behind its complications. -her diabetes is complicated by neuropathy, obesity/sedentary life and she remains at a high risk for more acute and chronic complications which include CAD, CVA, CKD, retinopathy, and neuropathy. These are all discussed in detail with her.  - I have  counseled her on diet  and weight management  by adopting a carbohydrate restricted/protein rich diet. Patient is encouraged to switch to  unprocessed or minimally processed     complex starch and increased protein intake (animal or plant source), fruits, and vegetables. -Admittedly, she  has not engaged optimally to lifestyle medicine, however, she wishes to try again.  - she acknowledges that there is a room for improvement in her food and drink choices. - Suggestion is made for her to avoid simple carbohydrates  from her diet including Cakes, Sweet Desserts, Ice Cream, Soda (diet and regular), Sweet Tea, Candies, Chips, Cookies, Store Bought Juices, Alcohol , Artificial Sweeteners,  Coffee Creamer, and "Sugar-free" Products, Lemonade. This will help patient to have more stable blood glucose profile and potentially avoid unintended weight gain.  The following Lifestyle Medicine recommendations according to American College of Lifestyle Medicine  [facility]) were discussed and and offered to patient and she  agrees to start the journey:  A. Whole Foods, Plant-Based Nutrition comprising of fruits and vegetables, plant-based proteins, whole-grain carbohydrates was discussed in detail with the patient.   A list for source of those nutrients were also provided to the patient.  Patient will use only water or unsweetened tea for hydration. B.  The need to stay away from risky substances including alcohol, smoking; obtaining 7 to 9 hours of restorative sleep, at least 150 minutes of moderate intensity exercise weekly, the importance of healthy social connections,  and stress management techniques were discussed. C.  A full color page of  Calorie density of various food groups per pound showing examples of each food groups was provided to the patient.     - she has been scheduled with Norm Salt, RDN, CDE for diabetes education.  - I have approached her with the following individualized plan to manage  her  diabetes and patient agrees:   In light of her presentation with above target fasting glycemic profile, she is advised to increase her Tresiba to 70 units nightly, advised to continue monitoring blood glucose twice a day-daily before breakfast and at bedtime.    - she is warned not to take insulin without proper monitoring per orders. - she is encouraged to call clinic for blood glucose levels less than 70 or above 200 mg /dl.  -She reports GI intolerance from metformin.  She is advised to continue Trulicity 4.5 mg subcutaneously weekly.  - Specific targets for  A1c;  LDL, HDL, Triglycerides, and  Waist Circumference were discussed with the patient.  2) Blood Pressure /Hypertension:  -Her blood pressure is not controlled to target.  She has not taken her blood pressure medications this morning.  She is advised to continue losartan 100 mg p.o. daily, amlodipine 5 mg p.o. daily at breakfast.  Due to mild hyperglycemia, her HCTZ was discontinued during her last visit.    The above detailed lifestyle nutrition will help with hypertension and hyperlipidemia.  3) Lipids/Hyperlipidemia: Uncontrolled, recent fasting lipid panel showed LDL of 110, triglycerides 914.  She is advised to continue Crestor 20 mg p.o. daily at bedtime.    Whole food plant-based diet will help with dyslipidemia as well.      Side effects and precautions discussed with her.  4)  Weight/Diet: Her BMI is 65.83- clearly complicating her diabetes care.   she is  a candidate for weight loss. I discussed with her the fact that loss of 5 - 10% of her  current body weight will have the most impact on her diabetes management.  Exercise, and detailed carbohydrates information provided  -  detailed on discharge instructions.  She is a perfect candidate for whole food plant-based diet described above. -She may benefit from bariatric surgery, will be discussed in more detail on subsequent  visit.  5) vitamin D deficiency She is on ongoing  supplement with vitamin D3 5000 units daily.  She is now vitamin D replete at 46.  She is advised to continue.  6) Chronic Care/Health Maintenance:  -she  is on ACEI/ARB and Statin medications and  is encouraged to initiate and continue to follow up with Ophthalmology, Dentist,  Podiatrist at least yearly or according to recommendations, and advised to   stay away from smoking. I have recommended yearly flu vaccine and pneumonia vaccine at least every 5 years; moderate intensity exercise for up to 150 minutes weekly; and  sleep for at least 7 hours a day.  - she is  advised to maintain close follow up with Kerri Perches, MD for primary care needs, as well as her other providers for optimal and coordinated care.   I spent 41 minutes in the care of the patient today including review of labs from CMP, Lipids, Thyroid Function, Hematology (current and previous including abstractions from other facilities); face-to-face time discussing  her blood glucose readings/logs, discussing hypoglycemia and hyperglycemia episodes and symptoms, medications doses, her options of short and long term treatment based on the latest standards of care / guidelines;  discussion about incorporating lifestyle medicine;  and documenting the encounter. Risk reduction counseling performed per USPSTF guidelines to reduce  obesity and cardiovascular risk factors.     Please refer to Patient Instructions for Blood Glucose Monitoring and Insulin/Medications Dosing Guide"  in media tab for additional information. Please  also refer to " Patient Self Inventory" in the Media  tab for reviewed elements of pertinent patient history.  Celesta Aver participated in the discussions, expressed understanding, and voiced agreement with the above plans.  All questions were answered to her satisfaction. she is encouraged to contact clinic should she have any questions or concerns prior to her return visit.   Follow up plan: - Return in  about 3 months (around 04/03/2022) for Bring Meter/CGM Device/Logs- A1c in Office.  Marquis Lunch, MD [facility] [address] [place], [place] [address] Phone: [phone]  Fax: [phone]    01/02/2022, 9:48 AM  This note was partially dictated with voice recognition software. Similar sounding words can be transcribed inadequately or may not  be corrected upon review.

## 2022-01-10 ENCOUNTER — Other Ambulatory Visit: Payer: Self-pay | Admitting: "Endocrinology

## 2022-01-11 ENCOUNTER — Telehealth: Payer: Self-pay

## 2022-01-11 NOTE — Telephone Encounter (Signed)
Received notice from pt's pharmacy regarding Trulicity 4.5mg  weekly injection needs prior authorization.

## 2022-01-17 ENCOUNTER — Other Ambulatory Visit (HOSPITAL_COMMUNITY): Payer: Self-pay

## 2022-01-17 ENCOUNTER — Telehealth: Payer: Self-pay

## 2022-01-17 NOTE — Telephone Encounter (Signed)
Pharmacy Patient Advocate Encounter   Received notification from [facility] that prior authorization for TRULICITY 4.5MG /.5ML PENS is needed.    PA submitted on 01/17/22 Key [id number] Status is pending  Haze Rushing, CPhT Pharmacy Patient Advocate Specialist Direct Number: [phone] Fax: [phone]

## 2022-01-17 NOTE — Telephone Encounter (Signed)
PA started. See chart notes for update.

## 2022-01-18 ENCOUNTER — Other Ambulatory Visit (HOSPITAL_COMMUNITY): Payer: Self-pay

## 2022-01-18 NOTE — Telephone Encounter (Signed)
Noted  

## 2022-01-19 ENCOUNTER — Other Ambulatory Visit (HOSPITAL_COMMUNITY): Payer: Self-pay

## 2022-01-19 NOTE — Telephone Encounter (Signed)
Pharmacy Patient Advocate Encounter  Prior Authorization for TRULICITY 4.5MG /.5ML PENS has been approved.    PA# [id number] Effective dates: 01/19/22 through 01/20/23  Haze Rushing, CPhT Pharmacy Patient Advocate Specialist Direct Number: [phone] Fax: [phone]

## 2022-01-20 LAB — TSH: TSH: 0.993 u[IU]/mL (ref 0.450–4.500)

## 2022-01-20 LAB — LIPID PANEL
Chol/HDL Ratio: 1.9 ratio (ref 0.0–4.4)
Cholesterol, Total: 162 mg/dL (ref 100–199)
HDL: 86 mg/dL (ref >39)
LDL Chol Calc (NIH): 60 mg/dL (ref 0–99)
Triglycerides: 86 mg/dL (ref 0–149)
VLDL Cholesterol Cal: 16 mg/dL (ref 5–40)

## 2022-01-20 LAB — CBC
Hematocrit: 37 % (ref 34.0–46.6)
Hemoglobin: 12.4 g/dL (ref 11.1–15.9)
MCH: 30.5 pg (ref 26.6–33.0)
MCHC: 33.5 g/dL (ref 31.5–35.7)
MCV: 91 fL (ref 79–97)
Platelets: 256 10*3/uL (ref 150–450)
RBC: 4.06 x10E6/uL (ref 3.77–5.28)
RDW: 12.5 % (ref 11.7–15.4)
WBC: 7.4 10*3/uL (ref 3.4–10.8)

## 2022-01-25 ENCOUNTER — Ambulatory Visit (INDEPENDENT_AMBULATORY_CARE_PROVIDER_SITE_OTHER): Payer: No Typology Code available for payment source | Admitting: Family Medicine

## 2022-01-25 ENCOUNTER — Encounter: Payer: Self-pay | Admitting: Family Medicine

## 2022-01-25 VITALS — BP 168/96 | HR 90 | Ht 63.0 in | Wt 375.0 lb

## 2022-01-25 DIAGNOSIS — I10 Essential (primary) hypertension: Secondary | ICD-10-CM

## 2022-01-25 DIAGNOSIS — Z0001 Encounter for general adult medical examination with abnormal findings: Secondary | ICD-10-CM | POA: Diagnosis not present

## 2022-01-25 DIAGNOSIS — B351 Tinea unguium: Secondary | ICD-10-CM | POA: Diagnosis not present

## 2022-01-25 DIAGNOSIS — Z6841 Body Mass Index (BMI) 40.0 and over, adult: Secondary | ICD-10-CM

## 2022-01-25 MED ORDER — HYDRALAZINE HCL 10 MG PO TABS: 10.0000 mg | ORAL_TABLET | Freq: Three times a day (TID) | ORAL | 0 refills | Status: DC

## 2022-01-25 MED ORDER — AMLODIPINE BESYLATE 10 MG PO TABS: 10.0000 mg | ORAL_TABLET | Freq: Every day | ORAL | 1 refills | Status: DC

## 2022-01-25 MED ORDER — TERBINAFINE HCL 250 MG PO TABS: 250.0000 mg | ORAL_TABLET | Freq: Every day | ORAL | 0 refills | Status: DC

## 2022-01-25 NOTE — Patient Instructions (Signed)
F/U in 6 weeks, re evaluate blood pressure, call if you need me sooner  New for blood pressure is higher dose of amlodipine 10 mg daily and additionally , hydralazine 10 mg one tablet every 8 hours , example 6 am , 2 pm and 10 pm  LIMIT salt in diet as this is raising your blood pressure   New for fungal toenail infection is once daily terbinafine for 12 weeks  Work on increased vegetables and water, smaller portion size for weifght loss  Thanks for choosing [facility], we consider it a privelige to serve you.

## 2022-01-25 NOTE — Progress Notes (Signed)
Isabella Matthews     MRN: [id number]      DOB: [birth year]  HPI: Patient is in for annual physical exam. Uncontrolled HTN is addressed C/o watery eyes , I advised her to d/c the eye liner , c/o smelly feet, normal skin on feet recommend wearing socks with closed shoes and using drying foot powder. Recent labs,  are reviewed. Immunization is reviewed , and  updated if needed.   PE: BP (!) 168/96   Pulse 90   Ht 5\' 3"  (1.6 m)   Wt (!) 375 lb 0.6 oz (170.1 kg)   LMP  (LMP Unknown)   SpO2 94%   BMI 66.44 kg/m   Pleasant  female, alert and oriented x 3, in no cardio-pulmonary distress. Afebrile. HEENT No facial trauma or asymetry. Sinuses non tender.  Extra occullar muscles intact.. External ears normal, . Neck: supple, no adenopathy,JVD or thyromegaly.No bruits.  Chest: Clear to ascultation bilaterally.No crackles or wheezes. Non tender to palpation    Cardiovascular system; Heart sounds normal,  S1 and  S2 ,no S3.  No murmur, or thrill. Apical beat not displaced Peripheral pulses normal.  Abdomen: Soft, non tender  Musculoskeletal exam: Full ROM of spine, hips , shoulders and knees.  deformity ,swelling and  crepitus noted.Right knee No muscle wasting or atrophy.   Neurologic: Cranial nerves 2 to 12 intact. Power, tone ,sensation and reflexes normal throughout. No disturbance in gait. No tremor.  Skin: Intact, no ulceration, erythema , scaling or rash noted. Pigmentation normal throughout, onychomycosis  Psych; Normal mood and affect. Judgement and concentration normal   Assessment & Plan:  Annual visit for general adult medical examination with abnormal findings Annual exam as documented. Counseling done  re healthy lifestyle involving commitment to 150 minutes exercise per week, heart healthy diet, and attaining healthy weight.The importance of adequate sleep also discussed. Regular seat belt use and home safety, is also discussed. Changes in  health habits are decided on by the patient with goals and time frames  set for achieving them. Immunization and cancer screening needs are specifically addressed at this visit.   Malignant hypertension Uncontrolled, increase amlodipine and add hydralazine DASH diet and commitment to daily physical activity for a minimum of 30 minutes discussed and encouraged, as a part of hypertension management. The importance of attaining a healthy weight is also discussed.     01/25/2022    9:14 AM 01/25/2022    8:40 AM 01/25/2022    8:38 AM 01/02/2022    8:58 AM 10/11/2021    4:03 PM 09/13/2021    5:10 PM 09/13/2021    4:33 PM  BP/Weight  Systolic BP 938 182 993 716 967 893 810  Diastolic BP 96 71 93 92 83 78 78  Wt. (Lbs)   375.04 371.6 362.12    BMI   66.44 kg/m2 65.83 kg/m2 64.15 kg/m2         Onychomycosis Terbinafine x 12 weeks  Morbid obesity with BMI of 60.0-69.9, adult ([place])  Patient re-educated about  the importance of commitment to a  minimum of 150 minutes of exercise per week as able.  The importance of healthy food choices with portion control discussed, as well as eating regularly and within a 12 hour window most days. The need to choose "clean , green" food 50 to 75% of the time is discussed, as well as to make water the primary drink and set a goal of 64 ounces water daily.  01/25/2022    8:38 AM 01/02/2022    8:58 AM 10/11/2021    4:03 PM  Weight /BMI  Weight 375 lb 0.6 oz 371 lb 9.6 oz 362 lb 1.9 oz  Height 5\' 3"  (1.6 m) 5\' 3"  (1.6 m) 5\' 3"  (1.6 m)  BMI 66.44 kg/m2 65.83 kg/m2 64.15 kg/m2

## 2022-01-28 ENCOUNTER — Encounter: Payer: Self-pay | Admitting: Family Medicine

## 2022-01-28 DIAGNOSIS — B351 Tinea unguium: Secondary | ICD-10-CM | POA: Insufficient documentation

## 2022-01-28 DIAGNOSIS — Z0001 Encounter for general adult medical examination with abnormal findings: Secondary | ICD-10-CM | POA: Insufficient documentation

## 2022-01-28 NOTE — Assessment & Plan Note (Signed)

## 2022-01-28 NOTE — Assessment & Plan Note (Signed)
Uncontrolled, increase amlodipine and add hydralazine DASH diet and commitment to daily physical activity for a minimum of 30 minutes discussed and encouraged, as a part of hypertension management. The importance of attaining a healthy weight is also discussed.     01/25/2022    9:14 AM 01/25/2022    8:40 AM 01/25/2022    8:38 AM 01/02/2022    8:58 AM 10/11/2021    4:03 PM 09/13/2021    5:10 PM 09/13/2021    4:33 PM  BP/Weight  Systolic BP 177 939 030 092 330 076 226  Diastolic BP 96 71 93 92 83 78 78  Wt. (Lbs)   375.04 371.6 362.12    BMI   66.44 kg/m2 65.83 kg/m2 64.15 kg/m2

## 2022-01-28 NOTE — Assessment & Plan Note (Signed)
  Patient re-educated about  the importance of commitment to a  minimum of 150 minutes of exercise per week as able.  The importance of healthy food choices with portion control discussed, as well as eating regularly and within a 12 hour window most days. The need to choose "clean , green" food 50 to 75% of the time is discussed, as well as to make water the primary drink and set a goal of 64 ounces water daily.       01/25/2022    8:38 AM 01/02/2022    8:58 AM 10/11/2021    4:03 PM  Weight /BMI  Weight 375 lb 0.6 oz 371 lb 9.6 oz 362 lb 1.9 oz  Height 5\' 3"  (1.6 m) 5\' 3"  (1.6 m) 5\' 3"  (1.6 m)  BMI 66.44 kg/m2 65.83 kg/m2 64.15 kg/m2

## 2022-01-28 NOTE — Assessment & Plan Note (Signed)
Terbinafine x 12 weeks 

## 2022-03-16 ENCOUNTER — Encounter: Payer: Self-pay | Admitting: Family Medicine

## 2022-03-16 ENCOUNTER — Ambulatory Visit (INDEPENDENT_AMBULATORY_CARE_PROVIDER_SITE_OTHER): Payer: No Typology Code available for payment source | Admitting: Family Medicine

## 2022-03-16 VITALS — BP 128/82 | HR 100 | Ht 63.0 in | Wt 370.1 lb

## 2022-03-16 DIAGNOSIS — E1159 Type 2 diabetes mellitus with other circulatory complications: Secondary | ICD-10-CM | POA: Diagnosis not present

## 2022-03-16 DIAGNOSIS — E782 Mixed hyperlipidemia: Secondary | ICD-10-CM

## 2022-03-16 DIAGNOSIS — Z1231 Encounter for screening mammogram for malignant neoplasm of breast: Secondary | ICD-10-CM

## 2022-03-16 DIAGNOSIS — I152 Hypertension secondary to endocrine disorders: Secondary | ICD-10-CM

## 2022-03-16 DIAGNOSIS — E1121 Type 2 diabetes mellitus with diabetic nephropathy: Secondary | ICD-10-CM

## 2022-03-16 DIAGNOSIS — I10 Essential (primary) hypertension: Secondary | ICD-10-CM

## 2022-03-16 DIAGNOSIS — Z794 Long term (current) use of insulin: Secondary | ICD-10-CM

## 2022-03-16 DIAGNOSIS — E1169 Type 2 diabetes mellitus with other specified complication: Secondary | ICD-10-CM

## 2022-03-16 DIAGNOSIS — E785 Hyperlipidemia, unspecified: Secondary | ICD-10-CM

## 2022-03-16 MED ORDER — HYDRALAZINE HCL 10 MG PO TABS: 10.0000 mg | ORAL_TABLET | Freq: Three times a day (TID) | ORAL | 3 refills | Status: DC

## 2022-03-16 NOTE — Assessment & Plan Note (Signed)
Uncontrolled, but improving, managed by Endo Ms. Herskovitz is reminded of the importance of commitment to daily physical activity for 30 minutes or more, as able and the need to limit carbohydrate intake to 30 to 60 grams per meal to help with blood sugar control.   The need to take medication as prescribed, test blood sugar as directed, and to call between visits if there is a concern that blood sugar is uncontrolled is also discussed.   Ms. Kosty is reminded of the importance of daily foot exam, annual eye examination, and good blood sugar, blood pressure and cholesterol control.     Latest Ref Rng & Units 01/19/2022    9:05 AM 01/02/2022    9:10 AM 12/26/2021    3:29 PM 10/07/2021    8:48 AM 09/01/2021   10:08 AM  Diabetic Labs  HbA1c 0.0 - 7.0 %  7.5    7.0   Chol 100 - 199 mg/dL 162       HDL >39 mg/dL 86       Calc LDL 0 - 99 mg/dL 60       Triglycerides 0 - 149 mg/dL 86       Creatinine 0.57 - 1.00 mg/dL   0.91  0.93        03/16/2022    9:17 AM 03/16/2022    9:16 AM 03/16/2022    8:48 AM 03/16/2022    8:46 AM 01/25/2022    9:14 AM 01/25/2022    8:40 AM 01/25/2022    8:38 AM  BP/Weight  Systolic BP 0000000 [id number]  Diastolic BP 82 82 82 66 96 71 93  Wt. (Lbs)    370.08   375.04  BMI    65.56 kg/m2   66.44 kg/m2      Latest Ref Rng & Units 10/11/2021    4:00 PM 06/06/2021   10:07 AM  Foot/eye exam completion dates  Eye Exam No Retinopathy  No Retinopathy      Foot Form Completion  Done      This result is from an external source.

## 2022-03-16 NOTE — Assessment & Plan Note (Signed)
Deteriorated. Patient re-educated about  the importance of commitment to a  minimum of 150 minutes of exercise per week.  The importance of healthy food choices with portion control discussed. Encouraged to start a food diary, count calories and to consider  joining a support group. Sample diet sheets offered. Goals set by the patient for the next several months.      03/16/2022    8:46 AM 01/25/2022    8:38 AM 01/02/2022    8:58 AM  Weight /BMI  Weight 370 lb 1.3 oz 375 lb 0.6 oz 371 lb 9.6 oz  Height 5' 3"$  (1.6 m) 5' 3"$  (1.6 m) 5' 3"$  (1.6 m)  BMI 65.56 kg/m2 66.44 kg/m2 65.83 kg/m2

## 2022-03-16 NOTE — Patient Instructions (Addendum)
F/U in 4 months, call if you need me sooner.  Blood pressure is well-controlled on current medications continue as before.  Fasting lipid CMP and EGFR 5 to 7 days before next visit.  Please schedule mammogram at checkout.  New goal is weight loss at roughly 4 pounds per month over the next 4 months, this will improve your overall health.  Thanks for choosing [facility], we consider it a privelige to serve you.

## 2022-03-16 NOTE — Assessment & Plan Note (Signed)
Controlled, no change in medication DASH diet and commitment to daily physical activity for a minimum of 30 minutes discussed and encouraged, as a part of hypertension management. The importance of attaining a healthy weight is also discussed.     03/16/2022    9:17 AM 03/16/2022    9:16 AM 03/16/2022    8:48 AM 03/16/2022    8:46 AM 01/25/2022    9:14 AM 01/25/2022    8:40 AM 01/25/2022    8:38 AM  BP/Weight  Systolic BP 0000000 [id number] 0000000 [id number]  Diastolic BP 82 82 82 66 96 71 93  Wt. (Lbs)    370.08   375.04  BMI    65.56 kg/m2   66.44 kg/m2

## 2022-03-16 NOTE — Assessment & Plan Note (Signed)
Hyperlipidemia:Low fat diet discussed and encouraged.   Lipid Panel  Lab Results  Component Value Date   CHOL 162 01/19/2022   HDL 86 01/19/2022   LDLCALC 60 01/19/2022   TRIG 86 01/19/2022   CHOLHDL 1.9 01/19/2022

## 2022-03-16 NOTE — Progress Notes (Signed)
Isabella Matthews     MRN: [id number]      DOB: 1970-11-08   HPI Isabella Matthews is here for follow up and re-evaluation of chronic medical conditions, medication management and review of any available recent lab and radiology data.  Preventive health is updated, specifically  Cancer screening and Immunization.   Questions or concerns regarding consultations or procedures which the PT has had in the interim are  addressed. The PT denies any adverse reactions to current medications since the last visit.  There are no new concerns.  There are no specific complaints   ROS Denies recent fever or chills. Denies sinus pressure, nasal congestion, ear pain or sore throat. Denies chest congestion, productive cough or wheezing. Denies chest pains, palpitations and leg swelling Denies abdominal pain, nausea, vomiting,diarrhea or constipation.   Denies dysuria, frequency, hesitancy or incontinence. Denies joint pain, swelling and limitation in mobility. Denies headaches, seizures, numbness, or tingling. Denies depression, anxiety or insomnia. Denies skin break down or rash.   PE  BP 128/82   Pulse 100   Ht 5' 3"$  (1.6 m)   Wt (!) 370 lb 1.3 oz (167.9 kg)   LMP  (LMP Unknown)   SpO2 94%   BMI 65.56 kg/m   Patient alert and oriented and in no cardiopulmonary distress.  HEENT: No facial asymmetry, EOMI,     Neck supple .  Chest: Clear to auscultation bilaterally.  CVS: S1, S2 no murmurs, no S3.Regular rate.  ABD: Soft non tender.   Ext: No edema  MS: Adequate ROM spine, shoulders, hips and knees.  Skin: Intact, no ulcerations or rash noted.  Psych: Good eye contact, normal affect. Memory intact not anxious or depressed appearing.  CNS: CN 2-12 intact, power,  normal throughout.no focal deficits noted.   Assessment & Plan  Hypertension associated with type 2 diabetes mellitus (HCC) Controlled, no change in medication DASH diet and commitment to daily physical activity for  a minimum of 30 minutes discussed and encouraged, as a part of hypertension management. The importance of attaining a healthy weight is also discussed.     03/16/2022    9:17 AM 03/16/2022    9:16 AM 03/16/2022    8:48 AM 03/16/2022    8:46 AM 01/25/2022    9:14 AM 01/25/2022    8:40 AM 01/25/2022    8:38 AM  BP/Weight  Systolic BP 0000000 [id number] 0000000 [id number]  Diastolic BP 82 82 82 66 96 71 93  Wt. (Lbs)    370.08   375.04  BMI    65.56 kg/m2   66.44 kg/m2       Type 2 diabetes mellitus with diabetic nephropathy, with long-term current use of insulin (HCC) Uncontrolled, but improving, managed by Endo Isabella Matthews is reminded of the importance of commitment to daily physical activity for 30 minutes or more, as able and the need to limit carbohydrate intake to 30 to 60 grams per meal to help with blood sugar control.   The need to take medication as prescribed, test blood sugar as directed, and to call between visits if there is a concern that blood sugar is uncontrolled is also discussed.   Isabella Matthews is reminded of the importance of daily foot exam, annual eye examination, and good blood sugar, blood pressure and cholesterol control.     Latest Ref Rng & Units 01/19/2022    9:05 AM 01/02/2022    9:10 AM 12/26/2021    3:29 PM 10/07/2021  8:48 AM 09/01/2021   10:08 AM  Diabetic Labs  HbA1c 0.0 - 7.0 %  7.5    7.0   Chol 100 - 199 mg/dL 162       HDL >39 mg/dL 86       Calc LDL 0 - 99 mg/dL 60       Triglycerides 0 - 149 mg/dL 86       Creatinine 0.57 - 1.00 mg/dL   0.91  0.93        03/16/2022    9:17 AM 03/16/2022    9:16 AM 03/16/2022    8:48 AM 03/16/2022    8:46 AM 01/25/2022    9:14 AM 01/25/2022    8:40 AM 01/25/2022    8:38 AM  BP/Weight  Systolic BP 0000000 [id number] 0000000 0000000 [id number]  Diastolic BP 82 82 82 66 96 71 93  Wt. (Lbs)    370.08   375.04  BMI    65.56 kg/m2   66.44 kg/m2      Latest Ref Rng & Units 10/11/2021    4:00 PM 06/06/2021   10:07 AM  Foot/eye exam  completion dates  Eye Exam No Retinopathy  No Retinopathy      Foot Form Completion  Done      This result is from an external source.        MORBID OBESITY Deteriorated. Patient re-educated about  the importance of commitment to a  minimum of 150 minutes of exercise per week.  The importance of healthy food choices with portion control discussed. Encouraged to start a food diary, count calories and to consider  joining a support group. Sample diet sheets offered. Goals set by the patient for the next several months.      03/16/2022    8:46 AM 01/25/2022    8:38 AM 01/02/2022    8:58 AM  Weight /BMI  Weight 370 lb 1.3 oz 375 lb 0.6 oz 371 lb 9.6 oz  Height 5' 3"$  (1.6 m) 5' 3"$  (1.6 m) 5' 3"$  (1.6 m)  BMI 65.56 kg/m2 66.44 kg/m2 65.83 kg/m2       Hyperlipidemia associated with type 2 diabetes mellitus ([place]) Hyperlipidemia:Low fat diet discussed and encouraged.   Lipid Panel  Lab Results  Component Value Date   CHOL 162 01/19/2022   HDL 86 01/19/2022   LDLCALC 60 01/19/2022   TRIG 86 01/19/2022   CHOLHDL 1.9 01/19/2022

## 2022-04-03 ENCOUNTER — Ambulatory Visit: Payer: No Typology Code available for payment source | Admitting: "Endocrinology

## 2022-04-03 ENCOUNTER — Encounter: Payer: Self-pay | Admitting: "Endocrinology

## 2022-04-03 VITALS — BP 152/88 | HR 88 | Ht 63.0 in | Wt 372.4 lb

## 2022-04-03 DIAGNOSIS — Z794 Long term (current) use of insulin: Secondary | ICD-10-CM

## 2022-04-03 DIAGNOSIS — E559 Vitamin D deficiency, unspecified: Secondary | ICD-10-CM | POA: Diagnosis not present

## 2022-04-03 DIAGNOSIS — I10 Essential (primary) hypertension: Secondary | ICD-10-CM

## 2022-04-03 DIAGNOSIS — E782 Mixed hyperlipidemia: Secondary | ICD-10-CM

## 2022-04-03 DIAGNOSIS — E1121 Type 2 diabetes mellitus with diabetic nephropathy: Secondary | ICD-10-CM

## 2022-04-03 NOTE — Patient Instructions (Signed)

## 2022-04-03 NOTE — Progress Notes (Unsigned)
04/03/2022, 11:12 AM                                 Endocrinology follow-up note   Subjective:    Patient ID: Isabella Matthews, female    DOB: [birth year].  Isabella Matthews is being seen in follow-up for the management of currently controlled type 2 diabetes, hyperlipidemia, hypertension. PMD: Fayrene Helper, MD.   Past Medical History:  Diagnosis Date   Aortic stenosis, mild    Aortic valve disorders    Diabetes mellitus    Diabetes mellitus without complication ([place])    Phreesia 02/08/2020   Edema    Essential hypertension    Hypertension    Phreesia 02/08/2020   Morbid obesity ([place])    Onychomycosis of toenail     Past Surgical History:  Procedure Laterality Date   CESAREAN SECTION     x2    Social History   Socioeconomic History   Marital status: Single    Spouse name: Not on file   Number of children: Not on file   Years of education: Not on file   Highest education level: Not on file  Occupational History   Occupation: CNA - [employer]   Tobacco Use   Smoking status: Former    Packs/day: 0.50    Types: Cigarettes    Quit date: 11/23/2014    Years since quitting: 7.3   Smokeless tobacco: Never  Vaping Use   Vaping Use: Never used  Substance and Sexual Activity   Alcohol use: Yes    Alcohol/week: 0.0 standard drinks of alcohol    Comment: occasionally   Drug use: No   Sexual activity: Not on file  Other Topics Concern   Not on file  Social History Narrative   Not on file   Social Determinants of Health   Financial Resource Strain: Not on file  Food Insecurity: Not on file  Transportation Needs: Not on file  Physical Activity: Not on file  Stress: Not on file  Social Connections: Not on file    Family History  Problem Relation Age of Onset   Hypertension Mother    Hypertension Father    Diabetes Father     Outpatient Encounter Medications as of 04/03/2022  Medication Sig   amLODipine (NORVASC) 10 MG  tablet Take 1 tablet (10 mg total) by mouth daily.   aspirin 81 MG EC tablet Take 1 tablet (81 mg total) by mouth daily. Swallow whole.   Cholecalciferol (VITAMIN D3) 125 MCG (5000 UT) CAPS Take 1 capsule (5,000 Units total) by mouth daily.   furosemide (LASIX) 20 MG tablet Take 1 tablet (20 mg total) by mouth daily.   gabapentin (NEURONTIN) 300 MG capsule TAKE 1 CAPSULE BY MOUTH AT BEDTIME.   glucose blood test strip 1 each by Other route 2 (two) times daily. Use as instructed   hydrALAZINE (APRESOLINE) 10 MG tablet Take 1 tablet (10 mg total) by mouth 3 (three) times daily.   insulin degludec (TRESIBA FLEXTOUCH) 200 UNIT/ML FlexTouch Pen Inject 70 Units into the skin at bedtime.   losartan (COZAAR) 100 MG tablet TAKE 1 TABLET  BY MOUTH DAILY.   Multiple Vitamin (MULTIVITAMIN) tablet Take 1 tablet by mouth daily.   rosuvastatin (CRESTOR) 20 MG tablet Take 1 tablet (20 mg total) by mouth daily.   terbinafine (LAMISIL) 250 MG tablet Take 1 tablet (250  mg total) by mouth daily.   triamcinolone cream (KENALOG) 0.1 % Apply 1 Application topically 2 (two) times daily.   TRULICITY 4.5 0000000 SOPN INJECT 4.'5MG'$  SUBCUTANEOUSLY ONCE WEEKLY AS DIRECTED   No facility-administered encounter medications on file as of 04/03/2022.    ALLERGIES: Allergies  Allergen Reactions   Penicillins Swelling    Has patient had a PCN reaction causing immediate rash, facial/tongue/throat swelling, SOB or lightheadedness with hypotension: Yes Has patient had a PCN reaction causing severe rash involving mucus membranes or skin necrosis: No Has patient had a PCN reaction that required hospitalization: No Has patient had a PCN reaction occurring within the last 10 years: Yes If all of the above answers are "NO", then may proceed with Cephalosporin use. Leg swelling/redness   Ace Inhibitors Cough   Metformin And Related Nausea Only   Tramadol Rash    Scant rash on hands  After starting tramadol for knee pain in Jan 16, 2013    VACCINATION STATUS: Immunization History  Administered Date(s) Administered   Influenza Split 10/27/2013   Influenza Whole 12/29/2005, 09/29/2009   Influenza,inj,Quad PF,6+ Mos 10/17/2012, 10/24/2016, 10/01/2018, 10/08/2019, 03/03/2021, 10/11/2021   Moderna Sars-Covid-2 Vaccination 01/28/2019, 12/16/2019, 07/22/2020   Pneumococcal Polysaccharide-23 01/12/2006, 07/09/2015   Tdap 08/12/2010, 09/08/2020   Zoster Recombinat (Shingrix) 07/23/2019, 10/08/2019    Diabetes She presents for her follow-up diabetic visit. She has type 2 diabetes mellitus. Onset time: She was diagnosed at approximate age of 60 years, she did have an episode of gestational diabetes. Her disease course has been improving. There are no hypoglycemic associated symptoms. Pertinent negatives for hypoglycemia include no confusion, headaches, pallor or seizures. Pertinent negatives for diabetes include no chest pain, no fatigue, no polydipsia, no polyphagia and no polyuria. There are no hypoglycemic complications. Symptoms are improving. Diabetic complications include peripheral neuropathy. Risk factors for coronary artery disease include diabetes mellitus, dyslipidemia, family history, hypertension, obesity and sedentary lifestyle. Current diabetic treatment includes insulin injections and oral agent (monotherapy). She is compliant with treatment most of the time. Her weight is fluctuating minimally. She is following a generally unhealthy diet. When asked about meal planning, she reported none. She has not had a previous visit with a dietitian. She rarely participates in exercise. Her home blood glucose trend is decreasing steadily. Her breakfast blood glucose range is generally 110-130 mg/dl. Her bedtime blood glucose range is generally 130-140 mg/dl. Her overall blood glucose range is 130-140 mg/dl. Isabella Matthews presents with controlled glycemic profile to target.  Her point-of-care A1c is 6.8% improving from 7.5% during her last  visit.  She did not document any sustained hypoglycemia.   ) An ACE inhibitor/angiotensin II receptor blocker is being taken. Eye exam is current.  Hyperlipidemia This is a chronic problem. The current episode started more than 1 year ago. The problem is controlled. Exacerbating diseases include diabetes and obesity. Pertinent negatives include no chest pain, myalgias or shortness of breath. Current antihyperlipidemic treatment includes statins. Risk factors for coronary artery disease include dyslipidemia, family history, obesity, hypertension, a sedentary lifestyle and diabetes mellitus.  Hypertension This is a chronic problem. The current episode started more than 1 year ago. Pertinent negatives include no chest pain, headaches, palpitations or shortness of breath. Risk factors for coronary artery disease include diabetes mellitus, dyslipidemia, obesity and sedentary lifestyle. Past treatments include angiotensin blockers.     Review of systems: Limited as above.  Objective:    BP (!) 152/88 Comment: 132/86 L arm manuel cuff  Pulse 88  Ht '5\' 3"'$  (1.6 m)   Wt (!) 372 lb 6.4 oz (168.9 kg)   LMP  (LMP Unknown)   BMI 65.97 kg/m   Wt Readings from Last 3 Encounters:  04/03/22 (!) 372 lb 6.4 oz (168.9 kg)  03/16/22 (!) 370 lb 1.3 oz (167.9 kg)  01/25/22 (!) 375 lb 0.6 oz (170.1 kg)     Physical Exam- Limited  Constitutional:  Body mass index is 65.97 kg/m. , not in acute distress, normal state of mind    CMP ( most recent) CMP     Component Value Date/Time   NA 141 12/26/2021 1529   K 4.3 12/26/2021 1529   CL 100 12/26/2021 1529   CO2 24 12/26/2021 1529   GLUCOSE 123 (H) 12/26/2021 1529   GLUCOSE 138 (H) 06/19/2019 0721   BUN 14 12/26/2021 1529   CREATININE 0.91 12/26/2021 1529   CREATININE 0.87 06/19/2019 0721   CALCIUM 10.1 12/26/2021 1529   PROT 7.8 12/26/2021 1529   ALBUMIN 4.7 12/26/2021 1529   AST 26 12/26/2021 1529   ALT 28 12/26/2021 1529   ALKPHOS 93 12/26/2021  1529   BILITOT 0.6 12/26/2021 1529   GFRNONAA 70 02/02/2020 1128   GFRNONAA 101 02/14/2019 0848   GFRAA 81 02/02/2020 1128   GFRAA 117 02/14/2019 0848     Diabetic Labs (most recent): Lab Results  Component Value Date   HGBA1C 7.5 (A) 01/02/2022   HGBA1C 7.0 09/01/2021   HGBA1C 6.9 05/31/2021   MICROALBUR 30 09/01/2021   MICROALBUR 3.0 (H) 07/23/2019   MICROALBUR 33.3 01/27/2017     Lipid Panel ( most recent) Lipid Panel     Component Value Date/Time   CHOL 162 01/19/2022 0905   TRIG 86 01/19/2022 0905   HDL 86 01/19/2022 0905   CHOLHDL 1.9 01/19/2022 0905   CHOLHDL 2.2 07/23/2019 1010   VLDL 22 03/16/2016 0808   LDLCALC 60 01/19/2022 0905   LDLCALC 89 07/23/2019 1010   LABVLDL 16 01/19/2022 0905      Lab Results  Component Value Date   TSH 0.993 01/19/2022   TSH 0.838 09/17/2020   TSH 0.98 06/19/2019   TSH 0.88 02/14/2019   TSH 0.96 01/27/2017   TSH 1.18 11/05/2015   TSH 1.603 07/10/2014   TSH 1.152 10/14/2012   TSH 1.202 10/14/2012   TSH 0.437 04/20/2011   FREET4 1.36 09/17/2020   FREET4 1.2 06/19/2019   FREET4 1.2 02/14/2019     Assessment & Plan:   1. Type 2 diabetes mellitus with diabetic nephropathy, with long-term current use of insulin ([place])   - Isabella Matthews has currently uncontrolled symptomatic type 2 DM since  52 years of age.  Isabella Matthews presents with controlled glycemic profile to target.  Her point-of-care A1c is 6.8% improving from 7.5% during her last visit.  She did not document any sustained hypoglycemia.  She is improving her A1c overall from 10.3%.  -Her recent labs were reviewed with her. - I had a long discussion with her about the progressive nature of diabetes and the pathology behind its complications. -her diabetes is complicated by neuropathy, obesity/sedentary life and she remains at a high risk for more acute and chronic complications which include CAD, CVA, CKD, retinopathy, and neuropathy. These are all discussed in detail  with her.  - I have counseled her on diet  and weight management  by adopting a carbohydrate restricted/protein rich diet. Patient is encouraged to switch to  unprocessed or minimally processed  complex starch and increased protein intake (animal or plant source), fruits, and vegetables. -Admittedly, she has not engaged optimally to lifestyle medicine, however, she wishes to try again.  - she acknowledges that there is a room for improvement in her food and drink choices. - Suggestion is made for her to avoid simple carbohydrates  from her diet including Cakes, Sweet Desserts, Ice Cream, Soda (diet and regular), Sweet Tea, Candies, Chips, Cookies, Store Bought Juices, Alcohol , Artificial Sweeteners,  Coffee Creamer, and "Sugar-free" Products, Lemonade. This will help patient to have more stable blood glucose profile and potentially avoid unintended weight gain.  The following Lifestyle Medicine recommendations according to [facility]) were discussed and and offered to patient and she  agrees to start the journey:  A. Whole Foods, Plant-Based Nutrition comprising of fruits and vegetables, plant-based proteins, whole-grain carbohydrates was discussed in detail with the patient.   A list for source of those nutrients were also provided to the patient.  Patient will use only water or unsweetened tea for hydration. B.  The need to stay away from risky substances including alcohol, smoking; obtaining 7 to 9 hours of restorative sleep, at least 150 minutes of moderate intensity exercise weekly, the importance of healthy social connections,  and stress management techniques were discussed. C.  A full color page of  Calorie density of various food groups per pound showing examples of each food groups was provided to the patient.   - she has been scheduled with Jearld Fenton, RDN, CDE for diabetes education.  - I have approached her with the following individualized  plan to manage  her diabetes and patient agrees:   In light of her presentation with near target fasting and postprandial glycemic profile, she will not need prandial insulin for now.  She is advised to continue Tresiba 70 units nightly, associated with monitoring of blood glucose twice a day-daily before breakfast and at bedtime.   - she is warned not to take insulin without proper monitoring per orders. - she is encouraged to call clinic for blood glucose levels less than 70 or above 200 mg /dl.  -She reports GI intolerance from metformin.  She is tolerating and benefiting from Trulicity.  She is advised to continue Trulicity 4.5 mg subcutaneously weekly.    - Specific targets for  A1c;  LDL, HDL, Triglycerides, and  Waist Circumference were discussed with the patient.  2) Blood Pressure /Hypertension:  -Her blood pressure is not controlled to target.   She is advised to continue losartan 100 mg p.o. daily, amlodipine 5 mg p.o. daily at breakfast.  She also has hydralazine 10 mg p.o. 3 times daily.  The above detailed lifestyle nutrition will help with hypertension and hyperlipidemia.  3) Lipids/Hyperlipidemia: Uncontrolled, recent fasting lipid panel showed improved LDL of 60 from 110.  She is advised to continue Crestor 20 mg p.o. daily at bedtime.     Whole food plant-based diet will help with dyslipidemia as well.     Side effects and precautions discussed with her.  4)  Weight/Diet: Her BMI is [id number]-- clearly complicating her diabetes care.   she is  a candidate for weight loss. I discussed with her the fact that loss of 5 - 10% of her  current body weight will have the most impact on her diabetes management.  Exercise, and detailed carbohydrates information provided  -  detailed on discharge instructions.  She is a perfect candidate for whole food  plant-based diet described above. -She may benefit from bariatric surgery, will be discussed in more detail on subsequent visit.  5) vitamin D  deficiency She is on ongoing supplement with vitamin D3 5000 units daily.  She is now vitamin D replete at 54.  She is advised to continue.  6) Chronic Care/Health Maintenance:  -she  is on ACEI/ARB and Statin medications and  is encouraged to initiate and continue to follow up with Ophthalmology, Dentist,  Podiatrist at least yearly or according to recommendations, and advised to   stay away from smoking. I have recommended yearly flu vaccine and pneumonia vaccine at least every 5 years; moderate intensity exercise for up to 150 minutes weekly; and  sleep for at least 7 hours a day.  - she is  advised to maintain close follow up with Fayrene Helper, MD for primary care needs, as well as her other providers for optimal and coordinated care.   I spent  27  minutes in the care of the patient today including review of labs from [place], Lipids, Thyroid Function, Hematology (current and previous including abstractions from other facilities); face-to-face time discussing  her blood glucose readings/logs, discussing hypoglycemia and hyperglycemia episodes and symptoms, medications doses, her options of short and long term treatment based on the latest standards of care / guidelines;  discussion about incorporating lifestyle medicine;  and documenting the encounter. Risk reduction counseling performed per USPSTF guidelines to reduce  obesity and cardiovascular risk factors.     Please refer to Patient Instructions for Blood Glucose Monitoring and Insulin/Medications Dosing Guide"  in media tab for additional information. Please  also refer to " Patient Self Inventory" in the Media  tab for reviewed elements of pertinent patient history.  Isabella Matthews participated in the discussions, expressed understanding, and voiced agreement with the above plans.  All questions were answered to her satisfaction. she is encouraged to contact clinic should she have any questions or concerns prior to her return  visit.   Follow up plan: - Return in about 4 months (around 08/03/2022) for F/U with Pre-visit Labs, Meter/CGM/Logs, A1c here.  Glade Lloyd, MD [facility] [address] [place], [place] [address] Phone: [phone]  Fax: [phone]    04/03/2022, 11:12 AM  This note was partially dictated with voice recognition software. Similar sounding words can be transcribed inadequately or may not  be corrected upon review.

## 2022-04-05 LAB — POCT GLYCOSYLATED HEMOGLOBIN (HGB A1C): HbA1c, POC (controlled diabetic range): 6.8 % (ref 0.0–7.0)

## 2022-04-12 ENCOUNTER — Ambulatory Visit (HOSPITAL_COMMUNITY)
Admission: RE | Admit: 2022-04-12 | Discharge: 2022-04-12 | Disposition: A | Discharge status: Home / Self Care | Payer: PRIVATE HEALTH INSURANCE | Source: Ambulatory Visit | Attending: Family Medicine | Admitting: Family Medicine

## 2022-04-12 DIAGNOSIS — Z1231 Encounter for screening mammogram for malignant neoplasm of breast: Secondary | ICD-10-CM | POA: Insufficient documentation

## 2022-04-17 ENCOUNTER — Other Ambulatory Visit: Payer: Self-pay | Admitting: "Endocrinology

## 2022-04-17 DIAGNOSIS — E1121 Type 2 diabetes mellitus with diabetic nephropathy: Secondary | ICD-10-CM

## 2022-06-30 ENCOUNTER — Other Ambulatory Visit: Payer: Self-pay | Admitting: Family Medicine

## 2022-07-05 ENCOUNTER — Other Ambulatory Visit: Payer: Self-pay | Admitting: "Endocrinology

## 2022-07-05 DIAGNOSIS — Z794 Long term (current) use of insulin: Secondary | ICD-10-CM

## 2022-07-12 ENCOUNTER — Other Ambulatory Visit: Payer: Self-pay | Admitting: Family Medicine

## 2022-07-14 LAB — CMP14+EGFR
ALT: 21 IU/L (ref 0–32)
AST: 23 IU/L (ref 0–40)
Albumin: 4.4 g/dL (ref 3.8–4.9)
Alkaline Phosphatase: 87 IU/L (ref 44–121)
BUN/Creatinine Ratio: 18 (ref 9–23)
BUN: 12 mg/dL (ref 6–24)
Bilirubin Total: 0.4 mg/dL (ref 0.0–1.2)
CO2: 26 mmol/L (ref 20–29)
Calcium: 10.3 mg/dL — ABNORMAL HIGH (ref 8.7–10.2)
Chloride: 104 mmol/L (ref 96–106)
Creatinine, Ser: 0.68 mg/dL (ref 0.57–1.00)
Globulin, Total: 3.1 g/dL (ref 1.5–4.5)
Glucose: 112 mg/dL — ABNORMAL HIGH (ref 70–99)
Potassium: 4.9 mmol/L (ref 3.5–5.2)
Sodium: 143 mmol/L (ref 134–144)
Total Protein: 7.5 g/dL (ref 6.0–8.5)
eGFR: 104 mL/min/{1.73_m2} (ref >59)

## 2022-07-14 LAB — LIPID PANEL
Chol/HDL Ratio: 2 ratio (ref 0.0–4.4)
Cholesterol, Total: 181 mg/dL (ref 100–199)
HDL: 89 mg/dL (ref >39)
LDL Chol Calc (NIH): 74 mg/dL (ref 0–99)
Triglycerides: 100 mg/dL (ref 0–149)
VLDL Cholesterol Cal: 18 mg/dL (ref 5–40)

## 2022-07-18 ENCOUNTER — Ambulatory Visit (INDEPENDENT_AMBULATORY_CARE_PROVIDER_SITE_OTHER): Payer: No Typology Code available for payment source | Admitting: Family Medicine

## 2022-07-18 ENCOUNTER — Encounter: Payer: Self-pay | Admitting: Family Medicine

## 2022-07-18 VITALS — BP 140/84 | HR 91 | Ht 63.0 in | Wt 357.1 lb

## 2022-07-18 DIAGNOSIS — E1121 Type 2 diabetes mellitus with diabetic nephropathy: Secondary | ICD-10-CM | POA: Diagnosis not present

## 2022-07-18 DIAGNOSIS — E559 Vitamin D deficiency, unspecified: Secondary | ICD-10-CM

## 2022-07-18 DIAGNOSIS — I872 Venous insufficiency (chronic) (peripheral): Secondary | ICD-10-CM

## 2022-07-18 DIAGNOSIS — Z1211 Encounter for screening for malignant neoplasm of colon: Secondary | ICD-10-CM

## 2022-07-18 DIAGNOSIS — Z794 Long term (current) use of insulin: Secondary | ICD-10-CM

## 2022-07-18 DIAGNOSIS — E785 Hyperlipidemia, unspecified: Secondary | ICD-10-CM

## 2022-07-18 DIAGNOSIS — E1159 Type 2 diabetes mellitus with other circulatory complications: Secondary | ICD-10-CM | POA: Diagnosis not present

## 2022-07-18 DIAGNOSIS — I152 Hypertension secondary to endocrine disorders: Secondary | ICD-10-CM

## 2022-07-18 NOTE — Patient Instructions (Addendum)
F/U in 14 weeks, with foot exam, call if you need m sooner  CONGRATS on meeting SO MANY health goals   No med changes  Please commit to regular exercise even outside of work as well as reducing intake of sweets and white foods.  You have lost 15 pounds in the past 3 months which is excellent and your blood sugar is very well-controlled.  Your blood pressure is also improving but still not at goal.  Losing an additional 15 pounds will absolutely lower your blood pressure.  Nonfasting CBC TSH vitamin D and Chem-7 as well as urine ACR to be done 3 to 5 days before your next visit.  Nurse please order Cologuard testing for patient.  Thanks for choosing [facility], we consider it a privelige to serve you.

## 2022-07-18 NOTE — Assessment & Plan Note (Signed)
Leg elevation, and diuretic as needed, also compression hose

## 2022-07-18 NOTE — Assessment & Plan Note (Signed)
Marked improvement, patient is encouraged to keep this up.  Patient re-educated about  the importance of commitment to a  minimum of 150 minutes of exercise per week as able.  The importance of healthy food choices with portion control discussed, as well as eating regularly and within a 12 hour window most days. The need to choose "clean , green" food 50 to 75% of the time is discussed, as well as to make water the primary drink and set a goal of 64 ounces water daily.       07/18/2022    8:08 AM 04/03/2022    9:15 AM 03/16/2022    8:46 AM  Weight /BMI  Weight 357 lb 1.9 oz 372 lb 6.4 oz 370 lb 1.3 oz  Height 5\' 3"  (1.6 m) 5\' 3"  (1.6 m) 5\' 3"  (1.6 m)  BMI 63.26 kg/m2 65.97 kg/m2 65.56 kg/m2

## 2022-07-18 NOTE — Assessment & Plan Note (Signed)
Improved but not at goal, no medication changes anticipate an additional 15 pound weight loss in the next 4 months which will be wonderful.  DASH diet and commitment to daily physical activity for a minimum of 30 minutes discussed and encouraged, as a part of hypertension management. The importance of attaining a healthy weight is also discussed.     07/18/2022    8:23 AM 07/18/2022    8:11 AM 07/18/2022    8:08 AM 04/03/2022    9:15 AM 03/16/2022    9:17 AM 03/16/2022    9:16 AM 03/16/2022    8:48 AM  BP/Weight  Systolic BP 140 139 160 152 128 130 162  Diastolic BP 84 85 77 88 82 82 82  Wt. (Lbs)   357.12 372.4     BMI   63.26 kg/m2 65.97 kg/m2

## 2022-07-18 NOTE — Assessment & Plan Note (Signed)
Updated lab needed at/ before next visit.   

## 2022-07-18 NOTE — Progress Notes (Signed)
Isabella Matthews     MRN: [id number]      DOB: August 02, 1970  Chief Complaint  Patient presents with   Follow-up    Follow up issues with legs     HPI Isabella Matthews is here for follow up and re-evaluation of chronic medical conditions, medication management and review of any available recent lab and radiology data.  Preventive health is updated, specifically  Cancer screening and Immunization.   Questions or concerns regarding consultations or procedures which the PT has had in the interim are  addressed. The PT denies any adverse reactions to current medications since the last visit.  Denies polyuria, polydipsia, blurred vision , or hypoglycemic episodes. C/o intermittent leg swelling when ion her feet all day, goes down overnight, does not want to commit to tasking pill when working as inconvenient  ROS Denies recent fever or chills. Denies sinus pressure, nasal congestion, ear pain or sore throat. Denies chest congestion, productive cough or wheezing. Denies chest pains, palpitations PND or orthopnea Denies abdominal pain, nausea, vomiting,diarrhea or constipation.   Denies dysuria, frequency, hesitancy or incontinence. Denies unbcontrolled  joint pain, swelling and limitation in mobility. Denies headaches, seizures, numbness, or tingling. Denies depression, anxiety or insomnia. Denies skin break down or rash.   PE  BP (!) 140/84   Pulse 91   Ht 5\' 3"  (1.6 m)   Wt (!) 357 lb 1.9 oz (162 kg)   LMP  (LMP Unknown)   SpO2 95%   BMI 63.26 kg/m   Patient alert and oriented and in no cardiopulmonary distress.  HEENT: No facial asymmetry, EOMI,     Neck supple .  Chest: Clear to auscultation bilaterally.  CVS: S1, S2 no murmurs, no S3.Regular rate.  ABD: Soft non tender.   Ext: Trace  edema  MS: Adequate ROM spine, shoulders, hips and knees.  Skin: Intact, no ulcerations or rash noted.  Psych: Good eye contact, normal affect. Memory intact not anxious or depressed  appearing.  CNS: CN 2-12 intact, power,  normal throughout.no focal deficits noted.   Assessment & Plan  Hypertension associated with type 2 diabetes mellitus (HCC) Improved but not at goal, no medication changes anticipate an additional 15 pound weight loss in the next 4 months which will be wonderful.  DASH diet and commitment to daily physical activity for a minimum of 30 minutes discussed and encouraged, as a part of hypertension management. The importance of attaining a healthy weight is also discussed.     07/18/2022    8:23 AM 07/18/2022    8:11 AM 07/18/2022    8:08 AM 04/03/2022    9:15 AM 03/16/2022    9:17 AM 03/16/2022    9:16 AM 03/16/2022    8:48 AM  BP/Weight  Systolic BP 140 139 160 152 128 130 162  Diastolic BP 84 85 77 88 82 82 82  Wt. (Lbs)   357.12 372.4     BMI   63.26 kg/m2 65.97 kg/m2          MORBID OBESITY Marked improvement, patient is encouraged to keep this up.  Patient re-educated about  the importance of commitment to a  minimum of 150 minutes of exercise per week as able.  The importance of healthy food choices with portion control discussed, as well as eating regularly and within a 12 hour window most days. The need to choose "clean , green" food 50 to 75% of the time is discussed, as well as to make water  the primary drink and set a goal of 64 ounces water daily.       07/18/2022    8:08 AM 04/03/2022    9:15 AM 03/16/2022    8:46 AM  Weight /BMI  Weight 357 lb 1.9 oz 372 lb 6.4 oz 370 lb 1.3 oz  Height 5\' 3"  (1.6 m) 5\' 3"  (1.6 m) 5\' 3"  (1.6 m)  BMI 63.26 kg/m2 65.97 kg/m2 65.56 kg/m2      Hyperlipidemia associated with type 2 diabetes mellitus (HCC) Hyperlipidemia:Low fat diet discussed and encouraged.   Lipid Panel  Lab Results  Component Value Date   CHOL 181 07/13/2022   HDL 89 07/13/2022   LDLCALC 74 07/13/2022   TRIG 100 07/13/2022   CHOLHDL 2.0 07/13/2022     Controlled, no change in medication   Venous stasis  dermatitis of both lower extremities Leg elevation, and diuretic as needed, also compression hose  Vitamin D deficiency Updated lab needed at/ before next visit.

## 2022-07-18 NOTE — Assessment & Plan Note (Signed)
Hyperlipidemia:Low fat diet discussed and encouraged.   Lipid Panel  Lab Results  Component Value Date   CHOL 181 07/13/2022   HDL 89 07/13/2022   LDLCALC 74 07/13/2022   TRIG 100 07/13/2022   CHOLHDL 2.0 07/13/2022     Controlled, no change in medication

## 2022-08-04 LAB — LIPID PANEL
Chol/HDL Ratio: 1.8 ratio (ref 0.0–4.4)
Cholesterol, Total: 159 mg/dL (ref 100–199)
HDL: 87 mg/dL (ref >39)
LDL Chol Calc (NIH): 58 mg/dL (ref 0–99)
Triglycerides: 74 mg/dL (ref 0–149)
VLDL Cholesterol Cal: 14 mg/dL (ref 5–40)

## 2022-08-04 LAB — COMPREHENSIVE METABOLIC PANEL
ALT: 22 IU/L (ref 0–32)
AST: 23 IU/L (ref 0–40)
Albumin: 4.4 g/dL (ref 3.8–4.9)
Alkaline Phosphatase: 87 IU/L (ref 44–121)
BUN/Creatinine Ratio: 21 (ref 9–23)
BUN: 15 mg/dL (ref 6–24)
Bilirubin Total: 0.4 mg/dL (ref 0.0–1.2)
CO2: 27 mmol/L (ref 20–29)
Calcium: 9.9 mg/dL (ref 8.7–10.2)
Chloride: 104 mmol/L (ref 96–106)
Creatinine, Ser: 0.71 mg/dL (ref 0.57–1.00)
Globulin, Total: 2.8 g/dL (ref 1.5–4.5)
Glucose: 115 mg/dL — ABNORMAL HIGH (ref 70–99)
Potassium: 4.4 mmol/L (ref 3.5–5.2)
Sodium: 145 mmol/L — ABNORMAL HIGH (ref 134–144)
Total Protein: 7.2 g/dL (ref 6.0–8.5)
eGFR: 102 mL/min/{1.73_m2} (ref >59)

## 2022-08-07 LAB — COLOGUARD: COLOGUARD: NEGATIVE

## 2022-08-09 ENCOUNTER — Encounter: Payer: Self-pay | Admitting: "Endocrinology

## 2022-08-09 ENCOUNTER — Ambulatory Visit: Payer: No Typology Code available for payment source | Admitting: "Endocrinology

## 2022-08-09 VITALS — BP 138/79 | HR 72 | Ht 63.0 in | Wt 365.4 lb

## 2022-08-09 DIAGNOSIS — E559 Vitamin D deficiency, unspecified: Secondary | ICD-10-CM | POA: Diagnosis not present

## 2022-08-09 DIAGNOSIS — E782 Mixed hyperlipidemia: Secondary | ICD-10-CM

## 2022-08-09 DIAGNOSIS — Z7985 Long-term (current) use of injectable non-insulin antidiabetic drugs: Secondary | ICD-10-CM | POA: Diagnosis not present

## 2022-08-09 DIAGNOSIS — I10 Essential (primary) hypertension: Secondary | ICD-10-CM | POA: Diagnosis not present

## 2022-08-09 DIAGNOSIS — Z6841 Body Mass Index (BMI) 40.0 and over, adult: Secondary | ICD-10-CM

## 2022-08-09 DIAGNOSIS — E1121 Type 2 diabetes mellitus with diabetic nephropathy: Secondary | ICD-10-CM | POA: Diagnosis not present

## 2022-08-09 DIAGNOSIS — Z794 Long term (current) use of insulin: Secondary | ICD-10-CM

## 2022-08-09 LAB — POCT GLYCOSYLATED HEMOGLOBIN (HGB A1C): HbA1c, POC (controlled diabetic range): 6.8 % (ref 0.0–7.0)

## 2022-08-09 NOTE — Progress Notes (Signed)
08/09/2022, 10:48 AM                                 Endocrinology follow-up note   Subjective:    Patient ID: Isabella Matthews, female    DOB: December 23, 1970.  Isabella Matthews is being seen in follow-up for the management of currently controlled type 2 diabetes, hyperlipidemia, hypertension. PMD: Kerri Perches, MD.   Past Medical History:  Diagnosis Date   Aortic stenosis, mild    Aortic valve disorders    Diabetes mellitus    Diabetes mellitus without complication (HCC)    Phreesia 02/08/2020   Edema    Essential hypertension    Hypertension    Phreesia 02/08/2020   Morbid obesity (HCC)    Onychomycosis of toenail     Past Surgical History:  Procedure Laterality Date   CESAREAN SECTION     x2    Social History   Socioeconomic History   Marital status: Single    Spouse name: Not on file   Number of children: Not on file   Years of education: Not on file   Highest education level: Not on file  Occupational History   Occupation: CNA - [employer]   Tobacco Use   Smoking status: Former    Current packs/day: 0.00    Types: Cigarettes    Quit date: 11/23/2014    Years since quitting: 7.7   Smokeless tobacco: Never  Vaping Use   Vaping status: Never Used  Substance and Sexual Activity   Alcohol use: Yes    Alcohol/week: 0.0 standard drinks of alcohol    Comment: occasionally   Drug use: No   Sexual activity: Not on file  Other Topics Concern   Not on file  Social History Narrative   Not on file   Social Determinants of Health   Financial Resource Strain: Not on file  Food Insecurity: Not on file  Transportation Needs: Not on file  Physical Activity: Not on file  Stress: Not on file  Social Connections: Not on file    Family History  Problem Relation Age of Onset   Hypertension Mother    Hypertension Father    Diabetes Father     Outpatient Encounter Medications as of 08/09/2022  Medication Sig   amLODipine  (NORVASC) 10 MG tablet TAKE ONE TABLET BY MOUTH ONCE DAILY *INCREASED DOSAGE*   aspirin 81 MG EC tablet Take 1 tablet (81 mg total) by mouth daily. Swallow whole.   Cholecalciferol (VITAMIN D3) 125 MCG (5000 UT) CAPS Take 1 capsule (5,000 Units total) by mouth daily.   furosemide (LASIX) 20 MG tablet Take 1 tablet (20 mg total) by mouth daily.   gabapentin (NEURONTIN) 300 MG capsule TAKE 1 CAPSULE BY MOUTH AT BEDTIME.   glucose blood test strip 1 each by Other route 2 (two) times daily. Use as instructed   hydrALAZINE (APRESOLINE) 10 MG tablet Take 1 tablet (10 mg total) by mouth 3 (three) times daily.   insulin degludec (TRESIBA FLEXTOUCH) 200 UNIT/ML FlexTouch Pen INJECT 70 UNITS INTO THE SKIN AT BEDTIME   losartan (COZAAR) 100 MG tablet TAKE 1 TABLET  BY MOUTH DAILY.   Multiple Vitamin (MULTIVITAMIN) tablet Take 1 tablet by mouth daily.   rosuvastatin (CRESTOR) 20 MG tablet TAKE ONE TABLET BY MOUTH ONCE DAILY   triamcinolone cream (KENALOG) 0.1 % Apply 1 Application topically 2 (  two) times daily.   TRULICITY 4.5 MG/0.5ML SOPN INJECT 4.5MG  SUBCUTANEOUSLY ONCE WEEKLY AS DIRECTED   No facility-administered encounter medications on file as of 08/09/2022.    ALLERGIES: Allergies  Allergen Reactions   Penicillins Swelling    Has patient had a PCN reaction causing immediate rash, facial/tongue/throat swelling, SOB or lightheadedness with hypotension: Yes Has patient had a PCN reaction causing severe rash involving mucus membranes or skin necrosis: No Has patient had a PCN reaction that required hospitalization: No Has patient had a PCN reaction occurring within the last 10 years: Yes If all of the above answers are "NO", then may proceed with Cephalosporin use. Leg swelling/redness   Ace Inhibitors Cough   Metformin And Related Nausea Only   Tramadol Rash    Scant rash on hands  After starting tramadol for knee pain in Jan 16, 2013    VACCINATION STATUS: Immunization History   Administered Date(s) Administered   Influenza Split 10/27/2013   Influenza Whole 12/29/2005, 09/29/2009   Influenza,inj,Quad PF,6+ Mos 10/17/2012, 10/24/2016, 10/01/2018, 10/08/2019, 03/03/2021, 10/11/2021   Moderna Sars-Covid-2 Vaccination 01/28/2019, 12/16/2019, 07/22/2020   Pneumococcal Polysaccharide-23 01/12/2006, 07/09/2015   Tdap 08/12/2010, 09/08/2020   Zoster Recombinant(Shingrix) 07/23/2019, 10/08/2019    Diabetes She presents for her follow-up diabetic visit. She has type 2 diabetes mellitus. Onset time: She was diagnosed at approximate age of 52 years, she did have an episode of gestational diabetes. Her disease course has been stable. There are no hypoglycemic associated symptoms. Pertinent negatives for hypoglycemia include no confusion, headaches, pallor or seizures. Pertinent negatives for diabetes include no chest pain, no fatigue, no polydipsia, no polyphagia and no polyuria. There are no hypoglycemic complications. Symptoms are improving. Diabetic complications include peripheral neuropathy. Risk factors for coronary artery disease include diabetes mellitus, dyslipidemia, family history, hypertension, obesity and sedentary lifestyle. Current diabetic treatment includes insulin injections and oral agent (monotherapy). She is compliant with treatment most of the time. Her weight is fluctuating minimally. She is following a generally unhealthy diet. When asked about meal planning, she reported none. She has not had a previous visit with a dietitian. She rarely participates in exercise. Her home blood glucose trend is decreasing steadily. Her breakfast blood glucose range is generally 110-130 mg/dl. Her bedtime blood glucose range is generally 130-140 mg/dl. Her overall blood glucose range is 130-140 mg/dl. Isabella Matthews presents with controlled glycemic profile.  Her point-of-care A1c is 6.8% since last visit.  She did not document any hypoglycemia.  ) An ACE inhibitor/angiotensin II receptor  blocker is being taken. Eye exam is current.  Hyperlipidemia This is a chronic problem. The current episode started more than 1 year ago. The problem is controlled. Exacerbating diseases include diabetes and obesity. Pertinent negatives include no chest pain, myalgias or shortness of breath. Current antihyperlipidemic treatment includes statins. Risk factors for coronary artery disease include dyslipidemia, family history, obesity, hypertension, a sedentary lifestyle and diabetes mellitus.  Hypertension This is a chronic problem. The current episode started more than 1 year ago. Pertinent negatives include no chest pain, headaches, palpitations or shortness of breath. Risk factors for coronary artery disease include diabetes mellitus, dyslipidemia, obesity and sedentary lifestyle. Past treatments include angiotensin blockers.     Review of systems: Limited as above.  Objective:    BP 138/79   Pulse 72   Ht 5\' 3"  (1.6 m)   Wt (!) 365 lb 6.4 oz (165.7 kg)   LMP  (LMP Unknown)   BMI 64.73 kg/m   Wt Readings from  Last 3 Encounters:  08/09/22 (!) 365 lb 6.4 oz (165.7 kg)  07/18/22 (!) 357 lb 1.9 oz (162 kg)  04/03/22 (!) 372 lb 6.4 oz (168.9 kg)     Physical Exam- Limited  Constitutional:  Body mass index is 64.73 kg/m. , not in acute distress, normal state of mind    CMP ( most recent) CMP     Component Value Date/Time   NA 145 (H) 08/03/2022 0808   K 4.4 08/03/2022 0808   CL 104 08/03/2022 0808   CO2 27 08/03/2022 0808   GLUCOSE 115 (H) 08/03/2022 0808   GLUCOSE 138 (H) 06/19/2019 0721   BUN 15 08/03/2022 0808   CREATININE 0.71 08/03/2022 0808   CREATININE 0.87 06/19/2019 0721   CALCIUM 9.9 08/03/2022 0808   PROT 7.2 08/03/2022 0808   ALBUMIN 4.4 08/03/2022 0808   AST 23 08/03/2022 0808   ALT 22 08/03/2022 0808   ALKPHOS 87 08/03/2022 0808   BILITOT 0.4 08/03/2022 0808   GFRNONAA 70 02/02/2020 1128   GFRNONAA 101 02/14/2019 0848   GFRAA 81 02/02/2020 1128   GFRAA  117 02/14/2019 0848    Diabetic Labs (most recent): Lab Results  Component Value Date   HGBA1C 6.8 08/09/2022   HGBA1C 6.8 04/05/2022   HGBA1C 7.5 (A) 01/02/2022   MICROALBUR 30 09/01/2021   MICROALBUR 3.0 (H) 07/23/2019   MICROALBUR 33.3 01/27/2017     Lipid Panel ( most recent) Lipid Panel     Component Value Date/Time   CHOL 159 08/03/2022 0808   TRIG 74 08/03/2022 0808   HDL 87 08/03/2022 0808   CHOLHDL 1.8 08/03/2022 0808   CHOLHDL 2.2 07/23/2019 1010   VLDL 22 03/16/2016 0808   LDLCALC 58 08/03/2022 0808   LDLCALC 89 07/23/2019 1010   LABVLDL 14 08/03/2022 0808      Lab Results  Component Value Date   TSH 0.993 01/19/2022   TSH 0.838 09/17/2020   TSH 0.98 06/19/2019   TSH 0.88 02/14/2019   TSH 0.96 01/27/2017   TSH 1.18 11/05/2015   TSH 1.603 07/10/2014   TSH 1.152 10/14/2012   TSH 1.202 10/14/2012   TSH 0.437 04/20/2011   FREET4 1.36 09/17/2020   FREET4 1.2 06/19/2019   FREET4 1.2 02/14/2019     Assessment & Plan:   1. Type 2 diabetes mellitus with diabetic nephropathy, with long-term current use of insulin (HCC)   - Isabella Matthews has currently uncontrolled symptomatic type 2 DM since  52 years of age.  Isabella Matthews presents with controlled glycemic profile.  Her point-of-care A1c is 6.8% since last visit.  She did not document any hypoglycemia.    -Her recent labs were reviewed with her. - I had a long discussion with her about the progressive nature of diabetes and the pathology behind its complications. -her diabetes is complicated by neuropathy, obesity/sedentary life and she remains at a high risk for more acute and chronic complications which include CAD, CVA, CKD, retinopathy, and neuropathy. These are all discussed in detail with her.  - I have counseled her on diet  and weight management  by adopting a carbohydrate restricted/protein rich diet. Patient is encouraged to switch to  unprocessed or minimally processed     complex starch and  increased protein intake (animal or plant source), fruits, and vegetables. -Admittedly, she has not engaged optimally to lifestyle medicine, however, she wishes to try again.  - she acknowledges that there is a room for improvement in her food and drink  choices. - Suggestion is made for her to avoid simple carbohydrates  from her diet including Cakes, Sweet Desserts, Ice Cream, Soda (diet and regular), Sweet Tea, Candies, Chips, Cookies, Store Bought Juices, Alcohol , Artificial Sweeteners,  Coffee Creamer, and "Sugar-free" Products, Lemonade. This will help patient to have more stable blood glucose profile and potentially avoid unintended weight gain.  The following Lifestyle Medicine recommendations according to American College of Lifestyle Medicine  [facility]) were discussed and and offered to patient and she  agrees to start the journey:  A. Whole Foods, Plant-Based Nutrition comprising of fruits and vegetables, plant-based proteins, whole-grain carbohydrates was discussed in detail with the patient.   A list for source of those nutrients were also provided to the patient.  Patient will use only water or unsweetened tea for hydration. B.  The need to stay away from risky substances including alcohol, smoking; obtaining 7 to 9 hours of restorative sleep, at least 150 minutes of moderate intensity exercise weekly, the importance of healthy social connections,  and stress management techniques were discussed. C.  A full color page of  Calorie density of various food groups per pound showing examples of each food groups was provided to the patient.    - she has been scheduled with Norm Salt, RDN, CDE for diabetes education.  - I have approached her with the following individualized plan to manage  her diabetes and patient agrees:   In light of her presentation with near target fasting and postprandial glycemic profile, she will not need any prandial insulin for now.  She is advised to continue  Tresiba 70 units nightly, 90 associated with monitoring of blood glucose twice a day-daily before breakfast and at bedtime.   - she is warned not to take insulin without proper monitoring per orders. - she is encouraged to call clinic for blood glucose levels less than 70 or above 200 mg /dl.  -She reports GI intolerance from metformin.  She is tolerating and benefiting from Trulicity.  She is advised to continue Trulicity 4.5 mg subcutaneously weekly.     - Specific targets for  A1c;  LDL, HDL, Triglycerides, and  Waist Circumference were discussed with the patient.  2) Blood Pressure /Hypertension:  --Her blood pressure is controlled to target.  She is advised to continue losartan 100 mg p.o. daily, amlodipine 5 mg p.o. daily at breakfast.  She also has hydralazine 10 mg p.o. 3 times daily.  The above detailed lifestyle nutrition will help with hypertension and hyperlipidemia.  3) Lipids/Hyperlipidemia: Uncontrolled, recent fasting lipid panel showed improved LDL of 60 from 110.  He is advised to continue Crestor 20 mg p.o. daily at bedtime.     Whole food plant-based diet will help with dyslipidemia as well.     Side effects and precautions discussed with her.  4)  Weight/Diet: Her BMI is 64.73-- clearly complicating her diabetes care.   she is  a candidate for weight loss. I discussed with her the fact that loss of 5 - 10% of her  current body weight will have the most impact on her diabetes management.  Exercise, and detailed carbohydrates information provided  -  detailed on discharge instructions.  She is a perfect candidate for whole food plant-based diet described above. -She may benefit from bariatric surgery, will be discussed in more detail on subsequent visit.  5) vitamin D deficiency She is on ongoing supplement with vitamin D3 5000 units daily.  She is now vitamin D replete  at 74.  She is advised to continue.  6) Chronic Care/Health Maintenance:  -she  is on ACEI/ARB and  Statin medications and  is encouraged to initiate and continue to follow up with Ophthalmology, Dentist,  Podiatrist at least yearly or according to recommendations, and advised to   stay away from smoking. I have recommended yearly flu vaccine and pneumonia vaccine at least every 5 years; moderate intensity exercise for up to 150 minutes weekly; and  sleep for at least 7 hours a day.  - she is  advised to maintain close follow up with Kerri Perches, MD for primary care needs, as well as her other providers for optimal and coordinated care.   I spent  41 minutes in the care of the patient today including review of labs from CMP, Lipids, Thyroid Function, Hematology (current and previous including abstractions from other facilities); face-to-face time discussing  her blood glucose readings/logs, discussing hypoglycemia and hyperglycemia episodes and symptoms, medications doses, her options of short and long term treatment based on the latest standards of care / guidelines;  discussion about incorporating lifestyle medicine;  and documenting the encounter. Risk reduction counseling performed per USPSTF guidelines to reduce  obesity and cardiovascular risk factors.     Please refer to Patient Instructions for Blood Glucose Monitoring and Insulin/Medications Dosing Guide"  in media tab for additional information. Please  also refer to " Patient Self Inventory" in the Media  tab for reviewed elements of pertinent patient history.  Celesta Aver participated in the discussions, expressed understanding, and voiced agreement with the above plans.  All questions were answered to her satisfaction. she is encouraged to contact clinic should she have any questions or concerns prior to her return visit.    Follow up plan: - Return in about 4 months (around 12/10/2022) for Bring Meter/CGM Device/Logs- A1c in Office.  Marquis Lunch, MD [facility] [address] [place], [place] [address] Phone: [phone]  Fax: [phone]    08/09/2022, 10:48 AM  This note was partially dictated with voice recognition software. Similar sounding words can be transcribed inadequately or may not  be corrected upon review.

## 2022-08-09 NOTE — Patient Instructions (Signed)

## 2022-09-12 ENCOUNTER — Other Ambulatory Visit: Payer: Self-pay | Admitting: Internal Medicine

## 2022-09-12 DIAGNOSIS — I10 Essential (primary) hypertension: Secondary | ICD-10-CM

## 2022-09-18 ENCOUNTER — Telehealth: Payer: Self-pay | Admitting: Family Medicine

## 2022-09-18 NOTE — Telephone Encounter (Signed)
Patient called in pharm change to [facility] in  Phone [phone]

## 2022-09-19 ENCOUNTER — Other Ambulatory Visit: Payer: Self-pay

## 2022-09-19 MED ORDER — ROSUVASTATIN CALCIUM 20 MG PO TABS: 20.0000 mg | ORAL_TABLET | Freq: Every day | ORAL | 3 refills | Status: DC

## 2022-09-19 MED ORDER — ASPIRIN 81 MG PO TBEC: 81.0000 mg | DELAYED_RELEASE_TABLET | Freq: Every day | ORAL | Status: AC

## 2022-09-19 MED ORDER — HYDRALAZINE HCL 10 MG PO TABS: 10.0000 mg | ORAL_TABLET | Freq: Three times a day (TID) | ORAL | 3 refills | Status: DC

## 2022-09-19 MED ORDER — AMLODIPINE BESYLATE 10 MG PO TABS: ORAL_TABLET | ORAL | 3 refills | Status: DC

## 2022-09-19 MED ORDER — GABAPENTIN 300 MG PO CAPS: 300.0000 mg | ORAL_CAPSULE | Freq: Every day | ORAL | 3 refills | Status: DC

## 2022-09-19 MED ORDER — LOSARTAN POTASSIUM 100 MG PO TABS: 100.0000 mg | ORAL_TABLET | Freq: Every day | ORAL | 3 refills | Status: DC

## 2022-09-19 NOTE — Telephone Encounter (Signed)
S[facility]

## 2022-10-06 ENCOUNTER — Telehealth: Payer: Self-pay | Admitting: "Endocrinology

## 2022-10-06 NOTE — Telephone Encounter (Signed)
Called [facility] was placed on hold for 15 minutes did not receive an answer. Will try to call at a later time.

## 2022-10-06 NOTE — Telephone Encounter (Signed)
Pt is trying to set up delivery service through [facility].  They said that someone needed to call to set up for patient.  Trulicity and [place].  The number is [phone].  Pt told them to fax something so we could get it set up but they stated someone from here needed to call them.

## 2022-10-09 NOTE — Telephone Encounter (Signed)
Called [facility] ordered Rx for Tresiba 70 units at bedtime and pen needles. [facility] representative stated they are not taking any new orders for Trulicity or any other GLP-1 due to manufacturers shortage. Pt made aware. She stated she would check with her local pharmacy and call the office back.

## 2022-10-10 ENCOUNTER — Telehealth: Payer: Self-pay | Admitting: "Endocrinology

## 2022-10-10 MED ORDER — TRULICITY 4.5 MG/0.5ML ~~LOC~~ SOAJ: 4.5000 mg | SUBCUTANEOUS | 1 refills | Status: DC

## 2022-10-10 NOTE — Telephone Encounter (Signed)
Rx sent 

## 2022-10-10 NOTE — Telephone Encounter (Signed)
Pt states trulicity can be called into [facility], but they can only do a 30 day supply. 4.5 mg

## 2022-10-16 NOTE — Telephone Encounter (Signed)
Spoke with [employer]. She stated pt's insurance prefers [place] or Lantus over [place]. Rx for Toujeo Max 70 units at bedtime sent to [facility] and sample pen of Toujeo Max given to pt per Dr.Nida's orders. Pt made aware and voiced understanding.

## 2022-10-16 NOTE — Telephone Encounter (Signed)
Pt states that [facility] did not receive the request for [employer]. Please Advise. She is out.

## 2022-10-21 LAB — BMP8+EGFR
BUN/Creatinine Ratio: 22 (ref 9–23)
BUN: 16 mg/dL (ref 6–24)
CO2: 22 mmol/L (ref 20–29)
Calcium: 10.1 mg/dL (ref 8.7–10.2)
Chloride: 101 mmol/L (ref 96–106)
Creatinine, Ser: 0.72 mg/dL (ref 0.57–1.00)
Glucose: 160 mg/dL — ABNORMAL HIGH (ref 70–99)
Potassium: 4.5 mmol/L (ref 3.5–5.2)
Sodium: 141 mmol/L (ref 134–144)
eGFR: 100 mL/min/{1.73_m2} (ref >59)

## 2022-10-21 LAB — CBC
Hematocrit: 38.1 % (ref 34.0–46.6)
Hemoglobin: 12.4 g/dL (ref 11.1–15.9)
MCH: 30.2 pg (ref 26.6–33.0)
MCHC: 32.5 g/dL (ref 31.5–35.7)
MCV: 93 fL (ref 79–97)
Platelets: 364 10*3/uL (ref 150–450)
RBC: 4.11 x10E6/uL (ref 3.77–5.28)
RDW: 13.1 % (ref 11.7–15.4)
WBC: 12.1 10*3/uL — ABNORMAL HIGH (ref 3.4–10.8)

## 2022-10-21 LAB — TSH: TSH: 0.652 u[IU]/mL (ref 0.450–4.500)

## 2022-10-21 LAB — VITAMIN D 25 HYDROXY (VIT D DEFICIENCY, FRACTURES): Vit D, 25-Hydroxy: 74 ng/mL (ref 30.0–100.0)

## 2022-10-21 LAB — MICROALBUMIN / CREATININE URINE RATIO
Creatinine, Urine: 116.9 mg/dL
Microalb/Creat Ratio: 18 mg/g{creat} (ref 0–29)
Microalbumin, Urine: 21.4 ug/mL

## 2022-10-24 ENCOUNTER — Ambulatory Visit (INDEPENDENT_AMBULATORY_CARE_PROVIDER_SITE_OTHER): Payer: 59 | Admitting: Family Medicine

## 2022-10-24 ENCOUNTER — Encounter: Payer: Self-pay | Admitting: Family Medicine

## 2022-10-24 VITALS — BP 110/72 | HR 95 | Ht 63.0 in | Wt 360.1 lb

## 2022-10-24 DIAGNOSIS — Z7985 Long-term (current) use of injectable non-insulin antidiabetic drugs: Secondary | ICD-10-CM

## 2022-10-24 DIAGNOSIS — J189 Pneumonia, unspecified organism: Secondary | ICD-10-CM

## 2022-10-24 DIAGNOSIS — Z1231 Encounter for screening mammogram for malignant neoplasm of breast: Secondary | ICD-10-CM

## 2022-10-24 DIAGNOSIS — Z23 Encounter for immunization: Secondary | ICD-10-CM | POA: Diagnosis not present

## 2022-10-24 DIAGNOSIS — E782 Mixed hyperlipidemia: Secondary | ICD-10-CM | POA: Diagnosis not present

## 2022-10-24 DIAGNOSIS — E1169 Type 2 diabetes mellitus with other specified complication: Secondary | ICD-10-CM

## 2022-10-24 DIAGNOSIS — Z794 Long term (current) use of insulin: Secondary | ICD-10-CM

## 2022-10-24 DIAGNOSIS — I10 Essential (primary) hypertension: Secondary | ICD-10-CM

## 2022-10-24 DIAGNOSIS — I152 Hypertension secondary to endocrine disorders: Secondary | ICD-10-CM

## 2022-10-24 DIAGNOSIS — E1159 Type 2 diabetes mellitus with other circulatory complications: Secondary | ICD-10-CM

## 2022-10-24 DIAGNOSIS — Z6841 Body Mass Index (BMI) 40.0 and over, adult: Secondary | ICD-10-CM

## 2022-10-24 DIAGNOSIS — E785 Hyperlipidemia, unspecified: Secondary | ICD-10-CM

## 2022-10-24 MED ORDER — FUROSEMIDE 20 MG PO TABS
ORAL_TABLET | ORAL | 2 refills | Status: DC
Start: 2022-10-24 — End: 2023-12-14

## 2022-10-24 NOTE — Patient Instructions (Addendum)
Annual with Pap end of January, call if you need me sooner.  Please schedule mammogram at checkout.  Flu vaccine at visit.  Please get your COVID-vaccine in the next 1 to 2 weeks.  Chest x-ray to be done first week in November.  Foot exam shows reduced sensation and vibration sense.  It is very important that you check examine your feet regularly.  It is also advised that you have a professional cut your toenails.  Fasting CBC lipid CMP and EGFR to be obtained 1 week before your January appointment.  It is important that you exercise regularly at least 30 minutes 5 times a week. If you develop chest pain, have severe difficulty breathing, or feel very tired, stop exercising immediately and seek medical attention   Thanks for choosing [facility], we consider it a privelige to serve you.

## 2022-10-25 ENCOUNTER — Encounter: Payer: Self-pay | Admitting: Family Medicine

## 2022-10-25 DIAGNOSIS — E1169 Type 2 diabetes mellitus with other specified complication: Secondary | ICD-10-CM | POA: Insufficient documentation

## 2022-10-25 DIAGNOSIS — J189 Pneumonia, unspecified organism: Secondary | ICD-10-CM | POA: Insufficient documentation

## 2022-10-25 NOTE — Assessment & Plan Note (Addendum)
Isabella Matthews is reminded of the importance of commitment to daily physical activity for 30 minutes or more, as able and the need to limit carbohydrate intake to 30 to 60 grams per meal to help with blood sugar control.   The need to take medication as prescribed, test blood sugar as directed, and to call between visits if there is a concern that blood sugar is uncontrolled is also discussed.  Controlled, no change in medication Managed by Endo  Isabella Matthews is reminded of the importance of daily foot exam, annual eye examination, and good blood sugar, blood pressure and cholesterol control.     Latest Ref Rng & Units 10/19/2022    3:15 PM 08/09/2022    9:06 AM 08/03/2022    8:08 AM 07/13/2022    7:58 AM 04/05/2022   10:10 AM  Diabetic Labs  HbA1c 0.0 - 7.0 %  6.8    6.8   Micro/Creat Ratio 0 - 29 mg/g creat 18       Chol 100 - 199 mg/dL   161  096    HDL >04 mg/dL   87  89    Calc LDL 0 - 99 mg/dL   58  74    Triglycerides 0 - 149 mg/dL   74  540    Creatinine 0.57 - 1.00 mg/dL 9.81   1.91  4.78        10/24/2022    8:42 AM 08/09/2022    8:52 AM 07/18/2022    8:23 AM 07/18/2022    8:11 AM 07/18/2022    8:08 AM 04/03/2022    9:15 AM 03/16/2022    9:17 AM  BP/Weight  Systolic BP 110 138 140 139 160 152 128  Diastolic BP 72 79 84 85 77 88 82  Wt. (Lbs) 360.08 365.4   357.12 372.4   BMI 63.79 kg/m2 64.73 kg/m2   63.26 kg/m2 65.97 kg/m2       10/24/2022    8:40 AM 10/11/2021    4:00 PM  Foot/eye exam completion dates  Foot Form Completion Done Done

## 2022-10-25 NOTE — Assessment & Plan Note (Addendum)
  Patient re-educated about  the importance of commitment to a  minimum of 150 minutes of exercise per week as able.  The importance of healthy food choices with portion control discussed, as well as eating regularly and within a 12 hour window most days. The need to choose "clean , green" food 50 to 75% of the time is discussed, as well as to make water the primary drink and set a goal of 64 ounces water daily.       10/24/2022    8:42 AM 08/09/2022    8:52 AM 07/18/2022    8:08 AM  Weight /BMI  Weight 360 lb 1.3 oz 365 lb 6.4 oz 357 lb 1.9 oz  Height 5\' 3"  (1.6 m) 5\' 3"  (1.6 m) 5\' 3"  (1.6 m)  BMI 63.79 kg/m2 64.73 kg/m2 63.26 kg/m2

## 2022-10-25 NOTE — Progress Notes (Signed)
Isabella Matthews     MRN: [id number]      DOB: 05/28/1970  Chief Complaint  Patient presents with   Follow-up    Follow up    HPI Isabella Matthews is here for follow up and re-evaluation of chronic medical conditions, medication management and review of any available recent lab and radiology data.  Preventive health is updated, specifically  Cancer screening and Immunization.   .[facility], out o work x 1 week, still has cough, sputum is less , also has frontal sinus pressure and PND, feeling 70 % improved and back at work, no  fever or chills in past 1 week, treated with augmentin and Z pack The PT denies any adverse reactions to current medications since the last visit.  Denies polyuria, polydipsia, blurred vision , or hypoglycemic episodes.  ROS Denies chest pains, palpitations and leg swelling Denies abdominal pain, nausea, vomiting,diarrhea or constipation.   Denies dysuria, frequency, hesitancy or incontinence. Denies uncontrolled joint pain, swelling and limitation in mobility. Denies headaches, seizures, numbness, or tingling. Denies depression, anxiety or insomnia. Denies skin break down or rash.   PE  BP 110/72 (BP Location: Right Arm, Patient Position: Sitting, Cuff Size: Large)   Pulse 95   Ht 5\' 3"  (1.6 m)   Wt (!) 360 lb 1.3 oz (163.3 kg)   LMP  (LMP Unknown)   SpO2 91%   BMI 63.79 kg/m   Patient alert and oriented and in no cardiopulmonary distress.  HEENT: No facial asymmetry, EOMI,     Neck supple .  Chest: decreased air entry, no wheezes or crackles CVS: S1, S2 no murmurs, no S3.Regular rate.  ABD: Soft non tender.   Ext: No edema  MS: Adequate ROM spine, shoulders, hips and knees.  Skin: Intact, no ulcerations or rash noted.  Psych: Good eye contact, normal affect. Memory intact not anxious or depressed appearing.  CNS: CN 2-12 intact, power,  normal throughout.no focal deficits noted.   Assessment &  Plan  Pneumonia Improved, treatment complered , lingering cough and chest congestion, rept cxy in late oct/ early Nov  Essential hypertension, benign DASH diet and commitment to daily physical activity for a minimum of 30 minutes discussed and encouraged, as a part of hypertension management. The importance of attaining a healthy weight is also discussed.     10/24/2022    8:42 AM 08/09/2022    8:52 AM 07/18/2022    8:23 AM 07/18/2022    8:11 AM 07/18/2022    8:08 AM 04/03/2022    9:15 AM 03/16/2022    9:17 AM  BP/Weight  Systolic BP 110 138 140 139 160 152 128  Diastolic BP 72 79 84 85 77 88 82  Wt. (Lbs) 360.08 365.4   357.12 372.4   BMI 63.79 kg/m2 64.73 kg/m2   63.26 kg/m2 65.97 kg/m2      Controlled, no change in medication   Mixed hyperlipidemia Hyperlipidemia:Low fat diet discussed and encouraged.   Lipid Panel  Lab Results  Component Value Date   CHOL 159 08/03/2022   HDL 87 08/03/2022   LDLCALC 58 08/03/2022   TRIG 74 08/03/2022   CHOLHDL 1.8 08/03/2022     Controlled, no change in medication   Morbid obesity with BMI of 60.0-69.9, adult [facility])  Patient re-educated about  the importance of commitment to a  minimum of 150 minutes of exercise per week as able.  The importance of healthy food choices with  portion control discussed, as well as eating regularly and within a 12 hour window most days. The need to choose "clean , green" food 50 to 75% of the time is discussed, as well as to make water the primary drink and set a goal of 64 ounces water daily.       10/24/2022    8:42 AM 08/09/2022    8:52 AM 07/18/2022    8:08 AM  Weight /BMI  Weight 360 lb 1.3 oz 365 lb 6.4 oz 357 lb 1.9 oz  Height 5\' 3"  (1.6 m) 5\' 3"  (1.6 m) 5\' 3"  (1.6 m)  BMI 63.79 kg/m2 64.73 kg/m2 63.26 kg/m2      Long-term (current) use of injectable non-insulin antidiabetic drugs Isabella Matthews is reminded of the importance of commitment to daily physical activity for 30 minutes or  more, as able and the need to limit carbohydrate intake to 30 to 60 grams per meal to help with blood sugar control.   The need to take medication as prescribed, test blood sugar as directed, and to call between visits if there is a concern that blood sugar is uncontrolled is also discussed.  Controlled, no change in medication Managed by Endo  Isabella Matthews is reminded of the importance of daily foot exam, annual eye examination, and good blood sugar, blood pressure and cholesterol control.     Latest Ref Rng & Units 10/19/2022    3:15 PM 08/09/2022    9:06 AM 08/03/2022    8:08 AM 07/13/2022    7:58 AM 04/05/2022   10:10 AM  Diabetic Labs  HbA1c 0.0 - 7.0 %  6.8    6.8   Micro/Creat Ratio 0 - 29 mg/g creat 18       Chol 100 - 199 mg/dL   161  096    HDL >04 mg/dL   87  89    Calc LDL 0 - 99 mg/dL   58  74    Triglycerides 0 - 149 mg/dL   74  540    Creatinine 0.57 - 1.00 mg/dL 9.81   1.91  4.78        10/24/2022    8:42 AM 08/09/2022    8:52 AM 07/18/2022    8:23 AM 07/18/2022    8:11 AM 07/18/2022    8:08 AM 04/03/2022    9:15 AM 03/16/2022    9:17 AM  BP/Weight  Systolic BP 110 138 140 139 160 152 128  Diastolic BP 72 79 84 85 77 88 82  Wt. (Lbs) 360.08 365.4   357.12 372.4   BMI 63.79 kg/m2 64.73 kg/m2   63.26 kg/m2 65.97 kg/m2       10/24/2022    8:40 AM 10/11/2021    4:00 PM  Foot/eye exam completion dates  Foot Form Completion Done Done        Hypertension associated with type 2 diabetes mellitus (HCC) Controlled, no change in medication. DASH diet and commitment to daily physical activity for a minimum of 30 minutes discussed and encouraged, as a part of hypertension management. The importance of attaining a healthy weight is also discussed.     10/24/2022    8:42 AM 08/09/2022    8:52 AM 07/18/2022    8:23 AM 07/18/2022    8:11 AM 07/18/2022    8:08 AM 04/03/2022    9:15 AM 03/16/2022    9:17 AM  BP/Weight  Systolic BP 110 138 140 139 160 152 128  Diastolic BP  72 79 84 85 77 88 82  Wt. (Lbs) 360.08 365.4   357.12 372.4   BMI 63.79 kg/m2 64.73 kg/m2   63.26 kg/m2 65.97 kg/m2          Type 2 diabetes mellitus with other specified complication (HCC) Controlled, no change in medication Isabella Matthews is reminded of the importance of commitment to daily physical activity for 30 minutes or more, as able and the need to limit carbohydrate intake to 30 to 60 grams per meal to help with blood sugar control.   The need to take medication as prescribed, test blood sugar as directed, and to call between visits if there is a concern that blood sugar is uncontrolled is also discussed.   Isabella Matthews is reminded of the importance of daily foot exam, annual eye examination, and good blood sugar, blood pressure and cholesterol control.     Latest Ref Rng & Units 10/19/2022    3:15 PM 08/09/2022    9:06 AM 08/03/2022    8:08 AM 07/13/2022    7:58 AM 04/05/2022   10:10 AM  Diabetic Labs  HbA1c 0.0 - 7.0 %  6.8    6.8   Micro/Creat Ratio 0 - 29 mg/g creat 18       Chol 100 - 199 mg/dL   295  284    HDL >13 mg/dL   87  89    Calc LDL 0 - 99 mg/dL   58  74    Triglycerides 0 - 149 mg/dL   74  244    Creatinine 0.57 - 1.00 mg/dL 0.10   2.72  5.36        10/24/2022    8:42 AM 08/09/2022    8:52 AM 07/18/2022    8:23 AM 07/18/2022    8:11 AM 07/18/2022    8:08 AM 04/03/2022    9:15 AM 03/16/2022    9:17 AM  BP/Weight  Systolic BP 110 138 140 139 160 152 128  Diastolic BP 72 79 84 85 77 88 82  Wt. (Lbs) 360.08 365.4   357.12 372.4   BMI 63.79 kg/m2 64.73 kg/m2   63.26 kg/m2 65.97 kg/m2       10/24/2022    8:40 AM 10/11/2021    4:00 PM  Foot/eye exam completion dates  Foot Form Completion Done Done      Manged by Endo

## 2022-10-25 NOTE — Assessment & Plan Note (Signed)
Improved, treatment complered , lingering cough and chest congestion, rept cxy in late oct/ early Nov

## 2022-10-25 NOTE — Assessment & Plan Note (Signed)
DASH diet and commitment to daily physical activity for a minimum of 30 minutes discussed and encouraged, as a part of hypertension management. The importance of attaining a healthy weight is also discussed.     10/24/2022    8:42 AM 08/09/2022    8:52 AM 07/18/2022    8:23 AM 07/18/2022    8:11 AM 07/18/2022    8:08 AM 04/03/2022    9:15 AM 03/16/2022    9:17 AM  BP/Weight  Systolic BP 110 138 140 139 160 152 128  Diastolic BP 72 79 84 85 77 88 82  Wt. (Lbs) 360.08 365.4   357.12 372.4   BMI 63.79 kg/m2 64.73 kg/m2   63.26 kg/m2 65.97 kg/m2      Controlled, no change in medication

## 2022-10-25 NOTE — Assessment & Plan Note (Signed)
Controlled, no change in medication. DASH diet and commitment to daily physical activity for a minimum of 30 minutes discussed and encouraged, as a part of hypertension management. The importance of attaining a healthy weight is also discussed.     10/24/2022    8:42 AM 08/09/2022    8:52 AM 07/18/2022    8:23 AM 07/18/2022    8:11 AM 07/18/2022    8:08 AM 04/03/2022    9:15 AM 03/16/2022    9:17 AM  BP/Weight  Systolic BP 110 138 140 139 160 152 128  Diastolic BP 72 79 84 85 77 88 82  Wt. (Lbs) 360.08 365.4   357.12 372.4   BMI 63.79 kg/m2 64.73 kg/m2   63.26 kg/m2 65.97 kg/m2

## 2022-10-25 NOTE — Assessment & Plan Note (Signed)
Controlled, no change in medication Managed by Endo Isabella Matthews is reminded of the importance of commitment to daily physical activity for 30 minutes or more, as able and the need to limit carbohydrate intake to 30 to 60 grams per meal to help with blood sugar control.   The need to take medication as prescribed, test blood sugar as directed, and to call between visits if there is a concern that blood sugar is uncontrolled is also discussed.   Isabella Matthews is reminded of the importance of daily foot exam, annual eye examination, and good blood sugar, blood pressure and cholesterol control.     Latest Ref Rng & Units 10/19/2022    3:15 PM 08/09/2022    9:06 AM 08/03/2022    8:08 AM 07/13/2022    7:58 AM 04/05/2022   10:10 AM  Diabetic Labs  HbA1c 0.0 - 7.0 %  6.8    6.8   Micro/Creat Ratio 0 - 29 mg/g creat 18       Chol 100 - 199 mg/dL   295  284    HDL >13 mg/dL   87  89    Calc LDL 0 - 99 mg/dL   58  74    Triglycerides 0 - 149 mg/dL   74  244    Creatinine 0.57 - 1.00 mg/dL 0.10   2.72  5.36        10/24/2022    8:42 AM 08/09/2022    8:52 AM 07/18/2022    8:23 AM 07/18/2022    8:11 AM 07/18/2022    8:08 AM 04/03/2022    9:15 AM 03/16/2022    9:17 AM  BP/Weight  Systolic BP 110 138 140 139 160 152 128  Diastolic BP 72 79 84 85 77 88 82  Wt. (Lbs) 360.08 365.4   357.12 372.4   BMI 63.79 kg/m2 64.73 kg/m2   63.26 kg/m2 65.97 kg/m2       10/24/2022    8:40 AM 10/11/2021    4:00 PM  Foot/eye exam completion dates  Foot Form Completion Done Done

## 2022-10-25 NOTE — Assessment & Plan Note (Signed)
Hyperlipidemia:Low fat diet discussed and encouraged.   Lipid Panel  Lab Results  Component Value Date   CHOL 159 08/03/2022   HDL 87 08/03/2022   LDLCALC 58 08/03/2022   TRIG 74 08/03/2022   CHOLHDL 1.8 08/03/2022     Controlled, no change in medication

## 2022-10-25 NOTE — Assessment & Plan Note (Signed)
Controlled, no change in medication Isabella Matthews is reminded of the importance of commitment to daily physical activity for 30 minutes or more, as able and the need to limit carbohydrate intake to 30 to 60 grams per meal to help with blood sugar control.   The need to take medication as prescribed, test blood sugar as directed, and to call between visits if there is a concern that blood sugar is uncontrolled is also discussed.   Isabella Matthews is reminded of the importance of daily foot exam, annual eye examination, and good blood sugar, blood pressure and cholesterol control.     Latest Ref Rng & Units 10/19/2022    3:15 PM 08/09/2022    9:06 AM 08/03/2022    8:08 AM 07/13/2022    7:58 AM 04/05/2022   10:10 AM  Diabetic Labs  HbA1c 0.0 - 7.0 %  6.8    6.8   Micro/Creat Ratio 0 - 29 mg/g creat 18       Chol 100 - 199 mg/dL   829  562    HDL >13 mg/dL   87  89    Calc LDL 0 - 99 mg/dL   58  74    Triglycerides 0 - 149 mg/dL   74  086    Creatinine 0.57 - 1.00 mg/dL 5.78   4.69  6.29        10/24/2022    8:42 AM 08/09/2022    8:52 AM 07/18/2022    8:23 AM 07/18/2022    8:11 AM 07/18/2022    8:08 AM 04/03/2022    9:15 AM 03/16/2022    9:17 AM  BP/Weight  Systolic BP 110 138 140 139 160 152 128  Diastolic BP 72 79 84 85 77 88 82  Wt. (Lbs) 360.08 365.4   357.12 372.4   BMI 63.79 kg/m2 64.73 kg/m2   63.26 kg/m2 65.97 kg/m2       10/24/2022    8:40 AM 10/11/2021    4:00 PM  Foot/eye exam completion dates  Foot Form Completion Done Done      Manged by Endo

## 2022-11-02 ENCOUNTER — Other Ambulatory Visit (HOSPITAL_COMMUNITY): Payer: Self-pay

## 2022-12-26 ENCOUNTER — Ambulatory Visit (INDEPENDENT_AMBULATORY_CARE_PROVIDER_SITE_OTHER): Payer: 59 | Admitting: "Endocrinology

## 2022-12-26 ENCOUNTER — Encounter: Payer: Self-pay | Admitting: "Endocrinology

## 2022-12-26 VITALS — BP 122/80 | HR 84 | Ht 63.0 in | Wt 364.8 lb

## 2022-12-26 DIAGNOSIS — I10 Essential (primary) hypertension: Secondary | ICD-10-CM

## 2022-12-26 DIAGNOSIS — E1121 Type 2 diabetes mellitus with diabetic nephropathy: Secondary | ICD-10-CM

## 2022-12-26 DIAGNOSIS — Z794 Long term (current) use of insulin: Secondary | ICD-10-CM

## 2022-12-26 DIAGNOSIS — E559 Vitamin D deficiency, unspecified: Secondary | ICD-10-CM

## 2022-12-26 DIAGNOSIS — E782 Mixed hyperlipidemia: Secondary | ICD-10-CM

## 2022-12-26 DIAGNOSIS — Z7985 Long-term (current) use of injectable non-insulin antidiabetic drugs: Secondary | ICD-10-CM

## 2022-12-26 LAB — POCT GLYCOSYLATED HEMOGLOBIN (HGB A1C): HbA1c, POC (controlled diabetic range): 7.1 % — AB (ref 0.0–7.0)

## 2022-12-26 MED ORDER — TIRZEPATIDE 2.5 MG/0.5ML ~~LOC~~ SOAJ: 2.5000 mg | SUBCUTANEOUS | 0 refills | Status: DC

## 2022-12-26 NOTE — Patient Instructions (Signed)

## 2022-12-26 NOTE — Progress Notes (Signed)
12/26/2022, 9:25 AM                                 Endocrinology follow-up note   Subjective:    Patient ID: Isabella Matthews, female    DOB: Mar 15, 1970.  Isabella Matthews is being seen in follow-up for the management of currently controlled type 2 diabetes, hyperlipidemia, hypertension. PMD: Kerri Perches, MD.   Past Medical History:  Diagnosis Date   Aortic stenosis, mild    Aortic valve disorders    Diabetes mellitus    Diabetes mellitus without complication (HCC)    Phreesia 02/08/2020   Edema    Essential hypertension    Hypertension    Phreesia 02/08/2020   Hypertension associated with type 2 diabetes mellitus (HCC) 03/05/2006   Qualifier: Diagnosis of   By: Erby Pian MD, [name]         Morbid obesity (HCC)    Onychomycosis of toenail     Past Surgical History:  Procedure Laterality Date   CESAREAN SECTION     x2    Social History   Socioeconomic History   Marital status: Single    Spouse name: Not on file   Number of children: Not on file   Years of education: Not on file   Highest education level: Not on file  Occupational History   Occupation: CNA - [employer]   Tobacco Use   Smoking status: Former    Current packs/day: 0.00    Types: Cigarettes    Quit date: 11/23/2014    Years since quitting: 8.0   Smokeless tobacco: Never  Vaping Use   Vaping status: Never Used  Substance and Sexual Activity   Alcohol use: Yes    Alcohol/week: 0.0 standard drinks of alcohol    Comment: occasionally   Drug use: No   Sexual activity: Not on file  Other Topics Concern   Not on file  Social History Narrative   Not on file   Social Determinants of Health   Financial Resource Strain: Not on file  Food Insecurity: Not on file  Transportation Needs: Not on file  Physical Activity: Not on file  Stress: Not on file  Social Connections: Not on file    Family History  Problem Relation Age of Onset   Hypertension Mother     Hypertension Father    Diabetes Father     Outpatient Encounter Medications as of 12/26/2022  Medication Sig   tirzepatide (MOUNJARO) 2.5 MG/0.5ML Pen Inject 2.5 mg into the skin once a week.   amLODipine (NORVASC) 10 MG tablet TAKE ONE TABLET BY MOUTH ONCE DAILY *INCREASED DOSAGE*   aspirin EC 81 MG tablet Take 1 tablet (81 mg total) by mouth daily. Swallow whole.   Cholecalciferol (VITAMIN D3) 125 MCG (5000 UT) CAPS Take 1 capsule (5,000 Units total) by mouth daily.   furosemide (LASIX) 20 MG tablet Take one tablet by mouth every other day   gabapentin (NEURONTIN) 300 MG capsule Take 1 capsule (300 mg total) by mouth at bedtime.   glucose blood test strip 1 each by Other route 2 (two) times daily. Use as instructed   hydrALAZINE (APRESOLINE) 10 MG tablet Take 1 tablet (10 mg total) by mouth 3 (three) times daily.   insulin glargine, 2 Unit Dial, (TOUJEO MAX SOLOSTAR) 300 UNIT/ML Solostar Pen Inject 70 Units into the skin at bedtime.  losartan (COZAAR) 100 MG tablet Take 1 tablet (100 mg total) by mouth daily.   Multiple Vitamin (MULTIVITAMIN) tablet Take 1 tablet by mouth daily.   rosuvastatin (CRESTOR) 20 MG tablet Take 1 tablet (20 mg total) by mouth daily.   triamcinolone cream (KENALOG) 0.1 % Apply 1 Application topically 2 (two) times daily.   [DISCONTINUED] Dulaglutide (TRULICITY) 4.5 MG/0.5ML SOPN Inject 4.5 mg into the skin once a week.   No facility-administered encounter medications on file as of 12/26/2022.    ALLERGIES: Allergies  Allergen Reactions   Penicillins Swelling    Has patient had a PCN reaction causing immediate rash, facial/tongue/throat swelling, SOB or lightheadedness with hypotension: Yes Has patient had a PCN reaction causing severe rash involving mucus membranes or skin necrosis: No Has patient had a PCN reaction that required hospitalization: No Has patient had a PCN reaction occurring within the last 10 years: Yes If all of the above answers are  "NO", then may proceed with Cephalosporin use. Leg swelling/redness   Ace Inhibitors Cough   Metformin And Related Nausea Only   Tramadol Rash    Scant rash on hands  After starting tramadol for knee pain in Jan 16, 2013    VACCINATION STATUS: Immunization History  Administered Date(s) Administered   Influenza Split 10/27/2013   Influenza Whole 12/29/2005, 09/29/2009   Influenza, Seasonal, Injecte, Preservative Fre 10/24/2022   Influenza,inj,Quad PF,6+ Mos 10/17/2012, 10/24/2016, 10/01/2018, 10/08/2019, 03/03/2021, 10/11/2021   Moderna Sars-Covid-2 Vaccination 01/28/2019, 12/16/2019, 07/22/2020   Pneumococcal Polysaccharide-23 01/12/2006, 07/09/2015   Tdap 08/12/2010, 09/08/2020   Zoster Recombinant(Shingrix) 07/23/2019, 10/08/2019    Diabetes She presents for her follow-up diabetic visit. She has type 2 diabetes mellitus. Onset time: She was diagnosed at approximate age of 52 years, she did have an episode of gestational diabetes. Her disease course has been worsening. There are no hypoglycemic associated symptoms. Pertinent negatives for hypoglycemia include no confusion, headaches, pallor or seizures. Pertinent negatives for diabetes include no chest pain, no fatigue, no polydipsia, no polyphagia and no polyuria. There are no hypoglycemic complications. Symptoms are worsening. Diabetic complications include peripheral neuropathy. Risk factors for coronary artery disease include diabetes mellitus, dyslipidemia, family history, hypertension, obesity and sedentary lifestyle. Current diabetic treatment includes insulin injections and oral agent (monotherapy). She is compliant with treatment most of the time. Her weight is fluctuating minimally. She is following a generally unhealthy diet. When asked about meal planning, she reported none. She has not had a previous visit with a dietitian. She rarely participates in exercise. Her home blood glucose trend is increasing steadily. Her breakfast  blood glucose range is generally 110-130 mg/dl. Her bedtime blood glucose range is generally 140-180 mg/dl. Her overall blood glucose range is 140-180 mg/dl. Isabella Matthews presents with controlled fasting glycemic profile.  Her point-of-care A1c is 7.1% increasing from 6.8% during her last visit.    She did not document any hypoglycemia.  ) An ACE inhibitor/angiotensin II receptor blocker is being taken. Eye exam is current.  Hyperlipidemia This is a chronic problem. The current episode started more than 1 year ago. The problem is controlled. Exacerbating diseases include diabetes and obesity. Pertinent negatives include no chest pain, myalgias or shortness of breath. Current antihyperlipidemic treatment includes statins. Risk factors for coronary artery disease include dyslipidemia, family history, obesity, hypertension, a sedentary lifestyle and diabetes mellitus.  Hypertension This is a chronic problem. The current episode started more than 1 year ago. Pertinent negatives include no chest pain, headaches, palpitations or shortness of breath.  Risk factors for coronary artery disease include diabetes mellitus, dyslipidemia, obesity and sedentary lifestyle. Past treatments include angiotensin blockers.     Review of systems: Limited as above.  Objective:    BP 122/80   Pulse 84   Ht 5\' 3"  (1.6 m)   Wt (!) 364 lb 12.8 oz (165.5 kg)   LMP  (LMP Unknown)   BMI 64.62 kg/m   Wt Readings from Last 3 Encounters:  12/26/22 (!) 364 lb 12.8 oz (165.5 kg)  10/24/22 (!) 360 lb 1.3 oz (163.3 kg)  08/09/22 (!) 365 lb 6.4 oz (165.7 kg)     Physical Exam- Limited  Constitutional:  Body mass index is 64.62 kg/m. , not in acute distress, normal state of mind    CMP ( most recent) CMP     Component Value Date/Time   NA 141 10/19/2022 1515   K 4.5 10/19/2022 1515   CL 101 10/19/2022 1515   CO2 22 10/19/2022 1515   GLUCOSE 160 (H) 10/19/2022 1515   GLUCOSE 138 (H) 06/19/2019 0721   BUN 16 10/19/2022  1515   CREATININE 0.72 10/19/2022 1515   CREATININE 0.87 06/19/2019 0721   CALCIUM 10.1 10/19/2022 1515   PROT 7.2 08/03/2022 0808   ALBUMIN 4.4 08/03/2022 0808   AST 23 08/03/2022 0808   ALT 22 08/03/2022 0808   ALKPHOS 87 08/03/2022 0808   BILITOT 0.4 08/03/2022 0808   GFRNONAA 70 02/02/2020 1128   GFRNONAA 101 02/14/2019 0848   GFRAA 81 02/02/2020 1128   GFRAA 117 02/14/2019 0848    Diabetic Labs (most recent): Lab Results  Component Value Date   HGBA1C 7.1 (A) 12/26/2022   HGBA1C 6.8 08/09/2022   HGBA1C 6.8 04/05/2022   MICROALBUR 30 09/01/2021   MICROALBUR 3.0 (H) 07/23/2019   MICROALBUR 33.3 01/27/2017     Lipid Panel ( most recent) Lipid Panel     Component Value Date/Time   CHOL 159 08/03/2022 0808   TRIG 74 08/03/2022 0808   HDL 87 08/03/2022 0808   CHOLHDL 1.8 08/03/2022 0808   CHOLHDL 2.2 07/23/2019 1010   VLDL 22 03/16/2016 0808   LDLCALC 58 08/03/2022 0808   LDLCALC 89 07/23/2019 1010   LABVLDL 14 08/03/2022 0808      Lab Results  Component Value Date   TSH 0.652 10/19/2022   TSH 0.993 01/19/2022   TSH 0.838 09/17/2020   TSH 0.98 06/19/2019   TSH 0.88 02/14/2019   TSH 0.96 01/27/2017   TSH 1.18 11/05/2015   TSH 1.603 07/10/2014   TSH 1.152 10/14/2012   TSH 1.202 10/14/2012   FREET4 1.36 09/17/2020   FREET4 1.2 06/19/2019   FREET4 1.2 02/14/2019     Assessment & Plan:   1. Type 2 diabetes mellitus with diabetic nephropathy, with long-term current use of insulin (HCC)   - LYMARI AZUMA has currently uncontrolled symptomatic type 2 DM since  52 years of age.  Velvet presents with controlled fasting glycemic profile.  Her point-of-care A1c is 7.1% increasing from 6.8% during her last visit.    She did not document any hypoglycemia.    -Her recent labs were reviewed with her. - I had a long discussion with her about the progressive nature of diabetes and the pathology behind its complications. -her diabetes is complicated by  neuropathy, obesity/sedentary life and she remains at a high risk for more acute and chronic complications which include CAD, CVA, CKD, retinopathy, and neuropathy. These are all discussed in detail with her.  -  I have counseled her on diet  and weight management  by adopting a carbohydrate restricted/protein rich diet. Patient is encouraged to switch to  unprocessed or minimally processed     complex starch and increased protein intake (animal or plant source), fruits, and vegetables. -Admittedly, she has not engaged optimally to lifestyle medicine, however, she wishes to try again.  - she acknowledges that there is a room for improvement in her food and drink choices. - Suggestion is made for her to avoid simple carbohydrates  from her diet including Cakes, Sweet Desserts, Ice Cream, Soda (diet and regular), Sweet Tea, Candies, Chips, Cookies, Store Bought Juices, Alcohol , Artificial Sweeteners,  Coffee Creamer, and "Sugar-free" Products, Lemonade. This will help patient to have more stable blood glucose profile and potentially avoid unintended weight gain.  The following Lifestyle Medicine recommendations according to American College of Lifestyle Medicine  [facility]) were discussed and and offered to patient and she  agrees to start the journey:  A. Whole Foods, Plant-Based Nutrition comprising of fruits and vegetables, plant-based proteins, whole-grain carbohydrates was discussed in detail with the patient.   A list for source of those nutrients were also provided to the patient.  Patient will use only water or unsweetened tea for hydration. B.  The need to stay away from risky substances including alcohol, smoking; obtaining 7 to 9 hours of restorative sleep, at least 150 minutes of moderate intensity exercise weekly, the importance of healthy social connections,  and stress management techniques were discussed. C.  A full color page of  Calorie density of various food groups per pound showing  examples of each food groups was provided to the patient.    - she has been scheduled with Norm Salt, RDN, CDE for diabetes education.  - I have approached her with the following individualized plan to manage  her diabetes and patient agrees:   In light of her presentation with near target fasting glycemic profile, she will not tolerate any higher dose of basal insulin.  She is advised to continue Toujeo 70 units nightly,  associated with monitoring of blood glucose twice a day-daily before breakfast and at bedtime.   - she is warned not to take insulin without proper monitoring per orders. - she is encouraged to call clinic for blood glucose levels less than 70 or above 200 mg /dl.  -She reports GI intolerance from metformin.  She may benefit more from [facility] than Trulicity.  I discussed and switched her Trulicity to Mounjaro 2.5 mg subcutaneously weekly.  This medication will be advanced as she tolerates    - Specific targets for  A1c;  LDL, HDL, Triglycerides, and  Waist Circumference were discussed with the patient.  2) Blood Pressure /Hypertension:  -Her blood pressure is controlled to target.  She is advised to continue losartan 100 mg p.o. daily, amlodipine 5 mg p.o. daily at breakfast.  She also has hydralazine 10 mg p.o. 3 times daily.  The above detailed lifestyle nutrition will help with hypertension and hyperlipidemia.  3) Lipids/Hyperlipidemia: Uncontrolled, recent fasting lipid panel showed improved LDL of 60 from 110.  She is advised to continue Crestor 20 mg p.o. nightly.     Whole food plant-based diet will help with dyslipidemia as well.     Side effects and precautions discussed with her.  4)  Weight/Diet: Her BMI is 64.62--- clearly complicating her diabetes care.   she is  a candidate for weight loss. I discussed with her the fact that  loss of 5 - 10% of her  current body weight will have the most impact on her diabetes management.  Exercise, and detailed  carbohydrates information provided  -  detailed on discharge instructions.  She is a perfect candidate for whole food plant-based diet described above.  She hesitates to consider surgical option at this time. -She may benefit from bariatric surgery, will be discussed in more detail on subsequent visit.  5) vitamin D deficiency She is on ongoing supplement with vitamin D3 5000 units daily.  She is now vitamin D replete at 46.  She is advised to continue.  6) Chronic Care/Health Maintenance:  -she  is on ACEI/ARB and Statin medications and  is encouraged to initiate and continue to follow up with Ophthalmology, Dentist,  Podiatrist at least yearly or according to recommendations, and advised to   stay away from smoking. I have recommended yearly flu vaccine and pneumonia vaccine at least every 5 years; moderate intensity exercise for up to 150 minutes weekly; and  sleep for at least 7 hours a day.  - she is  advised to maintain close follow up with Kerri Perches, MD for primary care needs, as well as her other providers for optimal and coordinated care.   I spent  42  minutes in the care of the patient today including review of labs from CMP, Lipids, Thyroid Function, Hematology (current and previous including abstractions from other facilities); face-to-face time discussing  her blood glucose readings/logs, discussing hypoglycemia and hyperglycemia episodes and symptoms, medications doses, her options of short and long term treatment based on the latest standards of care / guidelines;  discussion about incorporating lifestyle medicine;  and documenting the encounter. Risk reduction counseling performed per USPSTF guidelines to reduce  obesity and cardiovascular risk factors.     Please refer to Patient Instructions for Blood Glucose Monitoring and Insulin/Medications Dosing Guide"  in media tab for additional information. Please  also refer to " Patient Self Inventory" in the Media  tab for  reviewed elements of pertinent patient history.  Celesta Aver participated in the discussions, expressed understanding, and voiced agreement with the above plans.  All questions were answered to her satisfaction. she is encouraged to contact clinic should she have any questions or concerns prior to her return visit.     Follow up plan: - Return in about 4 months (around 04/26/2023) for F/U with Pre-visit Labs, Meter/CGM/Logs, A1c here.  Marquis Lunch, MD [facility] [address] [place], [place] [address] Phone: [phone]  Fax: [phone]    12/26/2022, 9:25 AM  This note was partially dictated with voice recognition software. Similar sounding words can be transcribed inadequately or may not  be corrected upon review.

## 2023-02-21 ENCOUNTER — Encounter: Payer: 59 | Admitting: Family Medicine

## 2023-03-28 ENCOUNTER — Telehealth: Payer: Self-pay

## 2023-03-28 NOTE — Telephone Encounter (Signed)
 Left a message requesting pt return call to the office.

## 2023-03-30 ENCOUNTER — Other Ambulatory Visit: Payer: Self-pay | Admitting: "Endocrinology

## 2023-04-12 ENCOUNTER — Other Ambulatory Visit: Payer: Self-pay

## 2023-04-12 DIAGNOSIS — Z794 Long term (current) use of insulin: Secondary | ICD-10-CM

## 2023-04-12 MED ORDER — TRESIBA FLEXTOUCH 200 UNIT/ML ~~LOC~~ SOPN: 70.0000 [IU] | PEN_INJECTOR | Freq: Every day | SUBCUTANEOUS | 0 refills | Status: DC

## 2023-04-16 ENCOUNTER — Ambulatory Visit (HOSPITAL_COMMUNITY): Payer: 59

## 2023-04-19 LAB — COMPREHENSIVE METABOLIC PANEL WITH GFR
ALT: 24 IU/L (ref 0–32)
AST: 27 IU/L (ref 0–40)
Albumin: 4.4 g/dL (ref 3.8–4.9)
Alkaline Phosphatase: 88 IU/L (ref 44–121)
BUN/Creatinine Ratio: 15 (ref 9–23)
BUN: 12 mg/dL (ref 6–24)
Bilirubin Total: 0.6 mg/dL (ref 0.0–1.2)
CO2: 23 mmol/L (ref 20–29)
Calcium: 9.9 mg/dL (ref 8.7–10.2)
Chloride: 100 mmol/L (ref 96–106)
Creatinine, Ser: 0.8 mg/dL (ref 0.57–1.00)
Globulin, Total: 3.2 g/dL (ref 1.5–4.5)
Glucose: 152 mg/dL — ABNORMAL HIGH (ref 70–99)
Potassium: 4.3 mmol/L (ref 3.5–5.2)
Sodium: 141 mmol/L (ref 134–144)
Total Protein: 7.6 g/dL (ref 6.0–8.5)
eGFR: 88 mL/min/{1.73_m2} (ref >59)

## 2023-04-26 ENCOUNTER — Ambulatory Visit (INDEPENDENT_AMBULATORY_CARE_PROVIDER_SITE_OTHER): Payer: 59 | Admitting: "Endocrinology

## 2023-04-26 ENCOUNTER — Encounter: Payer: Self-pay | Admitting: "Endocrinology

## 2023-04-26 VITALS — BP 128/74 | HR 64 | Ht 63.0 in | Wt 360.8 lb

## 2023-04-26 DIAGNOSIS — Z794 Long term (current) use of insulin: Secondary | ICD-10-CM

## 2023-04-26 DIAGNOSIS — E1121 Type 2 diabetes mellitus with diabetic nephropathy: Secondary | ICD-10-CM | POA: Diagnosis not present

## 2023-04-26 DIAGNOSIS — I10 Essential (primary) hypertension: Secondary | ICD-10-CM

## 2023-04-26 DIAGNOSIS — E782 Mixed hyperlipidemia: Secondary | ICD-10-CM

## 2023-04-26 DIAGNOSIS — E559 Vitamin D deficiency, unspecified: Secondary | ICD-10-CM | POA: Diagnosis not present

## 2023-04-26 DIAGNOSIS — Z7985 Long-term (current) use of injectable non-insulin antidiabetic drugs: Secondary | ICD-10-CM

## 2023-04-26 LAB — POCT GLYCOSYLATED HEMOGLOBIN (HGB A1C)

## 2023-04-26 MED ORDER — TRULICITY 1.5 MG/0.5ML ~~LOC~~ SOAJ: 1.5000 mg | SUBCUTANEOUS | 1 refills | Status: DC

## 2023-04-26 MED ORDER — TRESIBA FLEXTOUCH 200 UNIT/ML ~~LOC~~ SOPN
70.0000 [IU] | PEN_INJECTOR | Freq: Every day | SUBCUTANEOUS | 0 refills | Status: DC
Start: 2023-04-26 — End: 2023-05-08

## 2023-04-26 NOTE — Progress Notes (Signed)
 04/26/2023, 5:00 PM                                 Endocrinology follow-up note   Subjective:    Patient ID: Isabella Matthews, female    DOB: 1970/06/22.  Isabella Matthews is being seen in follow-up for the management of currently controlled type 2 diabetes, hyperlipidemia, hypertension. PMD: Kerri Perches, MD.   Past Medical History:  Diagnosis Date   Aortic stenosis, mild    Aortic valve disorders    Diabetes mellitus    Diabetes mellitus without complication (HCC)    Phreesia 02/08/2020   Edema    Essential hypertension    Hypertension    Phreesia 02/08/2020   Hypertension associated with type 2 diabetes mellitus (HCC) 03/05/2006   Qualifier: Diagnosis of   By: Erby Pian MD, [name]         Morbid obesity (HCC)    Onychomycosis of toenail     Past Surgical History:  Procedure Laterality Date   CESAREAN SECTION     x2    Social History   Socioeconomic History   Marital status: Single    Spouse name: Not on file   Number of children: Not on file   Years of education: Not on file   Highest education level: Not on file  Occupational History   Occupation: CNA - [employer]   Tobacco Use   Smoking status: Former    Current packs/day: 0.00    Types: Cigarettes    Quit date: 11/23/2014    Years since quitting: 8.4   Smokeless tobacco: Never  Vaping Use   Vaping status: Never Used  Substance and Sexual Activity   Alcohol use: Yes    Alcohol/week: 0.0 standard drinks of alcohol    Comment: occasionally   Drug use: No   Sexual activity: Not on file  Other Topics Concern   Not on file  Social History Narrative   Not on file   Social Drivers of Health   Financial Resource Strain: Not on file  Food Insecurity: Not on file  Transportation Needs: Not on file  Physical Activity: Not on file  Stress: Not on file  Social Connections: Not on file    Family History  Problem Relation Age of Onset   Hypertension Mother     Hypertension Father    Diabetes Father     Outpatient Encounter Medications as of 04/26/2023  Medication Sig   Dulaglutide (TRULICITY) 1.5 MG/0.5ML SOAJ Inject 1.5 mg into the skin once a week.   amLODipine (NORVASC) 10 MG tablet TAKE ONE TABLET BY MOUTH ONCE DAILY *INCREASED DOSAGE*   aspirin EC 81 MG tablet Take 1 tablet (81 mg total) by mouth daily. Swallow whole.   Cholecalciferol (VITAMIN D3) 125 MCG (5000 UT) CAPS Take 1 capsule (5,000 Units total) by mouth daily.   furosemide (LASIX) 20 MG tablet Take one tablet by mouth every other day   gabapentin (NEURONTIN) 300 MG capsule Take 1 capsule (300 mg total) by mouth at bedtime.   glucose blood test strip 1 each by Other route 2 (two) times daily. Use as instructed   hydrALAZINE (APRESOLINE) 10 MG tablet Take 1 tablet (10 mg total) by mouth 3 (three) times daily.   insulin degludec (TRESIBA FLEXTOUCH) 200 UNIT/ML FlexTouch Pen Inject 70 Units into the skin at bedtime.   losartan (COZAAR) 100 MG  tablet Take 1 tablet (100 mg total) by mouth daily.   Multiple Vitamin (MULTIVITAMIN) tablet Take 1 tablet by mouth daily.   rosuvastatin (CRESTOR) 20 MG tablet Take 1 tablet (20 mg total) by mouth daily.   tirzepatide [facility]) 2.5 MG/0.5ML Pen Inject 2.5 mg into the skin once a week. (Patient not taking: Reported on 04/26/2023)   triamcinolone cream (KENALOG) 0.1 % Apply 1 Application topically 2 (two) times daily.   [DISCONTINUED] insulin degludec (TRESIBA FLEXTOUCH) 200 UNIT/ML FlexTouch Pen Inject 70 Units into the skin at bedtime.   No facility-administered encounter medications on file as of 04/26/2023.    ALLERGIES: Allergies  Allergen Reactions   Penicillins Swelling    Has patient had a PCN reaction causing immediate rash, facial/tongue/throat swelling, SOB or lightheadedness with hypotension: Yes Has patient had a PCN reaction causing severe rash involving mucus membranes or skin necrosis: No Has patient had a PCN reaction that  required hospitalization: No Has patient had a PCN reaction occurring within the last 10 years: Yes If all of the above answers are "NO", then may proceed with Cephalosporin use. Leg swelling/redness   Ace Inhibitors Cough   Metformin And Related Nausea Only   Tramadol Rash    Scant rash on hands  After starting tramadol for knee pain in Jan 16, 2013    VACCINATION STATUS: Immunization History  Administered Date(s) Administered   Influenza Split 10/27/2013   Influenza Whole 12/29/2005, 09/29/2009   Influenza, Seasonal, Injecte, Preservative Fre 10/24/2022   Influenza,inj,Quad PF,6+ Mos 10/17/2012, 10/24/2016, 10/01/2018, 10/08/2019, 03/03/2021, 10/11/2021   Moderna Sars-Covid-2 Vaccination 01/28/2019, 12/16/2019, 07/22/2020   Pneumococcal Polysaccharide-23 01/12/2006, 07/09/2015   Tdap 08/12/2010, 09/08/2020   Zoster Recombinant(Shingrix) 07/23/2019, 10/08/2019    Diabetes She presents for her follow-up diabetic visit. She has type 2 diabetes mellitus. Onset time: She was diagnosed at approximate age of 63 years, she did have an episode of gestational diabetes. Her disease course has been worsening. There are no hypoglycemic associated symptoms. Pertinent negatives for hypoglycemia include no confusion, headaches, pallor or seizures. Pertinent negatives for diabetes include no chest pain, no fatigue, no polydipsia, no polyphagia and no polyuria. There are no hypoglycemic complications. Symptoms are worsening. Diabetic complications include peripheral neuropathy. Risk factors for coronary artery disease include diabetes mellitus, dyslipidemia, family history, hypertension, obesity and sedentary lifestyle. Current diabetic treatment includes insulin injections and oral agent (monotherapy). She is compliant with treatment most of the time. Her weight is fluctuating minimally. She is following a generally unhealthy diet. When asked about meal planning, she reported none. She has not had a  previous visit with a dietitian. She rarely participates in exercise. Her home blood glucose trend is increasing steadily. Her breakfast blood glucose range is generally 130-140 mg/dl. Her bedtime blood glucose range is generally 140-180 mg/dl. Her overall blood glucose range is 140-180 mg/dl. Isabella Matthews presents with increasing glycemic profile above target.  Her point-of-care A1c is 7.4% increasing from 6.8%.  She did not document hypoglycemia.   ) An ACE inhibitor/angiotensin II receptor blocker is being taken. Eye exam is current.  Hyperlipidemia This is a chronic problem. The current episode started more than 1 year ago. The problem is controlled. Exacerbating diseases include diabetes and obesity. Pertinent negatives include no chest pain, myalgias or shortness of breath. Current antihyperlipidemic treatment includes statins. Risk factors for coronary artery disease include dyslipidemia, family history, obesity, hypertension, a sedentary lifestyle and diabetes mellitus.  Hypertension This is a chronic problem. The current episode started more than  1 year ago. Pertinent negatives include no chest pain, headaches, palpitations or shortness of breath. Risk factors for coronary artery disease include diabetes mellitus, dyslipidemia, obesity and sedentary lifestyle. Past treatments include angiotensin blockers.     Review of systems: Limited as above.  Objective:    BP 128/74   Pulse 64   Ht 5\' 3"  (1.6 m)   Wt (!) 360 lb 12.8 oz (163.7 kg)   LMP  (LMP Unknown)   BMI 63.91 kg/m   Wt Readings from Last 3 Encounters:  04/26/23 (!) 360 lb 12.8 oz (163.7 kg)  12/26/22 (!) 364 lb 12.8 oz (165.5 kg)  10/24/22 (!) 360 lb 1.3 oz (163.3 kg)     Physical Exam- Limited  Constitutional:  Body mass index is 63.91 kg/m. , not in acute distress, normal state of mind    CMP ( most recent) CMP     Component Value Date/Time   NA 141 04/18/2023 1038   K 4.3 04/18/2023 1038   CL 100 04/18/2023 1038    CO2 23 04/18/2023 1038   GLUCOSE 152 (H) 04/18/2023 1038   GLUCOSE 138 (H) 06/19/2019 0721   BUN 12 04/18/2023 1038   CREATININE 0.80 04/18/2023 1038   CREATININE 0.87 06/19/2019 0721   CALCIUM 9.9 04/18/2023 1038   PROT 7.6 04/18/2023 1038   ALBUMIN 4.4 04/18/2023 1038   AST 27 04/18/2023 1038   ALT 24 04/18/2023 1038   ALKPHOS 88 04/18/2023 1038   BILITOT 0.6 04/18/2023 1038   GFRNONAA 70 02/02/2020 1128   GFRNONAA 101 02/14/2019 0848   GFRAA 81 02/02/2020 1128   GFRAA 117 02/14/2019 0848    Diabetic Labs (most recent): Lab Results  Component Value Date   HGBA1C 7.1 (A) 12/26/2022   HGBA1C 6.8 08/09/2022   HGBA1C 6.8 04/05/2022   MICROALBUR 30 09/01/2021   MICROALBUR 3.0 (H) 07/23/2019   MICROALBUR 33.3 01/27/2017     Lipid Panel ( most recent) Lipid Panel     Component Value Date/Time   CHOL 159 08/03/2022 0808   TRIG 74 08/03/2022 0808   HDL 87 08/03/2022 0808   CHOLHDL 1.8 08/03/2022 0808   CHOLHDL 2.2 07/23/2019 1010   VLDL 22 03/16/2016 0808   LDLCALC 58 08/03/2022 0808   LDLCALC 89 07/23/2019 1010   LABVLDL 14 08/03/2022 0808     Lab Results  Component Value Date   TSH 0.652 10/19/2022   TSH 0.993 01/19/2022   TSH 0.838 09/17/2020   TSH 0.98 06/19/2019   TSH 0.88 02/14/2019   TSH 0.96 01/27/2017   TSH 1.18 11/05/2015   TSH 1.603 07/10/2014   TSH 1.152 10/14/2012   TSH 1.202 10/14/2012   FREET4 1.36 09/17/2020   FREET4 1.2 06/19/2019   FREET4 1.2 02/14/2019     Assessment & Plan:   1. Type 2 diabetes mellitus with diabetic nephropathy, with long-term current use of insulin (HCC)   - Isabella Matthews has currently uncontrolled symptomatic type 2 DM since  53 years of age.  Isabella Matthews presents with increasing glycemic profile above target.  Her point-of-care A1c is 7.4% increasing from 6.8%.  She did not document hypoglycemia.    -Her recent labs were reviewed with her. - I had a long discussion with her about the progressive nature of diabetes  and the pathology behind its complications. -her diabetes is complicated by neuropathy, obesity/sedentary life and she remains at a high risk for more acute and chronic complications which include CAD, CVA, CKD, retinopathy, and neuropathy.  These are all discussed in detail with her.  - I have counseled her on diet  and weight management  by adopting a carbohydrate restricted/protein rich diet. Patient is encouraged to switch to  unprocessed or minimally processed     complex starch and increased protein intake (animal or plant source), fruits, and vegetables. -Admittedly, she has not engaged optimally to lifestyle medicine, however, she wishes to try again.  - she acknowledges that there is a room for improvement in her food and drink choices. - Suggestion is made for her to avoid simple carbohydrates  from her diet including Cakes, Sweet Desserts, Ice Cream, Soda (diet and regular), Sweet Tea, Candies, Chips, Cookies, Store Bought Juices, Alcohol , Artificial Sweeteners,  Coffee Creamer, and "Sugar-free" Products, Lemonade. This will help patient to have more stable blood glucose profile and potentially avoid unintended weight gain.  The following Lifestyle Medicine recommendations according to American College of Lifestyle Medicine  [facility]) were discussed and and offered to patient and she  agrees to start the journey:  A. Whole Foods, Plant-Based Nutrition comprising of fruits and vegetables, plant-based proteins, whole-grain carbohydrates was discussed in detail with the patient.   A list for source of those nutrients were also provided to the patient.  Patient will use only water or unsweetened tea for hydration. B.  The need to stay away from risky substances including alcohol, smoking; obtaining 7 to 9 hours of restorative sleep, at least 150 minutes of moderate intensity exercise weekly, the importance of healthy social connections,  and stress management techniques were discussed. C.  A full  color page of  Calorie density of various food groups per pound showing examples of each food groups was provided to the patient.   - she has been scheduled with Norm Salt, RDN, CDE for diabetes education.  - I have approached her with the following individualized plan to manage  her diabetes and patient agrees:   In light of her presentation with above target glycemic profile, she will need a additional intervention besides her basal insulin.  Her insurance did not provide coverage for Mounjaro.  She will be resumed on Trulicity at 1.5 mg subcutaneously weekly.  Side effects and precautions discussed with her.  Trulicity would be advanced as she tolerates. - She is advised to continue Tresiba 70 units nightly  associated with monitoring of blood glucose twice a day-daily before breakfast and at bedtime.   - she is warned not to take insulin without proper monitoring per orders. - she is encouraged to call clinic for blood glucose levels less than 70 or above 200 mg /dl.  -She reports GI intolerance from metformin.     - Specific targets for  A1c;  LDL, HDL, Triglycerides, and  Waist Circumference were discussed with the patient.  2) Blood Pressure /Hypertension:  Her blood pressure is controlled to target.  She is advised to continue losartan 100 mg p.o. daily, amlodipine 5 mg p.o. daily at breakfast.  She also has hydralazine 10 mg p.o. 3 times daily.  The above detailed lifestyle nutrition will help with hypertension and hyperlipidemia.  3) Lipids/Hyperlipidemia: She presents with controlled lipid panel with LDL at 58.  She is advised to continue Crestor 20 mg p.o. nightly.     Whole food plant-based diet will help with dyslipidemia as well.     Side effects and precautions discussed with her.  4)  Weight/Diet: Her BMI is 63.91---- clearly complicating her diabetes care.   she is  a candidate for weight loss. I discussed with her the fact that loss of 5 - 10% of her  current body  weight will have the most impact on her diabetes management.  Exercise, and detailed carbohydrates information provided  -  detailed on discharge instructions.  She is a perfect candidate for whole food plant-based diet described above.  She is also a good candidate for bariatric surgery, however she hesitates to consider this option at this time.    5) vitamin D deficiency She is on ongoing supplement with vitamin D3 5000 units daily.  She is now vitamin D replete at 46.  She is advised to continue.  6) Chronic Care/Health Maintenance:  -she  is on ACEI/ARB and Statin medications and  is encouraged to initiate and continue to follow up with Ophthalmology, Dentist,  Podiatrist at least yearly or according to recommendations, and advised to   stay away from smoking. I have recommended yearly flu vaccine and pneumonia vaccine at least every 5 years; moderate intensity exercise for up to 150 minutes weekly; and  sleep for at least 7 hours a day.  - she is  advised to maintain close follow up with Kerri Perches, MD for primary care needs, as well as her other providers for optimal and coordinated care.   I spent  42  minutes in the care of the patient today including review of labs from CMP, Lipids, Thyroid Function, Hematology (current and previous including abstractions from other facilities); face-to-face time discussing  her blood glucose readings/logs, discussing hypoglycemia and hyperglycemia episodes and symptoms, medications doses, her options of short and long term treatment based on the latest standards of care / guidelines;  discussion about incorporating lifestyle medicine;  and documenting the encounter. Risk reduction counseling performed per USPSTF guidelines to reduce  obesity and cardiovascular risk factors.     Please refer to Patient Instructions for Blood Glucose Monitoring and Insulin/Medications Dosing Guide"  in media tab for additional information. Please  also refer to "  Patient Self Inventory" in the Media  tab for reviewed elements of pertinent patient history.  Isabella Matthews participated in the discussions, expressed understanding, and voiced agreement with the above plans.  All questions were answered to her satisfaction. she is encouraged to contact clinic should she have any questions or concerns prior to her return visit.    Follow up plan: - Return in about 4 months (around 08/26/2023) for Bring Meter/CGM Device/Logs- A1c in Office.  Marquis Lunch, MD [facility] [address] [place], [place] [address] Phone: [phone]  Fax: [phone]    04/26/2023, 5:00 PM  This note was partially dictated with voice recognition software. Similar sounding words can be transcribed inadequately or may not  be corrected upon review.

## 2023-04-26 NOTE — Patient Instructions (Signed)

## 2023-04-30 ENCOUNTER — Other Ambulatory Visit (HOSPITAL_COMMUNITY): Payer: Self-pay

## 2023-04-30 ENCOUNTER — Telehealth: Payer: Self-pay | Admitting: Pharmacy Technician

## 2023-04-30 NOTE — Telephone Encounter (Signed)
 Pharmacy Patient Advocate Encounter  Received notification from [facility] that Prior Authorization for Tresiba FlexTouch (insulin degludec injection) 200 Units/mL solution has been APPROVED from 04/30/2023 to 04/29/2024. Ran test claim, Copay is $300.00. This test claim was processed through [facility] ([facility])- copay amounts may vary at other pharmacies due to pharmacy/plan contracts, or as the patient moves through the different stages of their insurance plan.   PA #/Case ID/Reference #: [id number]

## 2023-04-30 NOTE — Telephone Encounter (Signed)
 Pharmacy Patient Advocate Encounter   Received notification from CoverMyMeds that prior authorization for Tresiba FlexTouch (insulin degludec injection) 200 Units/mL solution is required/requested.   Insurance verification completed.   The patient is insured through [facility] .   Per test claim: PA required; PA submitted to above mentioned insurance via CoverMyMeds Key/confirmation #/EOC Burgess Amor Status is pending

## 2023-05-03 ENCOUNTER — Telehealth: Payer: Self-pay | Admitting: Family Medicine

## 2023-05-03 ENCOUNTER — Encounter: Payer: Self-pay | Admitting: Family Medicine

## 2023-05-03 ENCOUNTER — Ambulatory Visit (INDEPENDENT_AMBULATORY_CARE_PROVIDER_SITE_OTHER): Admitting: Family Medicine

## 2023-05-03 VITALS — BP 124/80 | HR 66 | Resp 18 | Ht 63.0 in | Wt 364.1 lb

## 2023-05-03 DIAGNOSIS — E1169 Type 2 diabetes mellitus with other specified complication: Secondary | ICD-10-CM

## 2023-05-03 DIAGNOSIS — E559 Vitamin D deficiency, unspecified: Secondary | ICD-10-CM

## 2023-05-03 DIAGNOSIS — Z794 Long term (current) use of insulin: Secondary | ICD-10-CM

## 2023-05-03 DIAGNOSIS — E782 Mixed hyperlipidemia: Secondary | ICD-10-CM

## 2023-05-03 DIAGNOSIS — Z23 Encounter for immunization: Secondary | ICD-10-CM | POA: Insufficient documentation

## 2023-05-03 DIAGNOSIS — I10 Essential (primary) hypertension: Secondary | ICD-10-CM

## 2023-05-03 DIAGNOSIS — Z0000 Encounter for general adult medical examination without abnormal findings: Secondary | ICD-10-CM | POA: Insufficient documentation

## 2023-05-03 NOTE — Assessment & Plan Note (Signed)
 After obtaining informed consent, the pneumonia 20  vaccine is  administered , with no adverse effect noted at the time of administration.

## 2023-05-03 NOTE — Assessment & Plan Note (Signed)

## 2023-05-03 NOTE — Telephone Encounter (Signed)
 Patient will call and schedule mammo personally [was given # to radiology] Patient needs to check schedule for off day before scheduling

## 2023-05-03 NOTE — Progress Notes (Signed)
    Isabella Matthews     MRN: [id number]      DOB: 09/14/1970  Chief Complaint  Patient presents with   Annual Exam    Annual Exam     HPI: Patient is in for annual physical exam. Chronic habits which negatively affect her health are discussed and she wants to work at changing these. Immunization is reviewed , and  updated if needed.   PE: BP 124/80   Pulse 66   Resp 18   Ht 5\' 3"  (1.6 m)   Wt (!) 364 lb 1.9 oz (165.2 kg)   LMP  (LMP Unknown)   SpO2 95%   BMI 64.50 kg/m   Pleasant  female, alert and oriented x 3, in no cardio-pulmonary distress. Afebrile. HEENT No facial trauma or asymetry. Sinuses non tender.  Extra occullar muscles intact.. External ears normal, . Neck: supple, no adenopathy,JVD or thyromegaly.No bruits.  Chest: Clear to ascultation bilaterally.No crackles or wheezes. Non tender to palpation  Breast: Not examined  Cardiovascular system; Heart sounds normal,  S1 and  S2 ,no S3.  No murmur, or thrill. Apical beat not displaced Peripheral pulses normal.  Abdomen: Soft, non tender Cervix pink and appears healthy, no lesions or ulcerations noted, no discharge noted from os Uterus normal size, no adnexal masses, no cervical motion or adnexal tenderness.   Musculoskeletal exam: Decreased  ROM of spine, hips ,  and knees. Deformity ,swelling and  crepitus noted. No muscle wasting or atrophy.   Neurologic: Cranial nerves 2 to 12 intact. Power, tone ,sensation normal throughout.  disturbance in gait. No tremor.  Skin: Intact, no ulceration, erythema , scaling or rash noted. Hyper Pigmentation noted in lower  ext over shins Psych; Tearful  mood and sad  affect. Judgement and concentration normal, grieving recent unexpected loss of spouse   Assessment & Plan:  Annual visit for general adult medical examination without abnormal findings Annual exam as documented. Counseling done  re healthy lifestyle involving commitment to 150 minutes  exercise per week, heart healthy diet, and attaining healthy weight.The importance of adequate sleep also discussed. Regular seat belt use and home safety, is also discussed. Changes in health habits are decided on by the patient with goals and time frames  set for achieving them. Immunization and cancer screening needs are specifically addressed at this visit.   Encounter for immunization After obtaining informed consent, the pneumonia 20 vaccine is  administered , with no adverse effect noted at the time of administration.   MORBID OBESITY  Patient re-educated about  the importance of commitment to a  minimum of 150 minutes of exercise per week as able.  The importance of healthy food choices with portion control discussed, as well as eating regularly and within a 12 hour window most days. The need to choose "clean , green" food 50 to 75% of the time is discussed, as well as to make water the primary drink and set a goal of 64 ounces water daily.       05/03/2023    1:03 PM 04/26/2023    8:26 AM 12/26/2022    8:14 AM  Weight /BMI  Weight 364 lb 1.9 oz 360 lb 12.8 oz 364 lb 12.8 oz  Height 5\' 3"  (1.6 m) 5\' 3"  (1.6 m) 5\' 3"  (1.6 m)  BMI 64.5 kg/m2 63.91 kg/m2 64.62 kg/m2

## 2023-05-03 NOTE — Patient Instructions (Addendum)
 Follow-up in 4 months, call if you need me sooner.  Please reschedule mammogram at checkout  Pneumonia 20 vaccine today.  Call [phone] for help with smoking cessation including possibly being able to get the Nicorette gum or NicoDerm patches through this service.  Lipid, cBC, TSH and vit D tolday (Nurse pls order)  Please work on changing habits that are  harmful as we discussed.   Sleep study is definitely recommended please let me know if you decide on getting this done.  My condolence during this difficult time.   Thanks for choosing [facility], we consider it a privelige to serve you.

## 2023-05-03 NOTE — Assessment & Plan Note (Signed)
  Patient re-educated about  the importance of commitment to a  minimum of 150 minutes of exercise per week as able.  The importance of healthy food choices with portion control discussed, as well as eating regularly and within a 12 hour window most days. The need to choose "clean , green" food 50 to 75% of the time is discussed, as well as to make water the primary drink and set a goal of 64 ounces water daily.       05/03/2023    1:03 PM 04/26/2023    8:26 AM 12/26/2022    8:14 AM  Weight /BMI  Weight 364 lb 1.9 oz 360 lb 12.8 oz 364 lb 12.8 oz  Height 5\' 3"  (1.6 m) 5\' 3"  (1.6 m) 5\' 3"  (1.6 m)  BMI 64.5 kg/m2 63.91 kg/m2 64.62 kg/m2

## 2023-05-04 ENCOUNTER — Encounter: Payer: Self-pay | Admitting: Family Medicine

## 2023-05-04 LAB — CBC WITH DIFFERENTIAL/PLATELET
Basophils Absolute: 0 10*3/uL (ref 0.0–0.2)
Basos: 1 %
EOS (ABSOLUTE): 0.2 10*3/uL (ref 0.0–0.4)
Eos: 3 %
Hematocrit: 39.5 % (ref 34.0–46.6)
Hemoglobin: 13 g/dL (ref 11.1–15.9)
Immature Grans (Abs): 0 10*3/uL (ref 0.0–0.1)
Immature Granulocytes: 0 %
Lymphocytes Absolute: 1.6 10*3/uL (ref 0.7–3.1)
Lymphs: 26 %
MCH: 29.6 pg (ref 26.6–33.0)
MCHC: 32.9 g/dL (ref 31.5–35.7)
MCV: 90 fL (ref 79–97)
Monocytes Absolute: 0.6 10*3/uL (ref 0.1–0.9)
Monocytes: 10 %
Neutrophils Absolute: 3.6 10*3/uL (ref 1.4–7.0)
Neutrophils: 60 %
Platelets: 247 10*3/uL (ref 150–450)
RBC: 4.39 x10E6/uL (ref 3.77–5.28)
RDW: 12.8 % (ref 11.7–15.4)
WBC: 6 10*3/uL (ref 3.4–10.8)

## 2023-05-04 LAB — LIPID PANEL
Chol/HDL Ratio: 2 ratio (ref 0.0–4.4)
Cholesterol, Total: 161 mg/dL (ref 100–199)
HDL: 82 mg/dL (ref >39)
LDL Chol Calc (NIH): 62 mg/dL (ref 0–99)
Triglycerides: 93 mg/dL (ref 0–149)
VLDL Cholesterol Cal: 17 mg/dL (ref 5–40)

## 2023-05-04 LAB — VITAMIN D 25 HYDROXY (VIT D DEFICIENCY, FRACTURES): Vit D, 25-Hydroxy: 55.1 ng/mL (ref 30.0–100.0)

## 2023-05-04 LAB — TSH: TSH: 1.09 u[IU]/mL (ref 0.450–4.500)

## 2023-05-08 ENCOUNTER — Telehealth: Payer: Self-pay

## 2023-05-08 ENCOUNTER — Other Ambulatory Visit: Payer: Self-pay

## 2023-05-08 DIAGNOSIS — E1121 Type 2 diabetes mellitus with diabetic nephropathy: Secondary | ICD-10-CM

## 2023-05-08 MED ORDER — TOUJEO MAX SOLOSTAR 300 UNIT/ML ~~LOC~~ SOPN: 70.0000 [IU] | PEN_INJECTOR | Freq: Every day | SUBCUTANEOUS | 0 refills | Status: DC

## 2023-05-08 NOTE — Telephone Encounter (Signed)
 Left a message requesting pt return call to the office.

## 2023-07-25 ENCOUNTER — Other Ambulatory Visit: Payer: Self-pay | Admitting: "Endocrinology

## 2023-07-25 DIAGNOSIS — E1121 Type 2 diabetes mellitus with diabetic nephropathy: Secondary | ICD-10-CM

## 2023-08-04 ENCOUNTER — Other Ambulatory Visit: Payer: Self-pay | Admitting: Family Medicine

## 2023-08-27 ENCOUNTER — Ambulatory Visit: Admitting: "Endocrinology

## 2023-08-28 ENCOUNTER — Ambulatory Visit (INDEPENDENT_AMBULATORY_CARE_PROVIDER_SITE_OTHER): Admitting: "Endocrinology

## 2023-08-28 ENCOUNTER — Encounter: Payer: Self-pay | Admitting: "Endocrinology

## 2023-08-28 VITALS — BP 112/78 | HR 64 | Ht 63.0 in | Wt 341.0 lb

## 2023-08-28 DIAGNOSIS — F17200 Nicotine dependence, unspecified, uncomplicated: Secondary | ICD-10-CM

## 2023-08-28 DIAGNOSIS — Z794 Long term (current) use of insulin: Secondary | ICD-10-CM | POA: Diagnosis not present

## 2023-08-28 DIAGNOSIS — E782 Mixed hyperlipidemia: Secondary | ICD-10-CM

## 2023-08-28 DIAGNOSIS — E559 Vitamin D deficiency, unspecified: Secondary | ICD-10-CM | POA: Diagnosis not present

## 2023-08-28 DIAGNOSIS — E1121 Type 2 diabetes mellitus with diabetic nephropathy: Secondary | ICD-10-CM

## 2023-08-28 DIAGNOSIS — Z7985 Long-term (current) use of injectable non-insulin antidiabetic drugs: Secondary | ICD-10-CM

## 2023-08-28 DIAGNOSIS — I10 Essential (primary) hypertension: Secondary | ICD-10-CM | POA: Diagnosis not present

## 2023-08-28 LAB — POCT GLYCOSYLATED HEMOGLOBIN (HGB A1C): HbA1c, POC (controlled diabetic range): 6.9 % (ref 0.0–7.0)

## 2023-08-28 MED ORDER — TIRZEPATIDE 2.5 MG/0.5ML ~~LOC~~ SOAJ: 2.5000 mg | SUBCUTANEOUS | 0 refills | Status: DC

## 2023-08-28 NOTE — Progress Notes (Signed)
 08/28/2023, 3:29 PM                                Endocrinology follow-up note   Subjective:    Patient ID: Isabella Matthews, female    DOB: Jun 15, 1970.  Isabella Matthews is being seen in follow-up for the management of currently controlled type 2 diabetes, hyperlipidemia, hypertension. PMD: Isabella Matthews BRAVO, MD.   Past Medical History:  Diagnosis Date   Aortic stenosis, mild    Aortic valve disorders    Diabetes mellitus    Diabetes mellitus without complication (HCC)    Phreesia 02/08/2020   Edema    Essential hypertension    Hypertension    Phreesia 02/08/2020   Hypertension associated with type 2 diabetes mellitus (HCC) 03/05/2006   Qualifier: Diagnosis of   By: Isabella Matthews         Morbid obesity (HCC)    Onychomycosis of toenail     Past Surgical History:  Procedure Laterality Date   CESAREAN SECTION     x2    Social History   Socioeconomic History   Marital status: Single    Spouse name: Not on file   Number of children: Not on file   Years of education: Not on file   Highest education level: Not on file  Occupational History   Occupation: CNA - [employer]   Tobacco Use   Smoking status: Every Day    Current packs/day: 0.25    Average packs/day: 11.3 packs/day for 27.4 years (310.4 ttl pk-yrs)    Types: Cigarettes    Start date: 82    Last attempt to quit: 11/23/2014   Smokeless tobacco: Never  Vaping Use   Vaping status: Never Used  Substance and Sexual Activity   Alcohol use: Yes    Alcohol/week: 0.0 standard drinks of alcohol    Comment: occasionally   Drug use: No   Sexual activity: Not on file  Other Topics Concern   Not on file  Social History Narrative   Not on file   Social Drivers of Health   Financial Resource Strain: Not on file  Food Insecurity: Not on file  Transportation Needs: Not on file  Physical Activity: Not on file  Stress: Not on file  Social Connections: Not on file     Family History  Problem Relation Age of Onset   Hypertension Mother    Hypertension Father    Diabetes Father     Outpatient Encounter Medications as of 08/28/2023  Medication Sig   tirzepatide  (MOUNJARO ) 2.5 MG/0.5ML Pen Inject 2.5 mg into the skin once a week.   amLODipine  (NORVASC ) 10 MG tablet TAKE 1 TABLET BY MOUTH ONCE  DAILY   aspirin  EC 81 MG tablet Take 1 tablet (81 mg total) by mouth daily. Swallow whole.   Cholecalciferol (VITAMIN D3) 125 MCG (5000 UT) CAPS Take 1 capsule (5,000 Units total) by mouth daily.   furosemide  (LASIX ) 20 MG tablet Take one tablet by mouth every other day   gabapentin  (NEURONTIN ) 300 MG capsule TAKE 1 CAPSULE BY MOUTH AT  BEDTIME   glucose blood test strip 1 each by Other route 2 (two) times daily. Use as instructed   hydrALAZINE  (APRESOLINE ) 10 MG tablet TAKE 1 TABLET BY MOUTH 3 TIMES  DAILY   losartan  (COZAAR ) 100 MG tablet TAKE 1 TABLET BY MOUTH DAILY   Multiple  Vitamin (MULTIVITAMIN) tablet Take 1 tablet by mouth daily.   rosuvastatin  (CRESTOR ) 20 MG tablet TAKE 1 TABLET BY MOUTH DAILY   TOUJEO  MAX SOLOSTAR 300 UNIT/ML Solostar Pen INJECT 70 UNITS INTO THE SKIN AT BEDTIME   triamcinolone  cream (KENALOG ) 0.1 % Apply 1 Application topically 2 (two) times daily.   [DISCONTINUED] Dulaglutide  (TRULICITY ) 1.5 MG/0.5ML SOAJ Inject 1.5 mg into the skin once a week.   No facility-administered encounter medications on file as of 08/28/2023.    ALLERGIES: Allergies  Allergen Reactions   Penicillins Swelling    Has patient had a PCN reaction causing immediate rash, facial/tongue/throat swelling, SOB or lightheadedness with hypotension: Yes Has patient had a PCN reaction causing severe rash involving mucus membranes or skin necrosis: No Has patient had a PCN reaction that required hospitalization: No Has patient had a PCN reaction occurring within the last 10 years: Yes If all of the above answers are NO, then may proceed with Cephalosporin use. Leg  swelling/redness   Ace Inhibitors Cough   Metformin And Related Nausea Only   Tramadol  Rash    Scant rash on hands  After starting tramadol  for knee pain in Jan 16, 2013    VACCINATION STATUS: Immunization History  Administered Date(s) Administered   Influenza Split 10/27/2013   Influenza Whole 12/29/2005, 09/29/2009   Influenza, Seasonal, Injecte, Preservative Fre 10/24/2022   Influenza,inj,Quad PF,6+ Mos 10/17/2012, 10/24/2016, 10/01/2018, 10/08/2019, 03/03/2021, 10/11/2021   Moderna Sars-Covid-2 Vaccination 01/28/2019, 12/16/2019, 07/22/2020   PNEUMOCOCCAL CONJUGATE-20 05/03/2023   Pneumococcal Polysaccharide-23 01/12/2006, 07/09/2015   Tdap 08/12/2010, 09/08/2020   Zoster Recombinant(Shingrix) 07/23/2019, 10/08/2019    Diabetes She presents for her follow-up diabetic visit. She has type 2 diabetes mellitus. Onset time: She was diagnosed at approximate age of 53 years, she did have an episode of gestational diabetes. Her disease course has been improving. There are no hypoglycemic associated symptoms. Pertinent negatives for hypoglycemia include no confusion, headaches, pallor or seizures. Pertinent negatives for diabetes include no chest pain, no fatigue, no polydipsia, no polyphagia and no polyuria. There are no hypoglycemic complications. Symptoms are improving. Diabetic complications include peripheral neuropathy. Risk factors for coronary artery disease include diabetes mellitus, dyslipidemia, family history, hypertension, obesity and sedentary lifestyle. Current diabetic treatment includes insulin  injections and oral agent (monotherapy). She is compliant with treatment most of the time. Her weight is fluctuating minimally. She is following a generally unhealthy diet. When asked about meal planning, she reported none. She has not had a previous visit with a dietitian. She rarely participates in exercise. Her home blood glucose trend is decreasing steadily. Her breakfast blood glucose  range is generally 110-130 mg/dl. Her bedtime blood glucose range is generally 130-140 mg/dl. Her overall blood glucose range is 130-140 mg/dl. [name] presents with improved glycemic profile to target.  Her point-of-care A1c is 6.9% improving from 7.4%.    She did not document hypoglycemia.   ) An ACE inhibitor/angiotensin II receptor blocker is being taken. Eye exam is current.  Hyperlipidemia This is a chronic problem. The current episode started more than 1 year ago. The problem is controlled. Exacerbating diseases include diabetes and obesity. Pertinent negatives include no chest pain, myalgias or shortness of breath. Current antihyperlipidemic treatment includes statins. Risk factors for coronary artery disease include dyslipidemia, family history, obesity, hypertension, a sedentary lifestyle and diabetes mellitus.  Hypertension This is a chronic problem. The current episode started more than 1 year ago. Pertinent negatives include no chest pain, headaches, palpitations or shortness of breath. Risk  factors for coronary artery disease include diabetes mellitus, dyslipidemia, obesity and sedentary lifestyle. Past treatments include angiotensin blockers.     Review of systems: Limited as above.  Objective:    BP 112/78   Pulse 64   Ht 5' 3 (1.6 m)   Wt (!) 341 lb (154.7 kg)   LMP  (LMP Unknown)   BMI 60.41 kg/m   Wt Readings from Last 3 Encounters:  08/28/23 (!) 341 lb (154.7 kg)  05/03/23 (!) 364 lb 1.9 oz (165.2 kg)  04/26/23 (!) 360 lb 12.8 oz (163.7 kg)     Physical Exam- Limited  Constitutional:  Body mass index is 60.41 kg/m. , not in acute distress, normal state of mind    CMP ( most recent) CMP     Component Value Date/Time   NA 141 04/18/2023 1038   K 4.3 04/18/2023 1038   CL 100 04/18/2023 1038   CO2 23 04/18/2023 1038   GLUCOSE 152 (H) 04/18/2023 1038   GLUCOSE 138 (H) 06/19/2019 0721   BUN 12 04/18/2023 1038   CREATININE 0.80 04/18/2023 1038   CREATININE  0.87 06/19/2019 0721   CALCIUM  9.9 04/18/2023 1038   PROT 7.6 04/18/2023 1038   ALBUMIN 4.4 04/18/2023 1038   AST 27 04/18/2023 1038   ALT 24 04/18/2023 1038   ALKPHOS 88 04/18/2023 1038   BILITOT 0.6 04/18/2023 1038   GFRNONAA 70 02/02/2020 1128   GFRNONAA 101 02/14/2019 0848   GFRAA 81 02/02/2020 1128   GFRAA 117 02/14/2019 0848    Diabetic Labs (most recent): Lab Results  Component Value Date   HGBA1C 6.9 08/28/2023   HGBA1C 7.1 (A) 12/26/2022   HGBA1C 6.8 08/09/2022   MICROALBUR 30 09/01/2021   MICROALBUR 3.0 (H) 07/23/2019   MICROALBUR 33.3 01/27/2017     Lipid Panel ( most recent) Lipid Panel     Component Value Date/Time   CHOL 161 05/03/2023 1406   TRIG 93 05/03/2023 1406   HDL 82 05/03/2023 1406   CHOLHDL 2.0 05/03/2023 1406   CHOLHDL 2.2 07/23/2019 1010   VLDL 22 03/16/2016 0808   LDLCALC 62 05/03/2023 1406   LDLCALC 89 07/23/2019 1010   LABVLDL 17 05/03/2023 1406     Lab Results  Component Value Date   TSH 1.090 05/03/2023   TSH 0.652 10/19/2022   TSH 0.993 01/19/2022   TSH 0.838 09/17/2020   TSH 0.98 06/19/2019   TSH 0.88 02/14/2019   TSH 0.96 01/27/2017   TSH 1.18 11/05/2015   TSH 1.603 07/10/2014   TSH 1.152 10/14/2012   TSH 1.202 10/14/2012   FREET4 1.36 09/17/2020   FREET4 1.2 06/19/2019   FREET4 1.2 02/14/2019     Assessment & Plan:   1. Type 2 diabetes mellitus with diabetic nephropathy, with long-term current use of insulin  (HCC)   - MARCHELLE RINELLA has currently uncontrolled symptomatic type 2 DM since  53 years of age.  Almeta presents with improved glycemic profile to target.  Her point-of-care A1c is 6.9% improving from 7.4%.    She did not document hypoglycemia.    -Her recent labs were reviewed with her. - I had a long discussion with her about the progressive nature of diabetes and the pathology behind its complications. -her diabetes is complicated by neuropathy, obesity/sedentary life and she remains at a high risk for  more acute and chronic complications which include CAD, CVA, CKD, retinopathy, and neuropathy. These are all discussed in detail with her.  - I have counseled  her on diet  and weight management  by adopting a carbohydrate restricted/protein rich diet. Patient is encouraged to switch to  unprocessed or minimally processed     complex starch and increased protein intake (animal or plant source), fruits, and vegetables. -Admittedly, she has not engaged optimally to lifestyle medicine, however, she wishes to try again.  - she acknowledges that there is a room for improvement in her food and drink choices. - Suggestion is made for her to avoid simple carbohydrates  from her diet including Cakes, Sweet Desserts, Ice Cream, Soda (diet and regular), Sweet Tea, Candies, Chips, Cookies, Store Bought Juices, Alcohol , Artificial Sweeteners,  Coffee Creamer, and Sugar-free Products, Lemonade. This will help patient to have more stable blood glucose profile and potentially avoid unintended weight gain.  The following Lifestyle Medicine recommendations according to American College of Lifestyle Medicine  [facility]) were discussed and and offered to patient and she  agrees to start the journey:  A. Whole Foods, Plant-Based Nutrition comprising of fruits and vegetables, plant-based proteins, whole-grain carbohydrates was discussed in detail with the patient.   A list for source of those nutrients were also provided to the patient.  Patient will use only water or unsweetened tea for hydration. B.  The need to stay away from risky substances including alcohol, smoking; obtaining 7 to 9 hours of restorative sleep, at least 150 minutes of moderate intensity exercise weekly, the importance of healthy social connections,  and stress management techniques were discussed. C.  A full color page of  Calorie density of various food groups per pound showing examples of each food groups was provided to the patient.   - she has been  scheduled with Penny Crumpton, RDN, CDE for diabetes education.  - I have approached her with the following individualized plan to manage  her diabetes and patient agrees:   In light of her presentation with near target glycemic profile, she would not need prandial insulin  treatment for now.  However, this patient will continue to benefit from GLP-1 receptor agonists.  After finishing her current supplies of Trulicity , she will be switched to Mounjaro  2.5 mg subcutaneously weekly.  This medication will be advanced as she tolerates.  - If her insurance does not provide coverage for Mounjaro , she will be considered for a higher dose of Trulicity  at 3 mg subcutaneously weekly.    - She is advised to continue Tresiba  70 units nightly  associated with monitoring of blood glucose twice a day-daily before breakfast and at bedtime.   - she is warned not to take insulin  without proper monitoring per orders. - she is encouraged to call clinic for blood glucose levels less than 70 or above 200 mg /dl.  -She reports GI intolerance from metformin.     - Specific targets for  A1c;  LDL, HDL, Triglycerides, and  Waist Circumference were discussed with the patient.  2) Blood Pressure /Hypertension:  -Her blood pressure is controlled to target.  She is advised to continue losartan  100 mg p.o. daily, amlodipine  5 mg p.o. daily at breakfast.  She also has hydralazine  10 mg p.o. 3 times daily.  The above detailed lifestyle nutrition will help with hypertension and hyperlipidemia.  3) Lipids/Hyperlipidemia: She presents with controlled lipid panel with controlled LDL at 62.  She is advised to continue Crestor  20 mg p.o. nightly.   Whole food plant-based diet will help with dyslipidemia as well, however her engagement is suboptimal.    4)  Weight/Diet: Her  BMI is 60.41, slowly dropping from 63.91---- clearly complicating her diabetes care.   she is  a candidate for weight loss. I discussed with her the fact that  loss of 5 - 10% of her  current body weight will have the most impact on her diabetes management.  Exercise, and detailed carbohydrates information provided  -  detailed on discharge instructions.  She is a perfect candidate for whole food plant-based diet described above.  She is also a good candidate for bariatric surgery, however she hesitates to consider this option at this time.    5) vitamin D  deficiency She is on ongoing supplement with vitamin D3 5000 units daily.  She is now vitamin D  replete at 46.  She is advised to continue.  6) Chronic Care/Health Maintenance:  -she  is on ACEI/ARB and Statin medications and  is encouraged to initiate and continue to follow up with Ophthalmology, Dentist,  Podiatrist at least yearly or according to recommendations, and advised to   stay away from smoking. I have recommended yearly flu vaccine and pneumonia vaccine at least every 5 years; moderate intensity exercise for up to 150 minutes weekly; and  sleep for at least 7 hours a day.   The patient was counseled on the dangers of tobacco use, and was advised to quit.  Reviewed strategies to maximize success, including removing cigarettes and smoking materials from environment.  - she is  advised to maintain close follow up with Isabella Matthews BRAVO, MD for primary care needs, as well as her other providers for optimal and coordinated care.   I spent  41  minutes in the care of the patient today including review of labs from CMP, Lipids, Thyroid Function, Hematology (current and previous including abstractions from other facilities); face-to-face time discussing  her blood glucose readings/logs, discussing hypoglycemia and hyperglycemia episodes and symptoms, medications doses, her options of short and long term treatment based on the latest standards of care / guidelines;  discussion about incorporating lifestyle medicine;  and documenting the encounter. Risk reduction counseling performed per USPSTF  guidelines to reduce  obesity and cardiovascular risk factors.     Please refer to Patient Instructions for Blood Glucose Monitoring and Insulin /Medications Dosing Guide  in media tab for additional information. Please  also refer to  Patient Self Inventory in the Media  tab for reviewed elements of pertinent patient history.  Rock JONETTA Dais participated in the discussions, expressed understanding, and voiced agreement with the above plans.  All questions were answered to her satisfaction. she is encouraged to contact clinic should she have any questions or concerns prior to her return visit.   Follow up plan: - Return in about 4 months (around 12/28/2023) for F/U with Pre-visit Labs, Meter/CGM/Logs, A1c here.  Ranny Earl, MD [facility] [address] [place], [place] [address] Phone: [phone]  Fax: [phone]    08/28/2023, 3:29 PM  This note was partially dictated with voice recognition software. Similar sounding words can be transcribed inadequately or may not  be corrected upon review.

## 2023-08-28 NOTE — Patient Instructions (Signed)

## 2023-09-04 ENCOUNTER — Encounter: Payer: Self-pay | Admitting: Family Medicine

## 2023-09-04 ENCOUNTER — Ambulatory Visit: Admitting: Family Medicine

## 2023-09-04 VITALS — BP 124/80 | HR 94 | Resp 18 | Ht 63.0 in | Wt 342.1 lb

## 2023-09-04 DIAGNOSIS — E559 Vitamin D deficiency, unspecified: Secondary | ICD-10-CM | POA: Diagnosis not present

## 2023-09-04 DIAGNOSIS — E785 Hyperlipidemia, unspecified: Secondary | ICD-10-CM

## 2023-09-04 DIAGNOSIS — I10 Essential (primary) hypertension: Secondary | ICD-10-CM

## 2023-09-04 DIAGNOSIS — F17200 Nicotine dependence, unspecified, uncomplicated: Secondary | ICD-10-CM

## 2023-09-04 DIAGNOSIS — F419 Anxiety disorder, unspecified: Secondary | ICD-10-CM | POA: Insufficient documentation

## 2023-09-04 DIAGNOSIS — Z794 Long term (current) use of insulin: Secondary | ICD-10-CM

## 2023-09-04 DIAGNOSIS — E1169 Type 2 diabetes mellitus with other specified complication: Secondary | ICD-10-CM | POA: Diagnosis not present

## 2023-09-04 DIAGNOSIS — R053 Chronic cough: Secondary | ICD-10-CM | POA: Insufficient documentation

## 2023-09-04 MED ORDER — BUSPIRONE HCL 7.5 MG PO TABS: 7.5000 mg | ORAL_TABLET | Freq: Two times a day (BID) | ORAL | 1 refills | Status: AC

## 2023-09-04 NOTE — Assessment & Plan Note (Signed)
 Asked:confirms currently smokes cigarettes on avg 1 to 3 per day Assess: Unwilling to set a quit date,  Advise: needs to QUIT to reduce risk of cancer, cardio and cerebrovascular disease Assist: counseled for 5 minutes and literature provided Arrange: follow up in 2 to 4 months

## 2023-09-04 NOTE — Assessment & Plan Note (Signed)
 Updated lab needed at/ before next visit.

## 2023-09-04 NOTE — Assessment & Plan Note (Signed)
 Has had pneumonia needing rept cxr and with chronic cough CXR today

## 2023-09-04 NOTE — Assessment & Plan Note (Addendum)
 Diabetes associated with hypertension, hyperlipidemia, obesity, and arthritis  Isabella Matthews is reminded of the importance of commitment to daily physical activity for 30 minutes or more, as able and the need to limit carbohydrate intake to 30 to 60 grams per meal to help with blood sugar control.   The need to take medication as prescribed, test blood sugar as directed, and to call between visits if there is a concern that blood sugar is uncontrolled is also discussed.   Isabella Matthews is reminded of the importance of daily foot exam, annual eye examination, and good blood sugar, blood pressure and cholesterol control.     Latest Ref Rng & Units 08/28/2023    9:07 AM 05/03/2023    2:06 PM 04/18/2023   10:38 AM 12/26/2022    8:27 AM 10/19/2022    3:15 PM  Diabetic Labs  HbA1c 0.0 - 7.0 % 6.9    7.1    Micro/Creat Ratio 0 - 29 mg/g creat     18   Chol 100 - 199 mg/dL  838      HDL >60 mg/dL  82      Calc LDL 0 - 99 mg/dL  62      Triglycerides 0 - 149 mg/dL  93      Creatinine 9.42 - 1.00 mg/dL   9.19   9.27       1/87/7974    8:53 AM 09/04/2023    8:21 AM 08/28/2023    8:40 AM 05/03/2023    1:46 PM 05/03/2023    1:03 PM 04/26/2023    8:26 AM 12/26/2022    8:14 AM  BP/Weight  Systolic BP 124 158 112 124 146 128 122  Diastolic BP 80 82 78 80 76 74 80  Wt. (Lbs)  342.12 341  364.12 360.8 364.8  BMI  60.6 kg/m2 60.41 kg/m2  64.5 kg/m2 63.91 kg/m2 64.62 kg/m2      10/24/2022    8:40 AM 10/11/2021    4:00 PM  Foot/eye exam completion dates  Foot Form Completion Done Done   Improved, managed by Endo

## 2023-09-04 NOTE — Assessment & Plan Note (Signed)
 Start buspar  7.5 mg twice daily

## 2023-09-04 NOTE — Progress Notes (Signed)
 Isabella Matthews     MRN: [id number]      DOB: [birth year]  Chief Complaint  Patient presents with   Medical Management of Chronic Issues    4 month follow up     HPI Isabella Matthews is here for follow up and re-evaluation of chronic medical conditions, medication management and review of any available recent lab and radiology data.  Preventive health is updated, specifically  Cancer screening and Immunization.   Questions or concerns regarding consultations or procedures which the PT has had in the interim are  addressed. The PT denies any adverse reactions to current medications since the last visit.  There are no new concerns.  There are no specific complaints   ROS Denies recent fever or chills. Denies sinus pressure, nasal congestion, ear pain or sore throat. Denies chest congestion, productive cough or wheezing. Denies chest pains, palpitations and leg swelling Denies abdominal pain, nausea, vomiting,diarrhea or constipation.   Denies dysuria, frequency, hesitancy or incontinence. Denies  uncontrolled joint pain, swelling and limitation in mobility. Denies headaches, seizures, numbness, or tingling. Denies depression, anxiety or insomnia. Denies skin break down or rash.   PE  BP 124/80   Pulse 94   Resp 18   Ht 5' 3 (1.6 m)   Wt (!) 342 lb 1.9 oz (155.2 kg)   LMP  (LMP Unknown)   SpO2 93%   BMI 60.60 kg/m   Patient alert and oriented and in no cardiopulmonary distress.  HEENT: No facial asymmetry, EOMI,     Neck supple .  Chest: Clear to auscultation bilaterally.  CVS: S1, S2 no murmurs, no S3.Regular rate.  ABD: Soft non tender.   Ext: No edema  MS: Adequate ROM spine, shoulders, hips and knees.  Skin: Intact, no ulcerations or rash noted.  Psych: Good eye contact, normal affect. Memory intact not anxious or depressed appearing.  CNS: CN 2-12 intact, power,  normal throughout.no focal deficits noted.   Assessment & Plan  Hyperlipidemia LDL goal  <100 Hyperlipidemia:Low fat diet discussed and encouraged.   Lipid Panel  Lab Results  Component Value Date   CHOL 161 05/03/2023   HDL 82 05/03/2023   LDLCALC 62 05/03/2023   TRIG 93 05/03/2023   CHOLHDL 2.0 05/03/2023     Updated lab needed at/ before next visit.   Essential hypertension, benign Controlled, no change in medication DASH diet and commitment to daily physical activity for a minimum of 30 minutes discussed and encouraged, as a part of hypertension management. The importance of attaining a healthy weight is also discussed.     09/04/2023    8:53 AM 09/04/2023    8:21 AM 08/28/2023    8:40 AM 05/03/2023    1:46 PM 05/03/2023    1:03 PM 04/26/2023    8:26 AM 12/26/2022    8:14 AM  BP/Weight  Systolic BP 124 158 112 124 146 128 122  Diastolic BP 80 82 78 80 76 74 80  Wt. (Lbs)  342.12 341  364.12 360.8 364.8  BMI  60.6 kg/m2 60.41 kg/m2  64.5 kg/m2 63.91 kg/m2 64.62 kg/m2       MORBID OBESITY  Patient re-educated about  the importance of commitment to a  minimum of 150 minutes of exercise per week as able.  The importance of healthy food choices with portion control discussed, as well as eating regularly and within a 12 hour window most days. The need to choose clean , green food 50 to 75%  of the time is discussed, as well as to make water the primary drink and set a goal of 64 ounces water daily.       09/04/2023    8:21 AM 08/28/2023    8:40 AM 05/03/2023    1:03 PM  Weight /BMI  Weight 342 lb 1.9 oz 341 lb 364 lb 1.9 oz  Height 5' 3 (1.6 m) 5' 3 (1.6 m) 5' 3 (1.6 m)  BMI 60.6 kg/m2 60.41 kg/m2 64.5 kg/m2      Vitamin D  deficiency Updated lab needed at/ before next visit.   Current smoker Asked:confirms currently smokes cigarettes on avg 1 to 3 per day Assess: Unwilling to set a quit date,  Advise: needs to QUIT to reduce risk of cancer, cardio and cerebrovascular disease Assist: counseled for 5 minutes and literature provided Arrange:  follow up in 2 to 4 months   Type 2 diabetes mellitus with other specified complication (HCC) Diabetes associated with hypertension, hyperlipidemia, obesity, and arthritis  Isabella Matthews is reminded of the importance of commitment to daily physical activity for 30 minutes or more, as able and the need to limit carbohydrate intake to 30 to 60 grams per meal to help with blood sugar control.   The need to take medication as prescribed, test blood sugar as directed, and to call between visits if there is a concern that blood sugar is uncontrolled is also discussed.   Isabella Matthews is reminded of the importance of daily foot exam, annual eye examination, and good blood sugar, blood pressure and cholesterol control.     Latest Ref Rng & Units 08/28/2023    9:07 AM 05/03/2023    2:06 PM 04/18/2023   10:38 AM 12/26/2022    8:27 AM 10/19/2022    3:15 PM  Diabetic Labs  HbA1c 0.0 - 7.0 % 6.9    7.1    Micro/Creat Ratio 0 - 29 mg/g creat     18   Chol 100 - 199 mg/dL  838      HDL >60 mg/dL  82      Calc LDL 0 - 99 mg/dL  62      Triglycerides 0 - 149 mg/dL  93      Creatinine 9.42 - 1.00 mg/dL   9.19   9.27       1/87/7974    8:53 AM 09/04/2023    8:21 AM 08/28/2023    8:40 AM 05/03/2023    1:46 PM 05/03/2023    1:03 PM 04/26/2023    8:26 AM 12/26/2022    8:14 AM  BP/Weight  Systolic BP 124 158 112 124 146 128 122  Diastolic BP 80 82 78 80 76 74 80  Wt. (Lbs)  342.12 341  364.12 360.8 364.8  BMI  60.6 kg/m2 60.41 kg/m2  64.5 kg/m2 63.91 kg/m2 64.62 kg/m2      10/24/2022    8:40 AM 10/11/2021    4:00 PM  Foot/eye exam completion dates  Foot Form Completion Done Done   Improved, managed by Endo     Chronic cough Has had pneumonia needing rept cxr and with chronic cough CXR today  Anxiety Start buspar  7.5 mg twice daily

## 2023-09-04 NOTE — Assessment & Plan Note (Signed)
 Controlled, no change in medication DASH diet and commitment to daily physical activity for a minimum of 30 minutes discussed and encouraged, as a part of hypertension management. The importance of attaining a healthy weight is also discussed.     09/04/2023    8:53 AM 09/04/2023    8:21 AM 08/28/2023    8:40 AM 05/03/2023    1:46 PM 05/03/2023    1:03 PM 04/26/2023    8:26 AM 12/26/2022    8:14 AM  BP/Weight  Systolic BP 124 158 112 124 146 128 122  Diastolic BP 80 82 78 80 76 74 80  Wt. (Lbs)  342.12 341  364.12 360.8 364.8  BMI  60.6 kg/m2 60.41 kg/m2  64.5 kg/m2 63.91 kg/m2 64.62 kg/m2

## 2023-09-04 NOTE — Assessment & Plan Note (Signed)
 Hyperlipidemia:Low fat diet discussed and encouraged.   Lipid Panel  Lab Results  Component Value Date   CHOL 161 05/03/2023   HDL 82 05/03/2023   LDLCALC 62 05/03/2023   TRIG 93 05/03/2023   CHOLHDL 2.0 05/03/2023     Updated lab needed at/ before next visit.

## 2023-09-04 NOTE — Assessment & Plan Note (Signed)
  Patient re-educated about  the importance of commitment to a  minimum of 150 minutes of exercise per week as able.  The importance of healthy food choices with portion control discussed, as well as eating regularly and within a 12 hour window most days. The need to choose clean , green food 50 to 75% of the time is discussed, as well as to make water the primary drink and set a goal of 64 ounces water daily.       09/04/2023    8:21 AM 08/28/2023    8:40 AM 05/03/2023    1:03 PM  Weight /BMI  Weight 342 lb 1.9 oz 341 lb 364 lb 1.9 oz  Height 5' 3 (1.6 m) 5' 3 (1.6 m) 5' 3 (1.6 m)  BMI 60.6 kg/m2 60.41 kg/m2 64.5 kg/m2

## 2023-09-04 NOTE — Patient Instructions (Addendum)
 Follow-up in mid November  Fasting lipid, cmp and EGFr, microalb, CBC, TSH and vit D 3 to 5 days before Nov appt   CXR for conic cough today, nurse please enter  New for Anxiety is BuSpar  7.5 mg take 1 tablet twice daily.  Please set a quit date for smoking you now  smoke on average 1 cigarette/day and you do need to quit.  Nurse will arrange for hepatitis B vaccine to be administered at the time of checkout.  Please schedule your mammogram this is past due.  Please schedule your eye exam this is past due.  Congratulations on marked improvement in overall health with excellent blood pressure blood sugar control and excellent lipid and hepatic panel when done earlier this year.also excellent weight loss, please keep this up  It is important that you exercise regularly at least 30 minutes 5 times a week. If you develop chest pain, have severe difficulty breathing, or feel very tired, stop exercising immediately and seek medical attention   Thanks for choosing [facility], we consider it a privelige to serve you.

## 2023-09-18 ENCOUNTER — Ambulatory Visit

## 2023-12-05 ENCOUNTER — Ambulatory Visit: Payer: Self-pay | Admitting: Family Medicine

## 2023-12-05 LAB — CBC WITH DIFFERENTIAL/PLATELET
Basophils Absolute: 0.1 x10E3/uL (ref 0.0–0.2)
Basos: 1 %
EOS (ABSOLUTE): 0.2 x10E3/uL (ref 0.0–0.4)
Eos: 2 %
Hematocrit: 39.5 % (ref 34.0–46.6)
Hemoglobin: 13 g/dL (ref 11.1–15.9)
Immature Grans (Abs): 0 x10E3/uL (ref 0.0–0.1)
Immature Granulocytes: 0 %
Lymphocytes Absolute: 2.2 x10E3/uL (ref 0.7–3.1)
Lymphs: 30 %
MCH: 30.4 pg (ref 26.6–33.0)
MCHC: 32.9 g/dL (ref 31.5–35.7)
MCV: 92 fL (ref 79–97)
Monocytes Absolute: 1.1 x10E3/uL — ABNORMAL HIGH (ref 0.1–0.9)
Monocytes: 14 %
Neutrophils Absolute: 3.8 x10E3/uL (ref 1.4–7.0)
Neutrophils: 53 %
Platelets: 284 x10E3/uL (ref 150–450)
RBC: 4.28 x10E6/uL (ref 3.77–5.28)
RDW: 13.1 % (ref 11.7–15.4)
WBC: 7.3 x10E3/uL (ref 3.4–10.8)

## 2023-12-05 LAB — VITAMIN D 25 HYDROXY (VIT D DEFICIENCY, FRACTURES): Vit D, 25-Hydroxy: 54.1 ng/mL (ref 30.0–100.0)

## 2023-12-05 LAB — TSH: TSH: 1.23 u[IU]/mL (ref 0.450–4.500)

## 2023-12-05 LAB — CMP14+EGFR
ALT: 21 IU/L (ref 0–32)
AST: 27 IU/L (ref 0–40)
Albumin: 4.6 g/dL (ref 3.8–4.9)
Alkaline Phosphatase: 93 IU/L (ref 49–135)
BUN/Creatinine Ratio: 20 (ref 9–23)
BUN: 15 mg/dL (ref 6–24)
Bilirubin Total: 0.8 mg/dL (ref 0.0–1.2)
CO2: 23 mmol/L (ref 20–29)
Calcium: 10.5 mg/dL — ABNORMAL HIGH (ref 8.7–10.2)
Chloride: 102 mmol/L (ref 96–106)
Creatinine, Ser: 0.76 mg/dL (ref 0.57–1.00)
Globulin, Total: 3.1 g/dL (ref 1.5–4.5)
Glucose: 100 mg/dL — ABNORMAL HIGH (ref 70–99)
Potassium: 4.3 mmol/L (ref 3.5–5.2)
Sodium: 142 mmol/L (ref 134–144)
Total Protein: 7.7 g/dL (ref 6.0–8.5)
eGFR: 93 mL/min/1.73 (ref >59)

## 2023-12-05 LAB — MICROALBUMIN / CREATININE URINE RATIO
Creatinine, Urine: 137.6 mg/dL
Microalb/Creat Ratio: 25 mg/g{creat} (ref 0–29)
Microalbumin, Urine: 34.9 ug/mL

## 2023-12-05 LAB — LIPID PANEL
Chol/HDL Ratio: 1.9 ratio (ref 0.0–4.4)
Cholesterol, Total: 183 mg/dL (ref 100–199)
HDL: 94 mg/dL (ref >39)
LDL Chol Calc (NIH): 73 mg/dL (ref 0–99)
Triglycerides: 93 mg/dL (ref 0–149)
VLDL Cholesterol Cal: 16 mg/dL (ref 5–40)

## 2023-12-13 ENCOUNTER — Encounter: Payer: Self-pay | Admitting: Family Medicine

## 2023-12-13 ENCOUNTER — Ambulatory Visit: Admitting: Family Medicine

## 2023-12-13 VITALS — BP 139/84 | HR 92 | Resp 16 | Ht 63.0 in | Wt 338.1 lb

## 2023-12-13 DIAGNOSIS — F17200 Nicotine dependence, unspecified, uncomplicated: Secondary | ICD-10-CM

## 2023-12-13 DIAGNOSIS — E1169 Type 2 diabetes mellitus with other specified complication: Secondary | ICD-10-CM | POA: Diagnosis not present

## 2023-12-13 DIAGNOSIS — Z794 Long term (current) use of insulin: Secondary | ICD-10-CM

## 2023-12-13 DIAGNOSIS — B369 Superficial mycosis, unspecified: Secondary | ICD-10-CM

## 2023-12-13 DIAGNOSIS — Z6841 Body Mass Index (BMI) 40.0 and over, adult: Secondary | ICD-10-CM

## 2023-12-13 DIAGNOSIS — Z23 Encounter for immunization: Secondary | ICD-10-CM | POA: Diagnosis not present

## 2023-12-13 DIAGNOSIS — I10 Essential (primary) hypertension: Secondary | ICD-10-CM | POA: Diagnosis not present

## 2023-12-13 DIAGNOSIS — E785 Hyperlipidemia, unspecified: Secondary | ICD-10-CM

## 2023-12-13 NOTE — Patient Instructions (Addendum)
 Annual exam end April  Fasting lipid, cmp and EGFr 1 week before April appointment  Please schedule mammogram at checkout , afternoon appt after 3:30 pm requested  Work on stopping smoking and also drinking   Hep B #2 today  I recommend covid vaccine , you get this at your pharmacy  Pls schedule and get your eye exam I have referred you  It is important that you exercise regularly at least 30 minutes 5 times a week. If you develop chest pain, have severe difficulty breathing, or feel very tired, stop exercising immediately and seek medical attention   Think about what you will eat, plan ahead. Choose  clean, green, fresh or frozen over canned, processed or packaged foods which are more sugary, salty and fatty. 70 to 75% of food eaten should be vegetables and fruit. Three meals at set times with snacks allowed between meals, but they must be fruit or vegetables. Aim to eat over a 12 hour period , example 7 am to 7 pm, and STOP after  your last meal of the day. Drink water,generally about 64 ounces per day, no other drink is as healthy. Fruit juice is best enjoyed in a healthy way, by EATING the fruit.  Thanks for choosing [facility], we consider it a privelige to serve you.

## 2023-12-13 NOTE — Progress Notes (Signed)
 Isabella Matthews     MRN: [id number]      DOB: Nov 28, 1970  Chief Complaint  Patient presents with   Hypertension    Follow up     HPI Isabella Matthews is here for follow up and re-evaluation of chronic medical conditions, medication management and review of any available recent lab and radiology data.  Preventive health is updated, specifically  Cancer screening and Immunization.   Questions or concerns regarding consultations or procedures which the PT has had in the interim are  addressed. The PT denies any adverse reactions to current medications since the last visit.  There are no new concerns.  There are no specific complaints   ROS Denies recent fever or chills. Denies sinus pressure, nasal congestion, ear pain or sore throat. Denies chest congestion, productive cough or wheezing. Denies chest pains, palpitations and leg swelling Denies abdominal pain, nausea, vomiting,diarrhea or constipation.   Denies dysuria, frequency, hesitancy or incontinence. Denies joint pain, swelling and limitation in mobility. Denies headaches, seizures, numbness, or tingling. Denies depression, anxiety or insomnia. Denies skin break down or rash.   PE  BP 139/84   Pulse 92   Resp 16   Ht 5' 3 (1.6 m)   Wt (!) 338 lb 1.3 oz (153.4 kg)   LMP  (LMP Unknown)   SpO2 97%   BMI 59.89 kg/m   Patient alert and oriented and in no cardiopulmonary distress.  HEENT: No facial asymmetry, EOMI,     Neck supple .  Chest: Clear to auscultation bilaterally.  CVS: S1, S2 no murmurs, no S3.Regular rate.  ABD: Soft non tender.   Ext: No edema  MS: Adequate ROM spine, shoulders, hips and knees.  Skin: Intact, no ulcerations or rash noted.  Psych: Good eye contact, normal affect. Memory intact not anxious or depressed appearing.  CNS: CN 2-12 intact, power,  normal throughout.no focal deficits noted.   Assessment & Plan  Type 2 diabetes mellitus with other specified complication  (HCC) Diabetes associated with hypertension, hyperlipidemia, and obesity  Isabella Matthews is reminded of the importance of commitment to daily physical activity for 30 minutes or more, as able and the need to limit carbohydrate intake to 30 to 60 grams per meal to help with blood sugar control.   The need to take medication as prescribed, test blood sugar as directed, and to call between visits if there is a concern that blood sugar is uncontrolled is also discussed.   Isabella Matthews is reminded of the importance of daily foot exam, annual eye examination, and good blood sugar, blood pressure and cholesterol control.     Latest Ref Rng & Units 12/03/2023    3:43 PM 08/28/2023    9:07 AM 05/03/2023    2:06 PM 04/18/2023   10:38 AM 12/26/2022    8:27 AM  Diabetic Labs  HbA1c 0.0 - 7.0 %  6.9    7.1   Micro/Creat Ratio 0 - 29 mg/g creat 25       Chol 100 - 199 mg/dL 816   838     HDL >60 mg/dL 94   82     Calc LDL 0 - 99 mg/dL 73   62     Triglycerides 0 - 149 mg/dL 93   93     Creatinine 0.57 - 1.00 mg/dL 9.23    9.19        88/79/7974    8:36 AM 09/04/2023    8:53 AM 09/04/2023  8:21 AM 08/28/2023    8:40 AM 05/03/2023    1:46 PM 05/03/2023    1:03 PM 04/26/2023    8:26 AM  BP/Weight  Systolic BP 139 124 158 112 124 146 128  Diastolic BP 84 80 82 78 80 76 74  Wt. 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   Essential hypertension, benign Controlled, no change in medication DASH diet and commitment to daily physical activity for a minimum of 30 minutes discussed and encouraged, as a part of hypertension management. The importance of attaining a healthy weight is also discussed.     12/13/2023    8:36 AM 09/04/2023    8:53 AM 09/04/2023    8:21 AM 08/28/2023    8:40 AM 05/03/2023    1:46 PM 05/03/2023    1:03 PM 04/26/2023    8:26 AM  BP/Weight  Systolic BP 139 124 158 112 124 146 128  Diastolic BP 84 80 82 78 80 76 74  Wt. (Lbs) 338.08  342.12 341  364.12 360.8  BMI 59.89 kg/m2  60.6 kg/m2 60.41 kg/m2  64.5 kg/m2 63.91 kg/m2       Hyperlipidemia LDL goal <100 Hyperlipidemia:Low fat diet discussed and encouraged.   Lipid Panel  Lab Results  Component Value Date   CHOL 183 12/03/2023   HDL 94 12/03/2023   LDLCALC 73 12/03/2023   TRIG 93 12/03/2023   CHOLHDL 1.9 12/03/2023     Controlled, no change in medication   Morbid obesity with body mass index (BMI) of 50.0 to 59.9 in adult [facility])  Patient re-educated about  the importance of commitment to a  minimum of 150 minutes of exercise per week as able.  The importance of healthy food choices with portion control discussed, as well as eating regularly and within a 12 hour window most days. The need to choose clean , green food 50 to 75% of the time is discussed, as well as to make water the primary drink and set a goal of 64 ounces water daily.       12/13/2023    8:36 AM 09/04/2023    8:21 AM 08/28/2023    8:40 AM  Weight /BMI  Weight 338 lb 1.3 oz 342 lb 1.9 oz 341 lb  Height 5' 3 (1.6 m) 5' 3 (1.6 m) 5' 3 (1.6 m)  BMI 59.89 kg/m2 60.6 kg/m2 60.41 kg/m2    Improved, congratulated on this

## 2023-12-13 NOTE — Assessment & Plan Note (Signed)
 Diabetes associated with hypertension, hyperlipidemia, and obesity  Isabella Matthews is reminded of the importance of commitment to daily physical activity for 30 minutes or more, as able and the need to limit carbohydrate intake to 30 to 60 grams per meal to help with blood sugar control.   The need to take medication as prescribed, test blood sugar as directed, and to call between visits if there is a concern that blood sugar is uncontrolled is also discussed.   Isabella Matthews is reminded of the importance of daily foot exam, annual eye examination, and good blood sugar, blood pressure and cholesterol control.     Latest Ref Rng & Units 12/03/2023    3:43 PM 08/28/2023    9:07 AM 05/03/2023    2:06 PM 04/18/2023   10:38 AM 12/26/2022    8:27 AM  Diabetic Labs  HbA1c 0.0 - 7.0 %  6.9    7.1   Micro/Creat Ratio 0 - 29 mg/g creat 25       Chol 100 - 199 mg/dL 816   838     HDL >60 mg/dL 94   82     Calc LDL 0 - 99 mg/dL 73   62     Triglycerides 0 - 149 mg/dL 93   93     Creatinine 0.57 - 1.00 mg/dL 9.23    9.19        88/79/7974    8:36 AM 09/04/2023    8:53 AM 09/04/2023    8:21 AM 08/28/2023    8:40 AM 05/03/2023    1:46 PM 05/03/2023    1:03 PM 04/26/2023    8:26 AM  BP/Weight  Systolic BP 139 124 158 112 124 146 128  Diastolic BP 84 80 82 78 80 76 74  Wt. (Lbs) 338.08  342.12 341  364.12 360.8  BMI 59.89 kg/m2  60.6 kg/m2 60.41 kg/m2  64.5 kg/m2 63.91 kg/m2      12/13/2023    8:40 AM 10/24/2022    8:40 AM  Foot/eye exam completion dates  Foot Form Completion Done Done

## 2023-12-14 DIAGNOSIS — Z23 Encounter for immunization: Secondary | ICD-10-CM | POA: Insufficient documentation

## 2023-12-14 NOTE — Assessment & Plan Note (Signed)
  Patient re-educated about  the importance of commitment to a  minimum of 150 minutes of exercise per week as able.  The importance of healthy food choices with portion control discussed, as well as eating regularly and within a 12 hour window most days. The need to choose clean , green food 50 to 75% of the time is discussed, as well as to make water the primary drink and set a goal of 64 ounces water daily.       12/13/2023    8:36 AM 09/04/2023    8:21 AM 08/28/2023    8:40 AM  Weight /BMI  Weight 338 lb 1.3 oz 342 lb 1.9 oz 341 lb  Height 5' 3 (1.6 m) 5' 3 (1.6 m) 5' 3 (1.6 m)  BMI 59.89 kg/m2 60.6 kg/m2 60.41 kg/m2    Improved, congratulated on this

## 2023-12-14 NOTE — Assessment & Plan Note (Signed)
 After obtaining informed consent, the Hep B#2  vaccine is  administered , with no adverse effect noted at the time of administration.

## 2023-12-14 NOTE — Assessment & Plan Note (Signed)
 Asked:confirms currently smokes cigarettes Assess: Unwilling to set a quit date,considering cutting back Advise: needs to QUIT to reduce risk of cancer, cardio and cerebrovascular disease Assist: counseled for 5 minutes and literature provided Arrange: follow up in 2 to 4 months

## 2023-12-14 NOTE — Assessment & Plan Note (Signed)
 Controlled, no change in medication DASH diet and commitment to daily physical activity for a minimum of 30 minutes discussed and encouraged, as a part of hypertension management. The importance of attaining a healthy weight is also discussed.     12/13/2023    8:36 AM 09/04/2023    8:53 AM 09/04/2023    8:21 AM 08/28/2023    8:40 AM 05/03/2023    1:46 PM 05/03/2023    1:03 PM 04/26/2023    8:26 AM  BP/Weight  Systolic BP 139 124 158 112 124 146 128  Diastolic BP 84 80 82 78 80 76 74  Wt. (Lbs) 338.08  342.12 341  364.12 360.8  BMI 59.89 kg/m2  60.6 kg/m2 60.41 kg/m2  64.5 kg/m2 63.91 kg/m2

## 2023-12-14 NOTE — Assessment & Plan Note (Signed)
 Hyperlipidemia:Low fat diet discussed and encouraged.   Lipid Panel  Lab Results  Component Value Date   CHOL 183 12/03/2023   HDL 94 12/03/2023   LDLCALC 73 12/03/2023   TRIG 93 12/03/2023   CHOLHDL 1.9 12/03/2023     Controlled, no change in medication

## 2023-12-14 NOTE — Assessment & Plan Note (Signed)
 [place] pedis bilaterally, no interest in treatment

## 2023-12-28 LAB — COMPREHENSIVE METABOLIC PANEL WITH GFR
ALT: 22 IU/L (ref 0–32)
AST: 27 IU/L (ref 0–40)
Albumin: 4.4 g/dL (ref 3.8–4.9)
Alkaline Phosphatase: 92 IU/L (ref 49–135)
BUN/Creatinine Ratio: 24 — ABNORMAL HIGH (ref 9–23)
BUN: 16 mg/dL (ref 6–24)
Bilirubin Total: 0.8 mg/dL (ref 0.0–1.2)
CO2: 22 mmol/L (ref 20–29)
Calcium: 10.3 mg/dL — ABNORMAL HIGH (ref 8.7–10.2)
Chloride: 104 mmol/L (ref 96–106)
Creatinine, Ser: 0.66 mg/dL (ref 0.57–1.00)
Globulin, Total: 3 g/dL (ref 1.5–4.5)
Glucose: 92 mg/dL (ref 70–99)
Potassium: 4.1 mmol/L (ref 3.5–5.2)
Sodium: 142 mmol/L (ref 134–144)
Total Protein: 7.4 g/dL (ref 6.0–8.5)
eGFR: 104 mL/min/1.73 (ref >59)

## 2023-12-28 LAB — FIB-4 W/REFLEX TO ELF
FIB-4 Index: 1.28 (ref 0.00–2.67)
Platelets: 247 x10E3/uL (ref 150–450)

## 2023-12-28 LAB — LIPID PANEL
Chol/HDL Ratio: 1.7 ratio (ref 0.0–4.4)
Cholesterol, Total: 172 mg/dL (ref 100–199)
HDL: 101 mg/dL (ref >39)
LDL Chol Calc (NIH): 57 mg/dL (ref 0–99)
Triglycerides: 76 mg/dL (ref 0–149)
VLDL Cholesterol Cal: 14 mg/dL (ref 5–40)

## 2024-01-01 ENCOUNTER — Ambulatory Visit: Admitting: "Endocrinology

## 2024-01-09 ENCOUNTER — Encounter: Payer: Self-pay | Admitting: "Endocrinology

## 2024-01-09 ENCOUNTER — Ambulatory Visit: Admitting: "Endocrinology

## 2024-01-09 VITALS — BP 112/82 | HR 53 | Resp 18 | Ht 63.0 in | Wt 339.8 lb

## 2024-01-09 DIAGNOSIS — E1121 Type 2 diabetes mellitus with diabetic nephropathy: Secondary | ICD-10-CM

## 2024-01-09 DIAGNOSIS — E782 Mixed hyperlipidemia: Secondary | ICD-10-CM

## 2024-01-09 DIAGNOSIS — Z794 Long term (current) use of insulin: Secondary | ICD-10-CM | POA: Diagnosis not present

## 2024-01-09 DIAGNOSIS — I10 Essential (primary) hypertension: Secondary | ICD-10-CM

## 2024-01-09 LAB — POCT GLYCOSYLATED HEMOGLOBIN (HGB A1C)

## 2024-01-09 MED ORDER — TOUJEO MAX SOLOSTAR 300 UNIT/ML ~~LOC~~ SOPN: 60.0000 [IU] | PEN_INJECTOR | Freq: Every day | SUBCUTANEOUS | 1 refills | Status: DC

## 2024-01-09 MED ORDER — TIRZEPATIDE 5 MG/0.5ML ~~LOC~~ SOAJ: 5.0000 mg | SUBCUTANEOUS | 0 refills | Status: DC

## 2024-01-09 NOTE — Progress Notes (Signed)
 01/09/2024, 5:03 PM                                Endocrinology follow-up note   Subjective:    Patient ID: Isabella Matthews, female    DOB: 09-Nov-1970.  Isabella Matthews is being seen in follow-up for the management of currently controlled type 2 diabetes, hyperlipidemia, hypertension. PMD: [name], MD.   Past Medical History:  Diagnosis Date   Aortic stenosis, mild    Aortic valve disorders    Diabetes mellitus    Diabetes mellitus without complication (HCC)    Phreesia 02/08/2020   Edema    Essential hypertension    Hypertension    Phreesia 02/08/2020   Hypertension associated with type 2 diabetes mellitus (HCC) 03/05/2006   Qualifier: Diagnosis of   By: [name], [name]         Morbid obesity (HCC)    Onychomycosis of toenail     Past Surgical History:  Procedure Laterality Date   CESAREAN SECTION     x2    Social History   Socioeconomic History   Marital status: Single    Spouse name: Not on file   Number of children: Not on file   Years of education: Not on file   Highest education level: Not on file  Occupational History   Occupation: CNA - [employer]   Tobacco Use   Smoking status: Every Day    Current packs/day: 0.25    Average packs/day: 11.2 packs/day for 27.8 years (310.5 ttl pk-yrs)    Types: Cigarettes    Start date: 33    Last attempt to quit: 11/23/2014   Smokeless tobacco: Never  Vaping Use   Vaping status: Never Used  Substance and Sexual Activity   Alcohol use: Yes    Alcohol/week: 0.0 standard drinks of alcohol    Comment: occasionally   Drug use: No   Sexual activity: Not on file  Other Topics Concern   Not on file  Social History Narrative   Not on file   Social Drivers of Health   Tobacco Use: High Risk (01/09/2024)   Patient History    Smoking Tobacco Use: Every Day    Smokeless Tobacco Use: Never    Passive Exposure: Not on file  Financial Resource Strain: Not on file   Food Insecurity: Not on file  Transportation Needs: Not on file  Physical Activity: Not on file  Stress: Not on file  Social Connections: Not on file  Depression (PHQ2-9): Low Risk (12/13/2023)   Depression (PHQ2-9)    PHQ-2 Score: 1  Alcohol Screen: Not on file  Housing: Not on file  Utilities: Not on file  Health Literacy: Not on file    Family History  Problem Relation Age of Onset   Hypertension Mother    Hypertension Father    Diabetes Father     Outpatient Encounter Medications as of 01/09/2024  Medication Sig   albuterol (VENTOLIN HFA) 108 (90 Base) MCG/ACT inhaler Inhale 1-2 puffs into the lungs every 6 (six) hours as needed.   amLODipine  (NORVASC ) 10 MG tablet TAKE 1 TABLET BY MOUTH ONCE  DAILY   aspirin  EC 81 MG tablet Take 1 tablet (81 mg total) by mouth daily. Swallow whole.   busPIRone  (BUSPAR ) 7.5 MG tablet Take 1 tablet (7.5 mg total) by mouth 2 (two) times daily.   Cholecalciferol (  VITAMIN D3) 125 MCG (5000 UT) CAPS Take 1 capsule (5,000 Units total) by mouth daily.   gabapentin  (NEURONTIN ) 300 MG capsule TAKE 1 CAPSULE BY MOUTH AT  BEDTIME   glucose blood test strip 1 each by Other route 2 (two) times daily. Use as instructed   hydrALAZINE  (APRESOLINE ) 10 MG tablet TAKE 1 TABLET BY MOUTH 3 TIMES  DAILY   losartan  (COZAAR ) 100 MG tablet TAKE 1 TABLET BY MOUTH DAILY   Multiple Vitamin (MULTIVITAMIN) tablet Take 1 tablet by mouth daily.   rosuvastatin  (CRESTOR ) 20 MG tablet TAKE 1 TABLET BY MOUTH DAILY   tirzepatide  (MOUNJARO ) 5 MG/0.5ML Pen Inject 5 mg into the skin once a week.   [DISCONTINUED] tirzepatide  (MOUNJARO ) 2.5 MG/0.5ML Pen Inject 2.5 mg into the skin once a week.   [DISCONTINUED] TOUJEO  MAX SOLOSTAR 300 UNIT/ML Solostar Pen INJECT 70 UNITS INTO THE SKIN AT BEDTIME   insulin  glargine, 2 Unit Dial, (TOUJEO  MAX SOLOSTAR) 300 UNIT/ML Solostar Pen Inject 60 Units into the skin at bedtime.   No facility-administered encounter medications on file as of  01/09/2024.    ALLERGIES: Allergies  Allergen Reactions   Penicillins Swelling    Has patient had a PCN reaction causing immediate rash, facial/tongue/throat swelling, SOB or lightheadedness with hypotension: Yes Has patient had a PCN reaction causing severe rash involving mucus membranes or skin necrosis: No Has patient had a PCN reaction that required hospitalization: No Has patient had a PCN reaction occurring within the last 10 years: Yes If all of the above answers are NO, then may proceed with Cephalosporin use. Leg swelling/redness   Ace Inhibitors Cough   Metformin And Related Nausea Only   Tramadol  Rash    Scant rash on hands  After starting tramadol  for knee pain in Jan 16, 2013    VACCINATION STATUS: Immunization History  Administered Date(s) Administered   Hepb-cpg 12/13/2023   Influenza Split 10/27/2013   Influenza Whole 12/29/2005, 09/29/2009   Influenza, Seasonal, Injecte, Preservative Fre 10/24/2022   Influenza,inj,Quad PF,6+ Mos 10/17/2012, 10/24/2016, 10/01/2018, 10/08/2019, 03/03/2021, 10/11/2021   Influenza-Unspecified 11/10/2023   Moderna Sars-Covid-2 Vaccination 01/28/2019, 12/16/2019, 07/22/2020   PNEUMOCOCCAL CONJUGATE-20 05/03/2023   Pneumococcal Polysaccharide-23 01/12/2006, 07/09/2015   Tdap 08/12/2010, 09/08/2020   Zoster Recombinant(Shingrix) 07/23/2019, 10/08/2019    Diabetes She presents for her follow-up diabetic visit. She has type 2 diabetes mellitus. Onset time: She was diagnosed at approximate age of 51 years, she did have an episode of gestational diabetes. Her disease course has been improving. There are no hypoglycemic associated symptoms. Pertinent negatives for hypoglycemia include no confusion, headaches, pallor or seizures. Pertinent negatives for diabetes include no chest pain, no fatigue, no polydipsia, no polyphagia and no polyuria. There are no hypoglycemic complications. Symptoms are improving. Diabetic complications include  peripheral neuropathy. Risk factors for coronary artery disease include diabetes mellitus, dyslipidemia, family history, hypertension, obesity and sedentary lifestyle. Current diabetic treatment includes insulin  injections and oral agent (monotherapy). She is compliant with treatment most of the time. Her weight is decreasing steadily. She is following a generally unhealthy diet. When asked about meal planning, she reported none. She has not had a previous visit with a dietitian. She rarely participates in exercise. Her home blood glucose trend is decreasing steadily. Her breakfast blood glucose range is generally 110-130 mg/dl. Her bedtime blood glucose range is generally 130-140 mg/dl. Her overall blood glucose range is 130-140 mg/dl. Isabella Matthews presents with progressively improving glycemic profile.  Her point-of-care A1c is 6.6% improving from 7.4% and  at prior visit.  She did not document hypoglycemia.  She presents with 26 pounds of progressive weight loss.   ) An ACE inhibitor/angiotensin II receptor blocker is being taken. Eye exam is current.  Hyperlipidemia This is a chronic problem. The current episode started more than 1 year ago. The problem is controlled. Exacerbating diseases include diabetes and obesity. Pertinent negatives include no chest pain, myalgias or shortness of breath. Current antihyperlipidemic treatment includes statins. Risk factors for coronary artery disease include dyslipidemia, family history, obesity, hypertension, a sedentary lifestyle and diabetes mellitus.  Hypertension This is a chronic problem. The current episode started more than 1 year ago. Pertinent negatives include no chest pain, headaches, palpitations or shortness of breath. Risk factors for coronary artery disease include diabetes mellitus, dyslipidemia, obesity and sedentary lifestyle. Past treatments include angiotensin blockers.     Review of systems: Limited as above.  Objective:    BP 112/82   Pulse (!)  53   Resp 18   Ht 5' 3 (1.6 m)   Wt (!) 339 lb 12.8 oz (154.1 kg)   LMP  (LMP Unknown)   SpO2 99%   BMI 60.19 kg/m   Wt Readings from Last 3 Encounters:  01/09/24 (!) 339 lb 12.8 oz (154.1 kg)  12/13/23 (!) 338 lb 1.3 oz (153.4 kg)  09/04/23 (!) 342 lb 1.9 oz (155.2 kg)     Physical Exam- Limited  Constitutional:  Body mass index is 60.19 kg/m. , not in acute distress, normal state of mind    CMP ( most recent) CMP     Component Value Date/Time   NA 142 12/27/2023 1532   K 4.1 12/27/2023 1532   CL 104 12/27/2023 1532   CO2 22 12/27/2023 1532   GLUCOSE 92 12/27/2023 1532   GLUCOSE 138 (H) 06/19/2019 0721   BUN 16 12/27/2023 1532   CREATININE 0.66 12/27/2023 1532   CREATININE 0.87 06/19/2019 0721   CALCIUM  10.3 (H) 12/27/2023 1532   PROT 7.4 12/27/2023 1532   ALBUMIN 4.4 12/27/2023 1532   AST 27 12/27/2023 1532   ALT 22 12/27/2023 1532   ALKPHOS 92 12/27/2023 1532   BILITOT 0.8 12/27/2023 1532   GFRNONAA 70 02/02/2020 1128   GFRNONAA 101 02/14/2019 0848   GFRAA 81 02/02/2020 1128   GFRAA 117 02/14/2019 0848    Diabetic Labs (most recent): Lab Results  Component Value Date   HGBA1C 6.9 08/28/2023   HGBA1C 7.1 (A) 12/26/2022   HGBA1C 6.8 08/09/2022   MICROALBUR 30 09/01/2021   MICROALBUR 3.0 (H) 07/23/2019   MICROALBUR 33.3 01/27/2017     Lipid Panel ( most recent) Lipid Panel     Component Value Date/Time   CHOL 172 12/27/2023 1532   TRIG 76 12/27/2023 1532   HDL 101 12/27/2023 1532   CHOLHDL 1.7 12/27/2023 1532   CHOLHDL 2.2 07/23/2019 1010   VLDL 22 03/16/2016 0808   LDLCALC 57 12/27/2023 1532   LDLCALC 89 07/23/2019 1010   LABVLDL 14 12/27/2023 1532     Lab Results  Component Value Date   TSH 1.230 12/03/2023   TSH 1.090 05/03/2023   TSH 0.652 10/19/2022   TSH 0.993 01/19/2022   TSH 0.838 09/17/2020   TSH 0.98 06/19/2019   TSH 0.88 02/14/2019   TSH 0.96 01/27/2017   TSH 1.18 11/05/2015   TSH 1.603 07/10/2014   FREET4 1.36  09/17/2020   FREET4 1.2 06/19/2019   FREET4 1.2 02/14/2019     Assessment & Plan:  1. Type 2 diabetes mellitus with diabetic nephropathy, with long-term current use of insulin  (HCC)   - Isabella Matthews has currently uncontrolled symptomatic type 2 DM since  53 years of age.  Isabella Matthews presents with progressively improving glycemic profile.  Her point-of-care A1c is 6.6% improving from 7.4% and at prior visit.  She did not document hypoglycemia.  She presents with 26 pounds of progressive weight loss.    -Her recent labs were reviewed with her. - I had a long discussion with her about the progressive nature of diabetes and the pathology behind its complications. -her diabetes is complicated by neuropathy, obesity/sedentary life and she remains at a high risk for more acute and chronic complications which include CAD, CVA, CKD, retinopathy, and neuropathy. These are all discussed in detail with her.  - I have counseled her on diet  and weight management  by adopting a carbohydrate restricted/protein rich diet. Patient is encouraged to switch to  unprocessed or minimally processed     complex starch and increased protein intake (animal or plant source), fruits, and vegetables. -Admittedly, she has not engaged optimally to lifestyle medicine, however, she wishes to try again. - she acknowledges that there is a room for improvement in her food and drink choices. - Suggestion is made for her to avoid simple carbohydrates  from her diet including Cakes, Sweet Desserts, Ice Cream, Soda (diet and regular), Sweet Tea, Candies, Chips, Cookies, Store Bought Juices, Alcohol , Artificial Sweeteners,  Coffee Creamer, and Sugar-free Products, Lemonade. This will help patient to have more stable blood glucose profile and potentially avoid unintended weight gain.  The following Lifestyle Medicine recommendations according to American College of Lifestyle Medicine  [facility]) were discussed and and offered to  patient and she  agrees to start the journey:  A. Whole Foods, Plant-Based Nutrition comprising of fruits and vegetables, plant-based proteins, whole-grain carbohydrates was discussed in detail with the patient.   A list for source of those nutrients were also provided to the patient.  Patient will use only water or unsweetened tea for hydration. B.  The need to stay away from risky substances including alcohol, smoking; obtaining 7 to 9 hours of restorative sleep, at least 150 minutes of moderate intensity exercise weekly, the importance of healthy social connections,  and stress management techniques were discussed. C.  A full color page of  Calorie density of various food groups per pound showing examples of each food groups was provided to the patient.  - I have approached her with the following individualized plan to manage  her diabetes and patient agrees:   In light of her presentation with near target glycemic profile, she will not need prandial insulin  treatment for now.    - She is benefiting from the GLP-1 receptor agonist.  I discussed and increase her Mounjaro  to 5 mg subcutaneously weekly.   Side effects and precaution discussed with her. I advised her to lower her Toujeo  to 60 units nightly. -She is willing and advised to continue monitoring blood glucose twice a day-daily before breakfast and at bedtime.  - she is warned not to take insulin  without proper monitoring per orders. - she is encouraged to call clinic for blood glucose levels less than 70 or above 200 mg /dl.  -She reports GI intolerance from metformin.     - Specific targets for  A1c;  LDL, HDL, Triglycerides, and  Waist Circumference were discussed with the patient.  2) Blood Pressure /Hypertension:  Her blood  pressure is controlled to target.  She is advised to continue losartan  100 mg p.o. daily, amlodipine  5 mg p.o. daily at breakfast.  She also has hydralazine  10 mg p.o. 3 times daily.  The above detailed  lifestyle nutrition will help with hypertension and hyperlipidemia.  3) Lipids/Hyperlipidemia: She presents with controlled lipid panel with LDL at 57.  She is advised to continue Crestor  20 mg p.o. nightly.     Whole food plant-based diet will help with dyslipidemia as well, however her engagement is suboptimal.    4)  Weight/Diet: Her BMI is 60.19 kg/m -drop in blood still high clearly complicating her diabetes care.   she is  a candidate for weight loss. I discussed with her the fact that loss of 5 - 10% of her  current body weight will have the most impact on her diabetes management.  Exercise, and detailed carbohydrates information provided  -  detailed on discharge instructions.  She is a perfect candidate for whole food plant-based diet described above.  She is also a good candidate for bariatric surgery, however she hesitates to consider this option at this time.    5) vitamin D  deficiency She is on ongoing supplement with vitamin D3 5000 units daily.  She is now vitamin D  replete at 46.  She is advised to continue.  6) Chronic Care/Health Maintenance:  -she  is on ACEI/ARB and Statin medications and  is encouraged to initiate and continue to follow up with Ophthalmology, Dentist,  Podiatrist at least yearly or according to recommendations, and advised to   stay away from smoking. I have recommended yearly flu vaccine and pneumonia vaccine at least every 5 years; moderate intensity exercise for up to 150 minutes weekly; and  sleep for at least 7 hours a day.   The patient was counseled on the dangers of tobacco use, and was advised to quit.  Reviewed strategies to maximize success, including removing cigarettes and smoking materials from environment.  - she is  advised to maintain close follow up with [name], MD for primary care needs, as well as her other providers for optimal and coordinated care.   I spent  26  minutes in the care of the patient today including review of  labs from CMP, Lipids, Thyroid Function, Hematology (current and previous including abstractions from other facilities); face-to-face time discussing  her blood glucose readings/logs, discussing hypoglycemia and hyperglycemia episodes and symptoms, medications doses, her options of short and long term treatment based on the latest standards of care / guidelines;  discussion about incorporating lifestyle medicine;  and documenting the encounter. Risk reduction counseling performed per USPSTF guidelines to reduce  obesity and cardiovascular risk factors.     Please refer to Patient Instructions for Blood Glucose Monitoring and Insulin /Medications Dosing Guide  in media tab for additional information. Please  also refer to  Patient Self Inventory in the Media  tab for reviewed elements of pertinent patient history.  Isabella Matthews participated in the discussions, expressed understanding, and voiced agreement with the above plans.  All questions were answered to her satisfaction. she is encouraged to contact clinic should she have any questions or concerns prior to her return visit.    Follow up plan: - Return in about 3 months (around 04/08/2024) for Bring Meter/CGM Device/Logs- A1c in Office.  Ranny Earl, MD [facility] [address] [place], [place] [address] Phone: [phone]  Fax: [phone]    01/09/2024, 5:03  PM  This note was partially dictated with voice recognition software. Similar sounding words can be transcribed inadequately or may not  be corrected upon review.

## 2024-01-09 NOTE — Patient Instructions (Signed)

## 2024-01-29 ENCOUNTER — Other Ambulatory Visit: Payer: Self-pay | Admitting: "Endocrinology

## 2024-02-22 ENCOUNTER — Other Ambulatory Visit: Payer: Self-pay

## 2024-02-22 DIAGNOSIS — E1121 Type 2 diabetes mellitus with diabetic nephropathy: Secondary | ICD-10-CM

## 2024-02-22 MED ORDER — TOUJEO MAX SOLOSTAR 300 UNIT/ML ~~LOC~~ SOPN: 60.0000 [IU] | PEN_INJECTOR | Freq: Every day | SUBCUTANEOUS | 0 refills | Status: AC

## 2024-02-22 MED ORDER — MOUNJARO 5 MG/0.5ML ~~LOC~~ SOAJ: 5.0000 mg | SUBCUTANEOUS | 0 refills | Status: AC

## 2024-04-14 ENCOUNTER — Ambulatory Visit: Admitting: "Endocrinology

## 2024-05-15 ENCOUNTER — Encounter: Admitting: Family Medicine
